# Patient Record
Sex: Male | Born: 1955 | Race: White | Hispanic: No | Smoking: Former smoker
Health system: Southern US, Community
[De-identification: ages and names within clinical notes are randomized; demographics above are authoritative.]

## PROBLEM LIST (undated history)

## (undated) ENCOUNTER — Inpatient Hospital Stay (HOSPITAL_BASED_OUTPATIENT_CLINIC_OR_DEPARTMENT_OTHER): Payer: Medicare Other | Admitting: Podiatry

## (undated) DIAGNOSIS — G8929 Other chronic pain: Secondary | ICD-10-CM

## (undated) DIAGNOSIS — Z87442 Personal history of urinary calculi: Secondary | ICD-10-CM

## (undated) DIAGNOSIS — Z227 Latent tuberculosis: Secondary | ICD-10-CM

## (undated) DIAGNOSIS — F32A Depression, unspecified: Secondary | ICD-10-CM

## (undated) DIAGNOSIS — F419 Anxiety disorder, unspecified: Secondary | ICD-10-CM

## (undated) DIAGNOSIS — Z9889 Other specified postprocedural states: Secondary | ICD-10-CM

## (undated) DIAGNOSIS — R4182 Altered mental status, unspecified: Secondary | ICD-10-CM

## (undated) DIAGNOSIS — T8859XA Other complications of anesthesia, initial encounter: Secondary | ICD-10-CM

## (undated) DIAGNOSIS — B1920 Unspecified viral hepatitis C without hepatic coma: Secondary | ICD-10-CM

## (undated) DIAGNOSIS — I10 Essential (primary) hypertension: Secondary | ICD-10-CM

## (undated) DIAGNOSIS — N200 Calculus of kidney: Secondary | ICD-10-CM

## (undated) DIAGNOSIS — E119 Type 2 diabetes mellitus without complications: Secondary | ICD-10-CM

## (undated) DIAGNOSIS — R112 Nausea with vomiting, unspecified: Secondary | ICD-10-CM

## (undated) DIAGNOSIS — K219 Gastro-esophageal reflux disease without esophagitis: Secondary | ICD-10-CM

## (undated) DIAGNOSIS — F1010 Alcohol abuse, uncomplicated: Secondary | ICD-10-CM

## (undated) DIAGNOSIS — M1990 Unspecified osteoarthritis, unspecified site: Secondary | ICD-10-CM

## (undated) DIAGNOSIS — D849 Immunodeficiency, unspecified: Secondary | ICD-10-CM

## (undated) HISTORY — PX: WRIST SURGERY: SHX841

## (undated) HISTORY — PX: EYE SURGERY: SHX253

## (undated) HISTORY — DX: Altered mental status, unspecified: R41.82

## (undated) HISTORY — PX: ABDOMINAL SURGERY: SHX537

## (undated) HISTORY — PX: NECK SURGERY: SHX720

## (undated) HISTORY — PX: CHOLECYSTECTOMY: SHX55

## (undated) HISTORY — PX: ELBOW SURGERY: SHX618

## (undated) HISTORY — PX: ANKLE SURGERY: SHX546

## (undated) HISTORY — PX: HERNIA REPAIR: SHX51

## (undated) NOTE — *Deleted (*Deleted)
NEUROLOGY CONSULTATION NOTE  Raymond Melton MRN: [id number] DOB: 10-17-1956  Referring provider: Angelina Pih, MD Primary care provider: Angelina Pih, MD  Reason for consult:  TIA, memory loss   Subjective:  Raymond Melton is a 19 year old right-handed male with hepatitis C, substance abuse, HTN and diabetes who presents for memory deficits and TIA.  History supplemented by prior neurology notes.  Longstanding history of alcohol and opiate abuse for which he has been through rehab.  He had C5-C7 cervical spine fusion in 2018.  He reports cognitive impairment following the surgery.  He has ***. He also reported intermittent episodes of confusion with associated slurred speech and gait instability, lasting anywhere from 2 hours to 2 days.  He is a diabetic and blood sugars reportedly normal during these events.  He has undergone several brain MRIs which have showed mild chronic small vessel ischemic changes but no acute intracranial abnormalities.  MRA of head and neck from 12/25/2017 personally reviewed was negative.  EEG from 01/06/2018 was normal.  Even though he had reported no benzodiazepine use, he apparently had continued to take Xanax.  His last neurologist determined that his episodic confusion was likely related to benzodiazepine use.       PAST MEDICAL HISTORY: Past Medical History:  Diagnosis Date  . Alcohol abuse   . Altered mental status 12/17/2017  . Chronic pain   . Diabetes mellitus without complication (HCC)   . Hepatitis C   . Hypertension   . Immune deficiency disorder (HCC)   . Kidney stones   . TB lung, latent     PAST SURGICAL HISTORY: Past Surgical History:  Procedure Laterality Date  . ABDOMINAL SURGERY    . ANKLE SURGERY    . CHOLECYSTECTOMY    . CORONARY STENT INTERVENTION N/A 02/05/2018   Procedure: CORONARY STENT INTERVENTION;  Surgeon: Lyn Records, MD;  Location: [facility] INVASIVE CV LAB;  Service: Cardiovascular;  Laterality: N/A;  . ELBOW SURGERY     . EYE SURGERY    . HERNIA REPAIR    . INTRAVASCULAR PRESSURE WIRE/FFR STUDY N/A 02/05/2018   Procedure: INTRAVASCULAR PRESSURE WIRE/FFR STUDY;  Surgeon: Lyn Records, MD;  Location: [facility] INVASIVE CV LAB;  Service: Cardiovascular;  Laterality: N/A;  . LEFT HEART CATH AND CORONARY ANGIOGRAPHY N/A 02/05/2018   Procedure: LEFT HEART CATH AND CORONARY ANGIOGRAPHY;  Surgeon: Lyn Records, MD;  Location: [facility] INVASIVE CV LAB;  Service: Cardiovascular;  Laterality: N/A;  . WRIST SURGERY      MEDICATIONS: Current Outpatient Medications on File Prior to Visit  Medication Sig Dispense Refill  . amLODipine (NORVASC) 10 MG tablet Take 10 mg by mouth daily.    . clopidogrel (PLAVIX) 75 MG tablet Take 1 tablet (75 mg total) by mouth daily. 90 tablet 3  . isosorbide mononitrate (IMDUR) 30 MG 24 hr tablet Take 1 tablet (30 mg total) by mouth daily. 90 tablet 3  . metoprolol succinate (TOPROL-XL) 100 MG 24 hr tablet Take 1 tablet (100 mg total) by mouth daily. Take with or immediately following a meal: For high blood pressure    . nitroGLYCERIN (NITROSTAT) 0.4 MG SL tablet Place 1 tablet (0.4 mg total) under the tongue every 5 (five) minutes x 3 doses as needed for chest pain. 30 tablet 12  . rosuvastatin (CRESTOR) 20 MG tablet Take 1 tablet (20 mg total) by mouth daily. 90 tablet 3   No current facility-administered medications on file prior to visit.  ALLERGIES: Allergies  Allergen Reactions  . Iodinated Diagnostic Agents Anaphylaxis and Swelling  . Iodine Anaphylaxis  . Ioxaglate Anaphylaxis and Swelling  . Metrizamide Anaphylaxis and Swelling  . Other Anaphylaxis, Shortness Of Breath and Swelling    Iv dye  . Codeine Itching and Other (See Comments)    Other reaction: restless  . Iohexol Other (See Comments)  . Latex Other (See Comments)    Unknown reaction  . Lisinopril Other (See Comments)    Unknown reaction  . Tuberculin Other (See Comments)    Other reaction: Severe skin reaction  .  Tuberculin Tests Other (See Comments)    Severe skin reaction  . Valsartan Other (See Comments)    Unknown reaction  . Zolpidem Other (See Comments)    confusion    FAMILY HISTORY: Family History  Problem Relation Age of Onset  . Depression Mother   . Hypertension Mother   . Leukemia Mother   . Stroke Father   . Hypertension Father   . Heart attack Father   . Cancer Sister   . Hypertension Sister   . Cancer Brother        bladder  . Hypertension Brother   . Heart attack Brother    ***.  SOCIAL HISTORY: Social History   Socioeconomic History  . Marital status: Legally Separated    Spouse name: Not on file  . Number of children: Not on file  . Years of education: Not on file  . Highest education level: Not on file  Occupational History  . Not on file  Tobacco Use  . Smoking status: Current Some Day Smoker    Packs/day: 0.50    Types: Cigarettes  . Smokeless tobacco: Never Used  Substance and Sexual Activity  . Alcohol use: Yes    Comment: daily  . Drug use: No  . Sexual activity: Not Currently  Other Topics Concern  . Not on file  Social History Narrative  . Not on file   Social Determinants of Health   Financial Resource Strain:   . Difficulty of Paying Living Expenses: Not on file  Food Insecurity:   . Worried About Programme researcher, broadcasting/film/video in the Last Year: Not on file  . Ran Out of Food in the Last Year: Not on file  Transportation Needs:   . Lack of Transportation (Medical): Not on file  . Lack of Transportation (Non-Medical): Not on file  Physical Activity:   . Days of Exercise per Week: Not on file  . Minutes of Exercise per Session: Not on file  Stress:   . Feeling of Stress : Not on file  Social Connections:   . Frequency of Communication with Friends and Family: Not on file  . Frequency of Social Gatherings with Friends and Family: Not on file  . Attends Religious Services: Not on file  . Active Member of Clubs or Organizations: Not on file   . Attends Banker Meetings: Not on file  . Marital Status: Not on file  Intimate Partner Violence:   . Fear of Current or Ex-Partner: Not on file  . Emotionally Abused: Not on file  . Physically Abused: Not on file  . Sexually Abused: Not on file    Objective:  *** General: No acute distress.  Patient appears well-groomed.   Head:  Normocephalic/atraumatic Eyes:  fundi examined but not visualized Neck: supple, no paraspinal tenderness, full range of motion Back: No paraspinal tenderness Heart: regular rate and rhythm Lungs: Clear  to auscultation bilaterally. Vascular: No carotid bruits. Neurological Exam: Mental status: alert and oriented to person, place, and time, recent and remote memory intact, fund of knowledge intact, attention and concentration intact, speech fluent and not dysarthric, language intact. Cranial nerves: CN I: not tested CN II: pupils equal, round and reactive to light, visual fields intact CN III, IV, VI:  full range of motion, no nystagmus, no ptosis CN V: facial sensation intact. CN VII: upper and lower face symmetric CN VIII: hearing intact CN IX, X: gag intact, uvula midline CN XI: sternocleidomastoid and trapezius muscles intact CN XII: tongue midline Bulk & Tone: normal, no fasciculations. Motor:  muscle strength 5/5 throughout Sensation:  Pinprick, temperature and vibratory sensation intact. Deep Tendon Reflexes:  2+ throughout,  toes downgoing.   Finger to nose testing:  Without dysmetria.   Heel to shin:  Without dysmetria.   Gait:  Normal station and stride.  Romberg negative.  Assessment/Plan:   ***    Thank you for allowing me to take part in the care of this patient.  Shon Millet, DO  CC: ***

---

## 2000-07-12 ENCOUNTER — Encounter: Payer: Self-pay | Admitting: Emergency Medicine

## 2000-07-12 ENCOUNTER — Emergency Department (HOSPITAL_COMMUNITY): Admission: EM | Admit: 2000-07-12 | Discharge: 2000-07-12 | Payer: Self-pay | Admitting: *Deleted

## 2000-07-30 ENCOUNTER — Ambulatory Visit (HOSPITAL_COMMUNITY): Admission: RE | Admit: 2000-07-30 | Discharge: 2000-07-30 | Payer: Self-pay | Admitting: Orthopedic Surgery

## 2000-07-30 ENCOUNTER — Encounter: Payer: Self-pay | Admitting: Orthopedic Surgery

## 2012-06-05 ENCOUNTER — Other Ambulatory Visit (HOSPITAL_COMMUNITY): Payer: Self-pay | Admitting: Sports Medicine

## 2012-06-05 DIAGNOSIS — M25571 Pain in right ankle and joints of right foot: Secondary | ICD-10-CM

## 2012-12-01 DIAGNOSIS — A199 Miliary tuberculosis, unspecified: Secondary | ICD-10-CM | POA: Insufficient documentation

## 2013-01-13 ENCOUNTER — Encounter (HOSPITAL_COMMUNITY): Payer: Self-pay | Admitting: Emergency Medicine

## 2013-01-13 ENCOUNTER — Emergency Department (HOSPITAL_COMMUNITY)
Admission: EM | Admit: 2013-01-13 | Discharge: 2013-01-13 | Disposition: A | Discharge status: Home / Self Care | Payer: BC Managed Care – PPO | Attending: Emergency Medicine | Admitting: Emergency Medicine

## 2013-01-13 DIAGNOSIS — Z87442 Personal history of urinary calculi: Secondary | ICD-10-CM | POA: Insufficient documentation

## 2013-01-13 DIAGNOSIS — Z8639 Personal history of other endocrine, nutritional and metabolic disease: Secondary | ICD-10-CM | POA: Insufficient documentation

## 2013-01-13 DIAGNOSIS — Z79899 Other long term (current) drug therapy: Secondary | ICD-10-CM | POA: Insufficient documentation

## 2013-01-13 DIAGNOSIS — F1990 Other psychoactive substance use, unspecified, uncomplicated: Secondary | ICD-10-CM

## 2013-01-13 DIAGNOSIS — I10 Essential (primary) hypertension: Secondary | ICD-10-CM | POA: Insufficient documentation

## 2013-01-13 DIAGNOSIS — F1910 Other psychoactive substance abuse, uncomplicated: Secondary | ICD-10-CM | POA: Insufficient documentation

## 2013-01-13 DIAGNOSIS — Z8619 Personal history of other infectious and parasitic diseases: Secondary | ICD-10-CM | POA: Insufficient documentation

## 2013-01-13 DIAGNOSIS — E119 Type 2 diabetes mellitus without complications: Secondary | ICD-10-CM | POA: Insufficient documentation

## 2013-01-13 DIAGNOSIS — Z87891 Personal history of nicotine dependence: Secondary | ICD-10-CM | POA: Insufficient documentation

## 2013-01-13 DIAGNOSIS — Z862 Personal history of diseases of the blood and blood-forming organs and certain disorders involving the immune mechanism: Secondary | ICD-10-CM | POA: Insufficient documentation

## 2013-01-13 DIAGNOSIS — Z8611 Personal history of tuberculosis: Secondary | ICD-10-CM | POA: Insufficient documentation

## 2013-01-13 DIAGNOSIS — F1010 Alcohol abuse, uncomplicated: Secondary | ICD-10-CM | POA: Insufficient documentation

## 2013-01-13 DIAGNOSIS — F329 Major depressive disorder, single episode, unspecified: Secondary | ICD-10-CM

## 2013-01-13 HISTORY — DX: Type 2 diabetes mellitus without complications: E11.9

## 2013-01-13 HISTORY — DX: Latent tuberculosis: Z22.7

## 2013-01-13 HISTORY — DX: Unspecified viral hepatitis C without hepatic coma: B19.20

## 2013-01-13 HISTORY — DX: Calculus of kidney: N20.0

## 2013-01-13 HISTORY — DX: Immunodeficiency, unspecified: D84.9

## 2013-01-13 HISTORY — DX: Essential (primary) hypertension: I10

## 2013-01-13 LAB — COMPREHENSIVE METABOLIC PANEL
ALT: 94 U/L — ABNORMAL HIGH (ref 0–53)
AST: 52 U/L — ABNORMAL HIGH (ref 0–37)
Albumin: 3.6 g/dL (ref 3.5–5.2)
Alkaline Phosphatase: 202 U/L — ABNORMAL HIGH (ref 39–117)
BUN: 20 mg/dL (ref 6–23)
CO2: 27 mEq/L (ref 19–32)
Calcium: 9.3 mg/dL (ref 8.4–10.5)
Chloride: 94 mEq/L — ABNORMAL LOW (ref 96–112)
Creatinine, Ser: 0.69 mg/dL (ref 0.50–1.35)
GFR calc Af Amer: >90 mL/min (ref >90)
GFR calc non Af Amer: >90 mL/min (ref >90)
Glucose, Bld: 260 mg/dL — ABNORMAL HIGH (ref 70–99)
Potassium: 4 mEq/L (ref 3.5–5.1)
Sodium: 133 mEq/L — ABNORMAL LOW (ref 135–145)
Total Bilirubin: 0.7 mg/dL (ref 0.3–1.2)
Total Protein: 7.2 g/dL (ref 6.0–8.3)

## 2013-01-13 LAB — CBC
HCT: 44.7 % (ref 39.0–52.0)
Hemoglobin: 15.8 g/dL (ref 13.0–17.0)
MCH: 33.3 pg (ref 26.0–34.0)
MCHC: 35.3 g/dL (ref 30.0–36.0)
MCV: 94.3 fL (ref 78.0–100.0)
Platelets: 244 10*3/uL (ref 150–400)
RBC: 4.74 MIL/uL (ref 4.22–5.81)
RDW: 12.4 % (ref 11.5–15.5)
WBC: 8.7 10*3/uL (ref 4.0–10.5)

## 2013-01-13 LAB — RAPID URINE DRUG SCREEN, HOSP PERFORMED
Amphetamines: NOT DETECTED
Barbiturates: NOT DETECTED
Benzodiazepines: NOT DETECTED
Cocaine: NOT DETECTED
Opiates: NOT DETECTED
Tetrahydrocannabinol: NOT DETECTED

## 2013-01-13 LAB — ETHANOL: Alcohol, Ethyl (B): <11 mg/dL (ref 0–11)

## 2013-01-13 LAB — SALICYLATE LEVEL: Salicylate Lvl: <2 mg/dL — ABNORMAL LOW (ref 2.8–20.0)

## 2013-01-13 LAB — ACETAMINOPHEN LEVEL: Acetaminophen (Tylenol), Serum: <15 ug/mL (ref 10–30)

## 2013-01-13 MED ORDER — IBUPROFEN 600 MG PO TABS: 600.0000 mg | ORAL_TABLET | Freq: Three times a day (TID) | ORAL | Status: DC | PRN

## 2013-01-13 MED ORDER — VITAMIN B-1 100 MG PO TABS
100.0000 mg | ORAL_TABLET | Freq: Every day | ORAL | Status: DC
  Administered 2013-01-13: 100 mg via ORAL
  Filled 2013-01-13: qty 1

## 2013-01-13 MED ORDER — ALUM & MAG HYDROXIDE-SIMETH 200-200-20 MG/5ML PO SUSP: 30.0000 mL | ORAL | Status: DC | PRN

## 2013-01-13 MED ORDER — FOLIC ACID 1 MG PO TABS
1.0000 mg | ORAL_TABLET | Freq: Every day | ORAL | Status: DC
  Administered 2013-01-13: 1 mg via ORAL
  Filled 2013-01-13: qty 1

## 2013-01-13 MED ORDER — ADULT MULTIVITAMIN W/MINERALS CH
1.0000 | ORAL_TABLET | Freq: Every day | ORAL | Status: DC
  Administered 2013-01-13: 1 via ORAL
  Filled 2013-01-13: qty 1

## 2013-01-13 MED ORDER — LORAZEPAM 1 MG PO TABS: 1.0000 mg | ORAL_TABLET | Freq: Four times a day (QID) | ORAL | Status: DC | PRN

## 2013-01-13 MED ORDER — ZOLPIDEM TARTRATE 5 MG PO TABS: 5.0000 mg | ORAL_TABLET | Freq: Every evening | ORAL | Status: DC | PRN

## 2013-01-13 MED ORDER — LORAZEPAM 2 MG/ML IJ SOLN: 1.0000 mg | Freq: Four times a day (QID) | INTRAMUSCULAR | Status: DC | PRN

## 2013-01-13 MED ORDER — ONDANSETRON HCL 4 MG PO TABS: 4.0000 mg | ORAL_TABLET | Freq: Three times a day (TID) | ORAL | Status: DC | PRN

## 2013-01-13 MED ORDER — THIAMINE HCL 100 MG/ML IJ SOLN: 100.0000 mg | Freq: Every day | INTRAMUSCULAR | Status: DC

## 2013-01-13 NOTE — [facility] Suicide Risk Assessment (Signed)
Suicide Risk Assessment  Discharge Assessment     Demographic Factors:  Male, Adolescent or young adult and Caucasian  Mental Status Per Nursing Assessment::   On Admission:     Current Mental Status by Physician: NA  Loss Factors: Financial problems/change in socioeconomic status  Historical Factors: Family history of mental illness or substance abuse and Impulsivity  Risk Reduction Factors:   Sense of responsibility to family, Religious beliefs about death, Employed, Living with another person, especially a relative, Positive social support and Positive therapeutic relationship  Continued Clinical Symptoms:  Alcohol/Substance Abuse/Dependencies  Cognitive Features That Contribute To Risk:  Polarized thinking    Suicide Risk:  Minimal: No identifiable suicidal ideation.  Patients presenting with no risk factors but with morbid ruminations; may be classified as minimal risk based on the severity of the depressive symptoms  Discharge Diagnoses:   AXIS I:  Alcohol Abuse and Depressive Disorder NOS AXIS II:  Deferred AXIS III:   Past Medical History  Diagnosis Date  . Hepatitis C   . Diabetes mellitus without complication   . Hypertension   . TB lung, latent   . Kidney stones   . Immune deficiency disorder    AXIS IV:  other psychosocial or environmental problems and problems related to social environment AXIS V:  41-50 serious symptoms  Plan Of Care/Follow-up recommendations:  Activity:  As  tolerated Diet:  Regular  Is patient on multiple antipsychotic therapies at discharge:  No   Has Patient had three or more failed trials of antipsychotic monotherapy by history:  No  Recommended Plan for Multiple Antipsychotic Therapies: Not applicable  Raymond Melton,Raymond R. 01/13/2013, 4:52 PM
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
## 2013-01-13 NOTE — ED Notes (Signed)
States that he needs detox from alcohol and prescription medication. Wife states that he is taking whatever he can and it is mostly Ativan. Wife states that he drinks from the time he gets home from work until whenever he stops. States that he drinks beer and vodka. Pt states that he takes the Ativan to keep from going through the withdraw states that he has taken more then 2 Ativan 2mg . States that he also abuses OTC medications as well. States that he takes anything with "PM" in it.

## 2013-02-15 ENCOUNTER — Emergency Department (HOSPITAL_COMMUNITY)
Admission: EM | Admit: 2013-02-15 | Discharge: 2013-02-15 | Disposition: A | Discharge status: Home / Self Care | Payer: BC Managed Care – PPO | Attending: Emergency Medicine | Admitting: Emergency Medicine

## 2013-02-15 ENCOUNTER — Emergency Department (HOSPITAL_COMMUNITY): Payer: BC Managed Care – PPO

## 2013-02-15 ENCOUNTER — Encounter (HOSPITAL_COMMUNITY): Payer: Self-pay | Admitting: *Deleted

## 2013-02-15 DIAGNOSIS — E875 Hyperkalemia: Secondary | ICD-10-CM | POA: Insufficient documentation

## 2013-02-15 DIAGNOSIS — Z8639 Personal history of other endocrine, nutritional and metabolic disease: Secondary | ICD-10-CM | POA: Insufficient documentation

## 2013-02-15 DIAGNOSIS — Z8619 Personal history of other infectious and parasitic diseases: Secondary | ICD-10-CM | POA: Insufficient documentation

## 2013-02-15 DIAGNOSIS — R1011 Right upper quadrant pain: Secondary | ICD-10-CM | POA: Insufficient documentation

## 2013-02-15 DIAGNOSIS — I10 Essential (primary) hypertension: Secondary | ICD-10-CM | POA: Insufficient documentation

## 2013-02-15 DIAGNOSIS — Z862 Personal history of diseases of the blood and blood-forming organs and certain disorders involving the immune mechanism: Secondary | ICD-10-CM | POA: Insufficient documentation

## 2013-02-15 DIAGNOSIS — Z87442 Personal history of urinary calculi: Secondary | ICD-10-CM | POA: Insufficient documentation

## 2013-02-15 DIAGNOSIS — E119 Type 2 diabetes mellitus without complications: Secondary | ICD-10-CM | POA: Insufficient documentation

## 2013-02-15 DIAGNOSIS — Z9089 Acquired absence of other organs: Secondary | ICD-10-CM | POA: Insufficient documentation

## 2013-02-15 DIAGNOSIS — Z794 Long term (current) use of insulin: Secondary | ICD-10-CM | POA: Insufficient documentation

## 2013-02-15 DIAGNOSIS — Z79899 Other long term (current) drug therapy: Secondary | ICD-10-CM | POA: Insufficient documentation

## 2013-02-15 DIAGNOSIS — Z87891 Personal history of nicotine dependence: Secondary | ICD-10-CM | POA: Insufficient documentation

## 2013-02-15 HISTORY — DX: Alcohol abuse, uncomplicated: F10.10

## 2013-02-15 LAB — POCT I-STAT, CHEM 8
Calcium, Ion: 1.2 mmol/L (ref 1.12–1.23)
Glucose, Bld: 219 mg/dL — ABNORMAL HIGH (ref 70–99)
HCT: 45 % (ref 39.0–52.0)
Hemoglobin: 15.3 g/dL (ref 13.0–17.0)
Potassium: 5.9 mEq/L — ABNORMAL HIGH (ref 3.5–5.1)
TCO2: 31 mmol/L (ref 0–100)

## 2013-02-15 LAB — URINALYSIS, ROUTINE W REFLEX MICROSCOPIC
Leukocytes, UA: NEGATIVE
Protein, ur: NEGATIVE mg/dL
Urobilinogen, UA: 0.2 mg/dL (ref 0.0–1.0)

## 2013-02-15 LAB — COMPREHENSIVE METABOLIC PANEL
ALT: 60 U/L — ABNORMAL HIGH (ref 0–53)
AST: 73 U/L — ABNORMAL HIGH (ref 0–37)
CO2: 27 mEq/L (ref 19–32)
Calcium: 9.7 mg/dL (ref 8.4–10.5)
Chloride: 98 mEq/L (ref 96–112)
GFR calc non Af Amer: >90 mL/min (ref >90)
Sodium: 134 mEq/L — ABNORMAL LOW (ref 135–145)

## 2013-02-15 LAB — CBC WITH DIFFERENTIAL/PLATELET
Basophils Absolute: 0 10*3/uL (ref 0.0–0.1)
Eosinophils Relative: 2 % (ref 0–5)
Lymphocytes Relative: 29 % (ref 12–46)
Neutro Abs: 2.8 10*3/uL (ref 1.7–7.7)
Neutrophils Relative %: 60 % (ref 43–77)
Platelets: 177 10*3/uL (ref 150–400)
RDW: 12.5 % (ref 11.5–15.5)
WBC: 4.7 10*3/uL (ref 4.0–10.5)

## 2013-02-15 LAB — URINE MICROSCOPIC-ADD ON

## 2013-02-15 LAB — GLUCOSE, CAPILLARY
Glucose-Capillary: 254 mg/dL — ABNORMAL HIGH (ref 70–99)
Glucose-Capillary: 181 mg/dL — ABNORMAL HIGH (ref 70–99)

## 2013-02-15 MED ORDER — KETAMINE HCL 10 MG/ML IJ SOLN
9.0000 mg | Freq: Once | INTRAMUSCULAR | Status: AC
  Administered 2013-02-15: 9 mg via INTRAVENOUS

## 2013-02-15 MED ORDER — TRAMADOL HCL 50 MG PO TABS: 50.0000 mg | ORAL_TABLET | Freq: Three times a day (TID) | ORAL | Status: DC | PRN

## 2013-02-15 MED ORDER — GI COCKTAIL ~~LOC~~
30.0000 mL | Freq: Once | ORAL | Status: AC
  Administered 2013-02-15: 30 mL via ORAL
  Filled 2013-02-15: qty 30

## 2013-02-15 MED ORDER — ONDANSETRON HCL 4 MG/2ML IJ SOLN
4.0000 mg | Freq: Once | INTRAMUSCULAR | Status: AC
  Administered 2013-02-15: 4 mg via INTRAVENOUS
  Filled 2013-02-15: qty 2

## 2013-02-15 MED ORDER — KETAMINE HCL 10 MG/ML IJ SOLN
13.0000 mg | Freq: Once | INTRAMUSCULAR | Status: AC
  Administered 2013-02-15: 13 mg via INTRAVENOUS

## 2013-02-15 MED ORDER — HYDROMORPHONE HCL PF 1 MG/ML IJ SOLN: 1.0000 mg | INTRAMUSCULAR | Status: DC

## 2013-02-15 MED ORDER — INSULIN ASPART 100 UNIT/ML ~~LOC~~ SOLN
5.0000 [IU] | Freq: Once | SUBCUTANEOUS | Status: AC
  Administered 2013-02-15: 5 [IU] via INTRAVENOUS
  Filled 2013-02-15: qty 1

## 2013-02-15 MED ORDER — TRAMADOL HCL 50 MG PO TABS
100.0000 mg | ORAL_TABLET | Freq: Once | ORAL | Status: AC
  Administered 2013-02-15: 100 mg via ORAL
  Filled 2013-02-15: qty 2

## 2013-02-15 MED ORDER — KETAMINE HCL 10 MG/ML IJ SOLN
9.0000 mg | Freq: Once | INTRAMUSCULAR | Status: AC
  Administered 2013-02-15: 9 mg via INTRAVENOUS
  Filled 2013-02-15: qty 0.9

## 2013-02-15 MED ORDER — LACTATED RINGERS IV BOLUS (SEPSIS)
1000.0000 mL | Freq: Once | INTRAVENOUS | Status: AC
  Administered 2013-02-15: 1000 mL via INTRAVENOUS

## 2013-02-15 NOTE — ED Notes (Signed)
EKG shown to Dr. Denton Lank

## 2013-02-15 NOTE — ED Provider Notes (Signed)
History     CSN: [id number]  Arrival date & time 02/15/13  0032   First MD Initiated Contact with Patient 02/15/13 [phone]      Chief Complaint  Patient presents with  . Abdominal Pain    (Consider location/radiation/quality/duration/timing/severity/associated sxs/prior treatment) HPI Raymond Melton is a 57 y.o. male who was recently seen at [facility] long for benzodiazepine abuse and alcohol abuse, admitted for detox. Patient is currently living at [facility] group home to help him manage his addiction, and new onset diabetes.  Presents with couple days history of worsening right upper quadrant pain, he is status post cholecystectomy, is severe, stabbing, fairly constant but does occasionally get worse, no association with eating, no nausea vomiting or diarrhea, no fevers or chills, no melena or hematochezia, no changes in bowel habits. Patient denies drinking alcohol or using illicit drugs at this time  Past Medical History  Diagnosis Date  . Hepatitis C   . Diabetes mellitus without complication   . Hypertension   . TB lung, latent   . Kidney stones   . Immune deficiency disorder   . Alcohol abuse     Past Surgical History  Procedure Laterality Date  . Ankle surgery    . Hernia repair    . Cholecystectomy    . Eye surgery    . Abdominal surgery    . Wrist surgery    . Elbow surgery      History reviewed. No pertinent family history.  History  Substance Use Topics  . Smoking status: Former Smoker    Types: Cigarettes  . Smokeless tobacco: Not on file  . Alcohol Use: Yes     Comment: daily      Review of Systems At least 10pt or greater review of systems completed and are negative except where specified in the HPI.  Allergies  Contrast media; Codeine; and Tuberculin tests  Home Medications   Current Outpatient Rx  Name  Route  Sig  Dispense  Refill  . acetaminophen (TYLENOL) 500 MG tablet   Oral   Take 500 mg by mouth every 6 (six) hours as needed for  pain or fever.         [name] alum & mag hydroxide-simeth (MAALOX/MYLANTA) 200-200-20 MG/5ML suspension   Oral   Take 10-20 mLs by mouth every 6 (six) hours as needed for indigestion.         [name] anti-nausea (EMETROL) solution   Oral   Take 15-30 mLs by mouth every 15 (fifteen) minutes as needed for nausea.         . Benzocaine 10 MG LOZG   Mouth/Throat   Use as directed 1 lozenge in the mouth or throat every 4 (four) hours as needed (sore throat).         . benzonatate (TESSALON) 100 MG capsule   Oral   Take 200 mg by mouth 3 (three) times daily as needed for cough.         . cetirizine (ZYRTEC) 10 MG tablet   Oral   Take 10 mg by mouth daily as needed for allergies.         . diazepam (VALIUM) 5 MG/ML solution   Intramuscular   Inject 10 mg into the muscle once as needed (STAT for seizures).         [name] guaiFENesin (MUCINEX) 600 MG 12 hr tablet   Oral   Take 600 mg by mouth 2 (two) times daily.         [name]  ibuprofen (ADVIL,MOTRIN) 600 MG tablet   Oral   Take 600 mg by mouth every 6 (six) hours as needed for pain.         [name] insulin aspart (NOVOLOG) 100 UNIT/ML injection   Subcutaneous   Inject 2-10 Units into the skin 3 (three) times daily with meals as needed for high blood sugar (based on sliding scale: 200 or below 0 units, 201-250 2 units, 251-300 4 units, 301-350 6 units, 351-400 8 units, above 400 10 units and call MD).         [name] insulin glargine (LANTUS) 100 UNIT/ML injection   Subcutaneous   Inject 15 Units into the skin at bedtime.         [name] lisinopril (PRINIVIL,ZESTRIL) 10 MG tablet   Oral   Take 10 mg by mouth daily.         . Loperamide HCl (IMODIUM A-D PO)   Oral   Take 1-2 tablets by mouth See admin instructions. 2 tablets at onset of diarrhea, then 1 tablet following each diarrhea stool. Max 8 tablets per day.         . metoprolol succinate (TOPROL-XL) 100 MG 24 hr tablet   Oral   Take 100 mg by mouth daily. Take with or immediately  following a meal.         . Multiple Vitamin (MULTIVITAMIN WITH MINERALS) TABS   Oral   Take 1 tablet by mouth daily.         . ondansetron (ZOFRAN) 8 MG tablet   Oral   Take 8 mg by mouth 2 (two) times daily as needed for nausea.         [name] OVER THE COUNTER MEDICATION   Oral   Take 1 tablet by mouth See admin instructions. No frequency-Alert MD after 4 consecutive stools         . polyethylene glycol (MIRALAX / GLYCOLAX) packet   Oral   Take 17 g by mouth daily.         [name] thiamine (VITAMIN B-1) 100 MG tablet   Oral   Take 100 mg by mouth daily.           BP 132/88  Pulse 71  Temp(Src) 97.4 F (36.3 C) (Oral)  Resp 16  SpO2 96%  Physical Exam  Nursing notes reviewed.  Electronic medical record reviewed. VITAL SIGNS:   Filed Vitals:   02/15/13 0443 02/15/13 0700 02/15/13 0748 02/15/13 0758  BP: 130/96 134/74 127/78 127/78  Pulse: 70 68 67   Temp:      TempSrc:      Resp:   16 18  SpO2: 95% 97% 96% 98%   CONSTITUTIONAL: Awake, oriented, appears non-toxic HENT: Atraumatic, normocephalic, oral mucosa pink and moist, airway patent. Nares patent without drainage. External ears normal. 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 Symmetrical breath sounds. Non-tender. ABDOMINAL: Non-distended, soft, tenderness to palpation in the right upper quadrant without no rebound or guarding.  BS normal. NEUROLOGIC: Non-focal, moving all four extremities, no gross sensory or motor deficits. EXTREMITIES: No clubbing, cyanosis, or edema SKIN: Warm, Dry, No erythema, No rash  ED Course  Procedures (including critical care time)  Labs Reviewed  URINALYSIS, ROUTINE W REFLEX MICROSCOPIC - Abnormal; Notable for the  following:  APPearance CLOUDY (*)    Glucose, UA >1000 (*)    All other components within normal limits  CBC WITH DIFFERENTIAL - Abnormal; Notable for the following:    MCHC 36.4 (*)    All other components within normal limits  COMPREHENSIVE METABOLIC PANEL - Abnormal; Notable for the following:    Sodium 134 (*)    Potassium 5.5 (*)    Glucose, Bld 273 (*)    AST 73 (*)    ALT 60 (*)    Alkaline Phosphatase 187 (*)    All other components within normal limits  LIPASE, BLOOD - Abnormal; Notable for the following:    Lipase 76 (*)    All other components within normal limits  GLUCOSE, CAPILLARY - Abnormal; Notable for the following:    Glucose-Capillary 254 (*)    All other components within normal limits  URINE MICROSCOPIC-ADD ON  ETHANOL   Ct Abdomen Pelvis Wo Contrast  02/15/2013  *RADIOLOGY REPORT*  Clinical Data: Right upper quadrant pain with nausea.  CT ABDOMEN AND PELVIS WITHOUT CONTRAST  Technique:  Multidetector CT imaging of the abdomen and pelvis was performed following the standard protocol without intravenous contrast.  Comparison: 12/18/2008  Findings: Compressive atelectasis is seen in the dependent lung bases bilaterally.  No focal abnormalities seen in the liver or spleen on this study performed without intravenous contrast material.  The stomach is unremarkable.  Calcified lymph nodes in the gastrohepatic ligament are unchanged, consistent with benign etiology.  The duodenum, pancreas, and adrenal glands are unremarkable.  The gallbladder is surgically absent.  13 mm low density lesion in the interpolar right kidney is unchanged, likely a cyst.  2 mm nonobstructing stone is identified in the interpolar left kidney.  No abdominal aortic aneurysm.  There is no free fluid or lymphadenopathy in the abdomen.  The abdominal bowel loops are normal in appearance.  Imaging through the pelvis shows no free intraperitoneal fluid.  No pelvic sidewall lymphadenopathy.  Bladder is  unremarkable.  Small bilateral inguinal hernias, right greater than left, contain only fat.  A few scattered diverticuli are seen in the left colon without diverticulitis. The terminal ileum and the appendix are normal.  Bone windows reveal no worrisome lytic or sclerotic osseous lesions.  IMPRESSION: No CT findings to explain the patient's history of right upper quadrant pain and nausea.  Interval cholecystectomy.  2 mm nonobstructing left renal stone.   Original Report Authenticated By: Kennith Center, M.D.    Dg Chest Port 1 View  02/15/2013  *RADIOLOGY REPORT*  Clinical Data: Moderate pain.  Cholecystectomy.  PORTABLE CHEST - 1 VIEW  Comparison: 01/26/2013.  Findings: Reduced lung volumes compared to prior.  No airspace disease.  Mild basilar atelectasis. Monitoring leads are projected over the chest.  Cardiopericardial silhouette appears within normal limits.  IMPRESSION:   No acute cardiopulmonary disease.  Basilar atelectasis.   Original Report Authenticated By: Andreas Newport, M.D.      1. Right upper quadrant pain       MDM  Patient history of alcohol abuse Ativan abuse presents with right upper quadrant pain. Patient does have a history of diabetes - initial comprehensive metabolic panel shows some mild hypokalemia - potassium of 5.5, this is hemolyzed sample. In addition patient has glucose of 273. Patient is not acidotic does not have any gap.  Do not think the abdominal pain is caused by DKA, symptoms not consistent with HHNC.  Lipase elevated, - not greatly, but w/ h/o of EtOH,  would check CT looking for pancreatitis vs. Pseudocyst, .Ductal dilation or biliary disease. Also doubt retained stone causing gallstone pancreatitis. Patient has requested we do not give narcotics secondary to his [facility] residency. His pain is well-controlled with  Low-dose ketamine.  Followup i-STAT also shows some mild hyperkalemia with potassium 5.9, question this result is patient's renal function is  normal, he has a history of chronic hyperkalemia, no peaked T waves, his QRS is not prolonged and his EKGs unremarkable. We'll treat the patient's mild hyperglycemia as he is concerned about it. CT of the abdomen and pelvis is unremarkable only showing a 2 cm nonobstructing stone in the left kidney. No pancreatitis seen, no acute abdominal process at this time. Patient's pain is well managed, it is minimal at this time - discharged home stable and good condition. Will followup with [facility] in [place].  With normal renal function, I think treating hyperkalemia is simply treating a number, pt may have slightly elevated K at baseline. I do not think kayexalate treatment is indicated for his home use as it it minimally effective and potentially dangerous.    I explained the diagnosis and have given explicit precautions to return to the ER including any other new or worsening symptoms. The patient understands and accepts the medical plan as it's been dictated and I have answered their questions. Discharge instructions concerning home care and prescriptions have been given.  The patient is STABLE and is discharged to home in good condition.          Jones Skene, MD 02/15/13 2130

## 2013-02-15 NOTE — ED Notes (Signed)
Pt state that he is having right upper abdominal pain. Pt from [facility]. Pt states that he has not had a drink since 3 weeks when he went to [facility]. Pt states that his pain increases with movement. Pt denies problems with bowels or urine.

## 2013-02-15 NOTE — ED Notes (Signed)
Raymond Melton is his ride. Call him at [facility] [phone]

## 2013-02-15 NOTE — ED Notes (Signed)
CT notified pt was finished with contrast; can not scan until [phone]

## 2013-02-15 NOTE — ED Notes (Signed)
CBG checked 254

## 2013-02-15 NOTE — ED Notes (Addendum)
DR. Rulon Abide ADMINISTERED ADDITIONAL 13MG  OF KETAMINE. DR. Rulon Abide TO PASS OFF BOTTLE OF KETAMINE TO DR. Arizona Constable

## 2013-02-15 NOTE — ED Notes (Signed)
Patient said he started having pain three weeks ago.  The patient said he has been to his regular doctor and they send him home.  He has been told maybe it is muscle pain, or withdrawals from Benzodiazepines or a cracked ribs.  The patient says his pain is so severe he decided to come to the ED.  The patient is Hep C positive and a diabetic and feels it is his liver or his pancreas causing this pain.

## 2013-02-15 NOTE — ED Notes (Signed)
Dr. Rulon Abide back at the bedside with the patient.

## 2013-02-15 NOTE — ED Notes (Signed)
[facility] phone number for when pt needs to be picked up [phone]

## 2013-02-15 NOTE — ED Notes (Signed)
CBG of 181. 

## 2013-06-26 DIAGNOSIS — R1011 Right upper quadrant pain: Secondary | ICD-10-CM | POA: Insufficient documentation

## 2014-04-24 IMAGING — CR DG CHEST 1V PORT
1 series · 1 of 1 positions shown · non-contrast
Comparison: 01/26/2013.

CLINICAL DATA: Moderate pain.  Cholecystectomy.

PORTABLE CHEST - 1 VIEW

[AP]
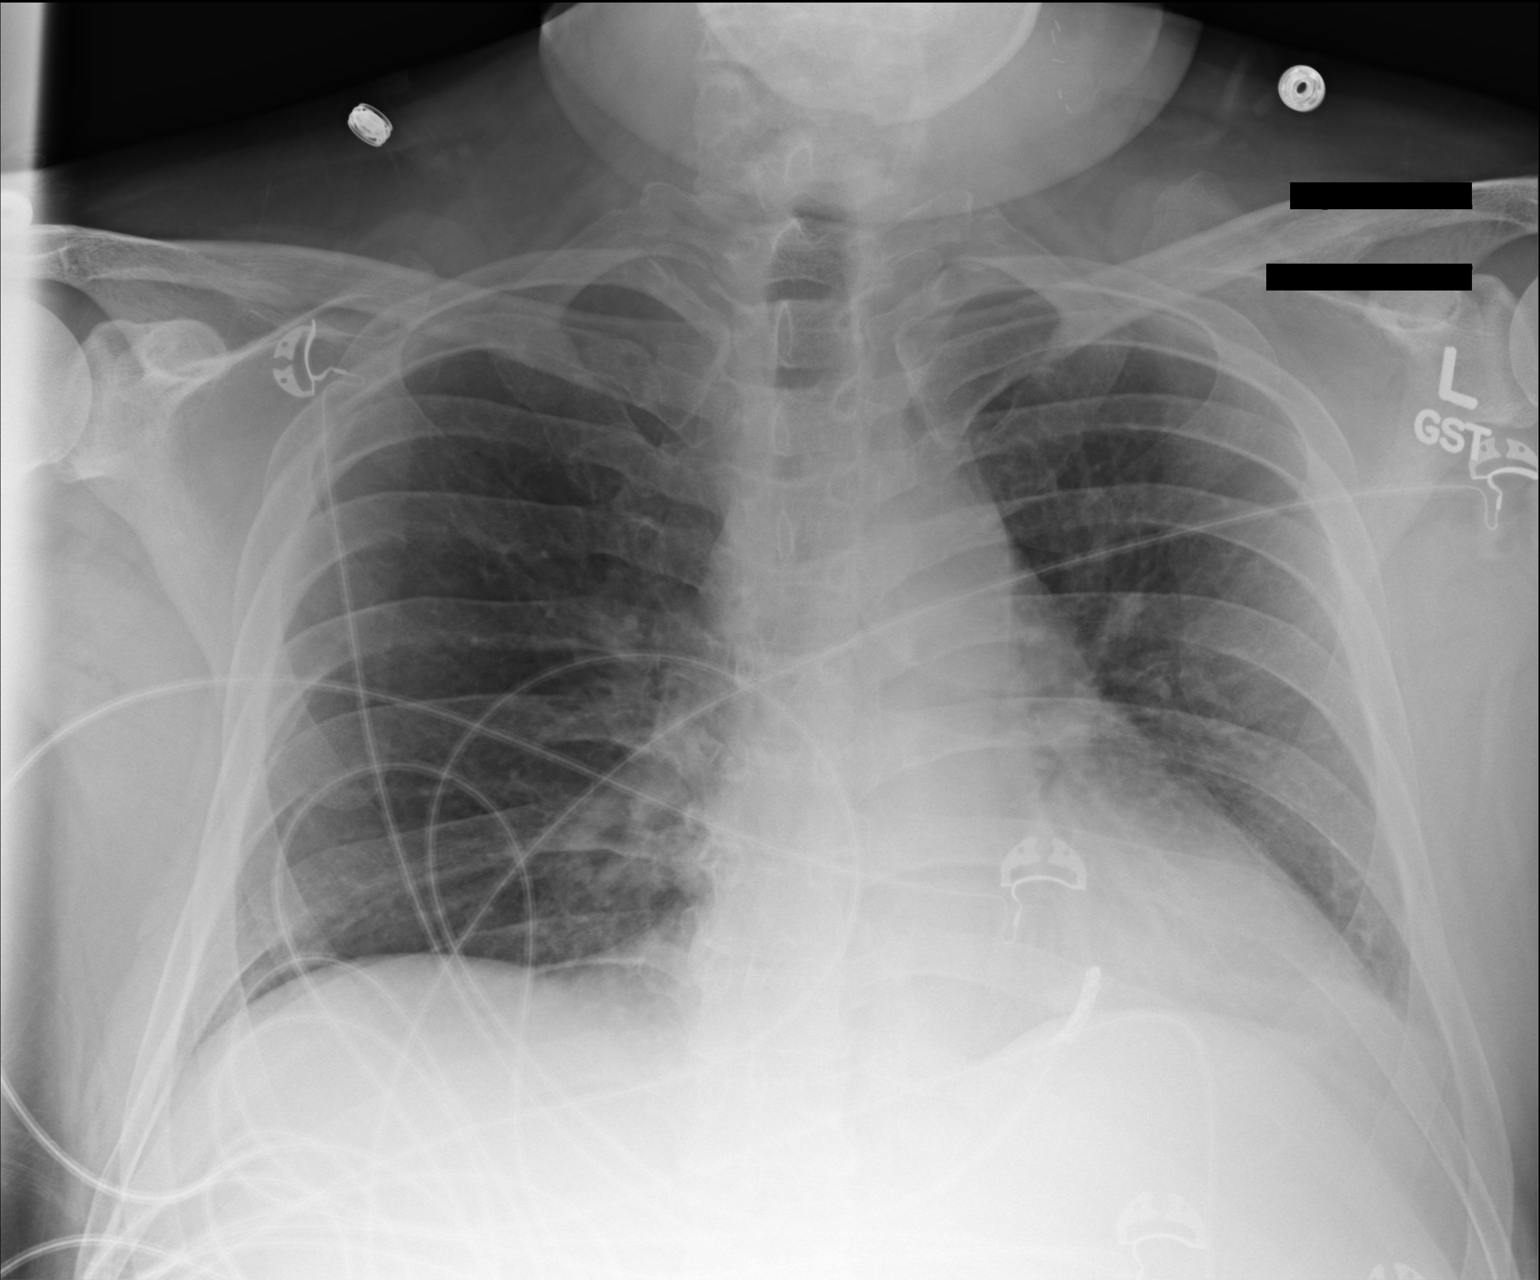

[1 of 1 positions shown; findings below may reference images not displayed]

FINDINGS: Reduced lung volumes compared to prior.  No airspace
disease.  Mild basilar atelectasis. Monitoring leads are projected
over the chest.  Cardiopericardial silhouette appears within normal
limits.
IMPRESSION: No acute cardiopulmonary disease.  Basilar atelectasis.

## 2014-04-24 IMAGING — CT CT ABD-PELV W/O CM
2 of 4 series · 14 of 32 positions shown, 19 images · IV contrast (READICAT)
Comparison: 12/18/2008

CLINICAL DATA: Right upper quadrant pain with nausea.

CT ABDOMEN AND PELVIS WITHOUT CONTRAST
TECHNIQUE: Multidetector CT imaging of the abdomen and pelvis was
performed following the standard protocol without intravenous
contrast.

[Series 2: routine abdomen · axial · 0.88mm/px · z∈[-524,-178]mm · 6 of 97 slices shown, 11 images]
[im 14/97  soft-tissue]
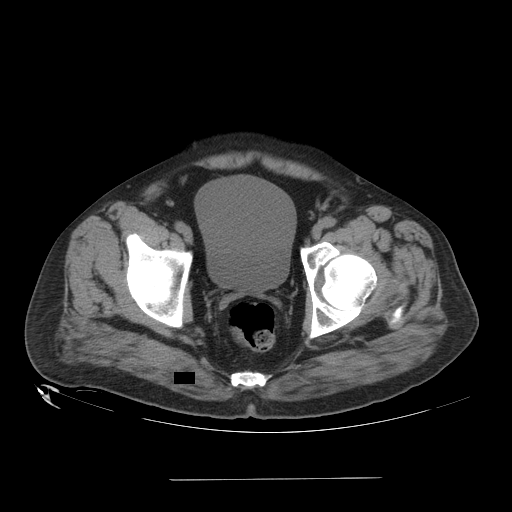
[im 14/97  bone]
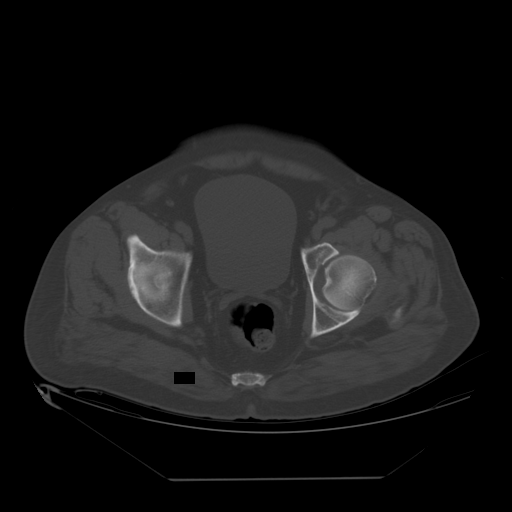
[im 28/97  soft-tissue]
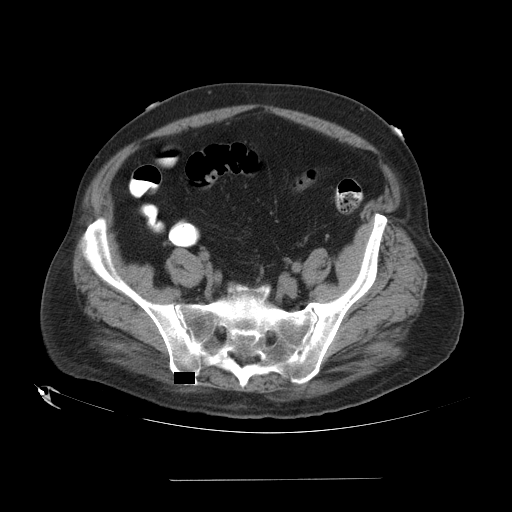
[im 42/97  soft-tissue]
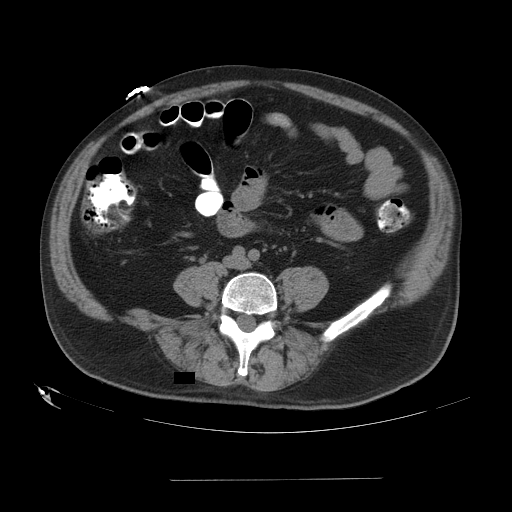
[im 42/97  lung]
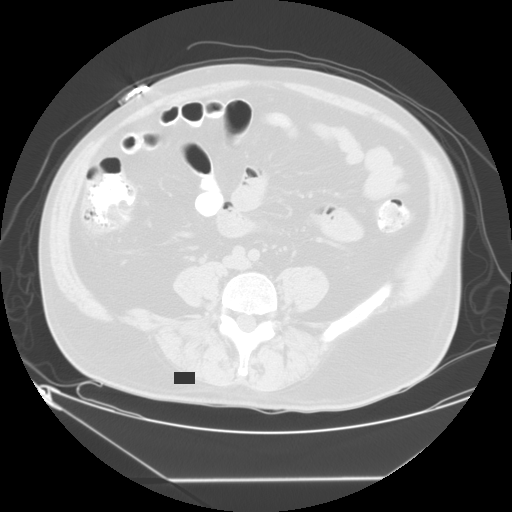
[im 55/97  soft-tissue]
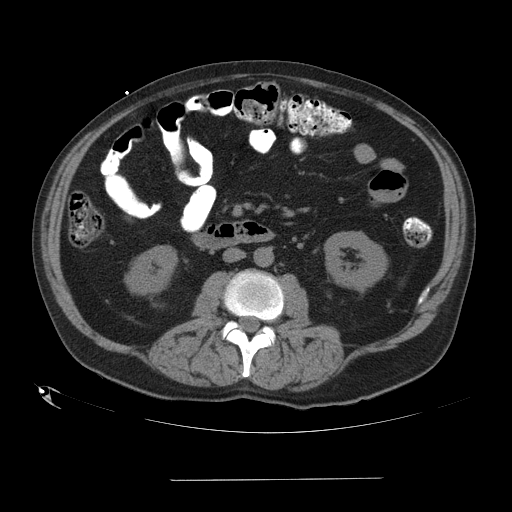
[im 55/97  lung]
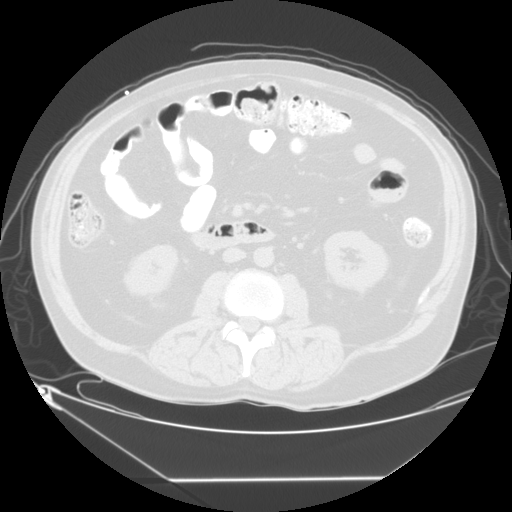
[im 69/97  soft-tissue]
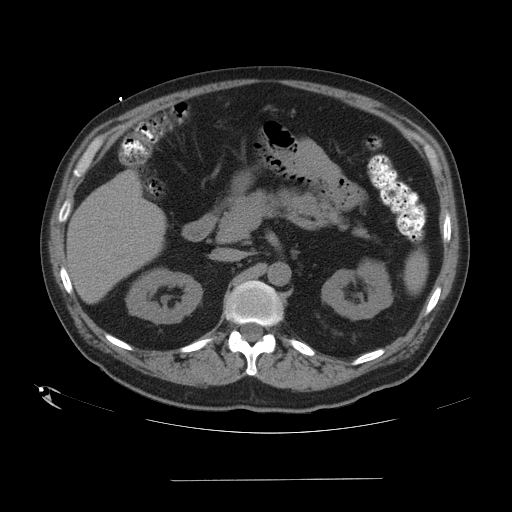
[im 69/97  lung]
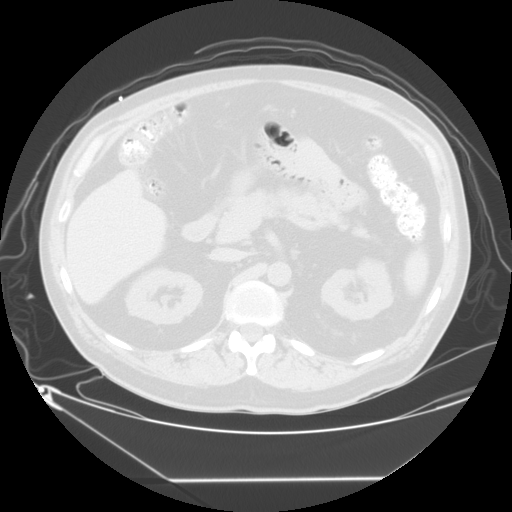
[im 83/97  soft-tissue]
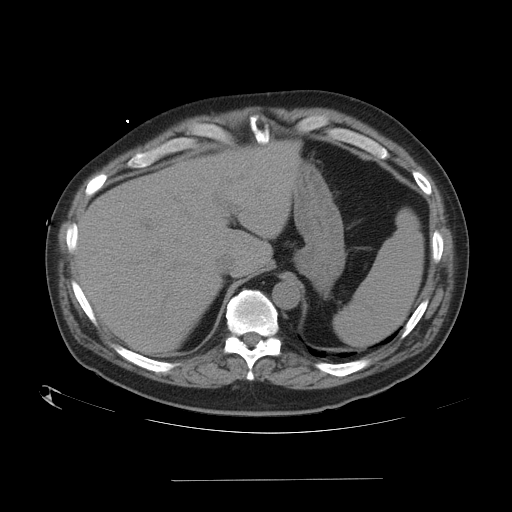
[im 83/97  lung]
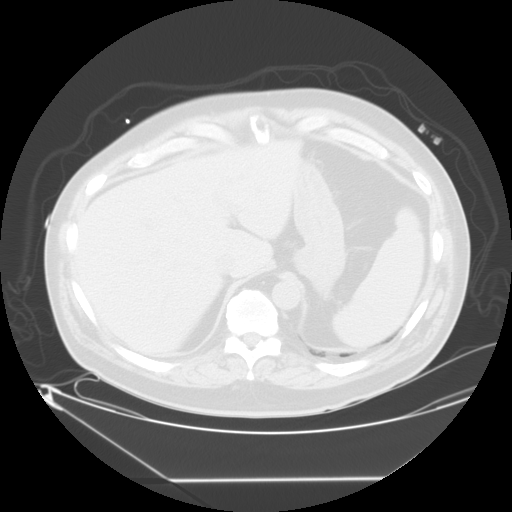

[Series 400: sagittal · sagittal · 0.96mm/px · 8 of 127 slices shown]
[im 12/127  soft-tissue]
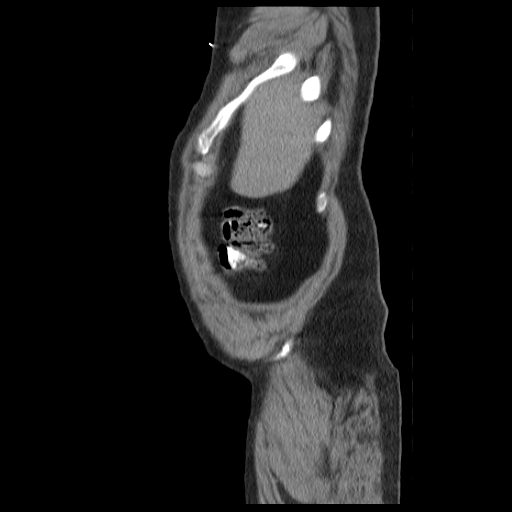
[im 23/127  soft-tissue]
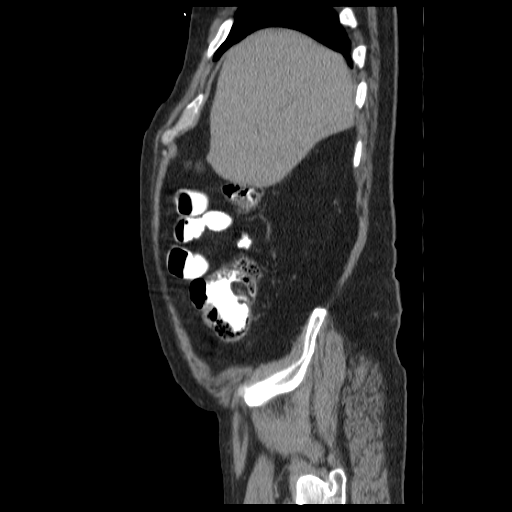
[im 46/127  soft-tissue]
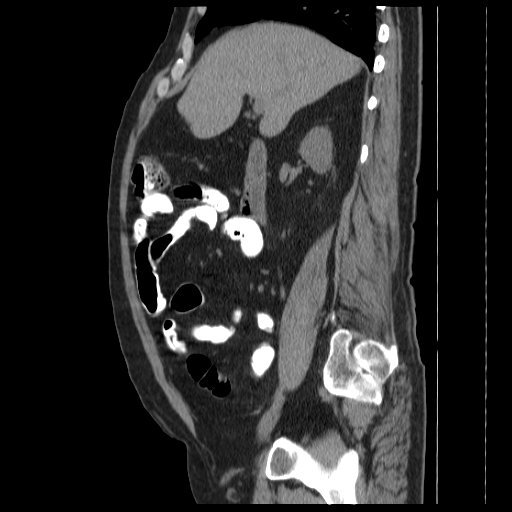
[im 58/127  soft-tissue]
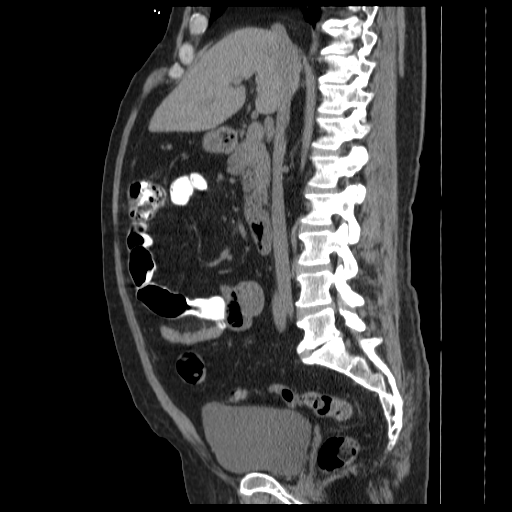
[im 69/127  soft-tissue]
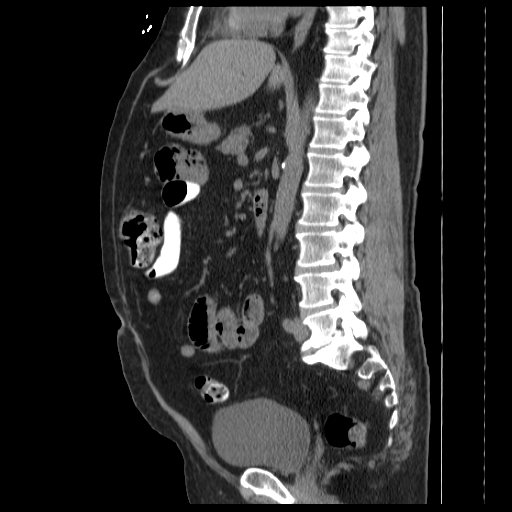
[im 81/127  soft-tissue]
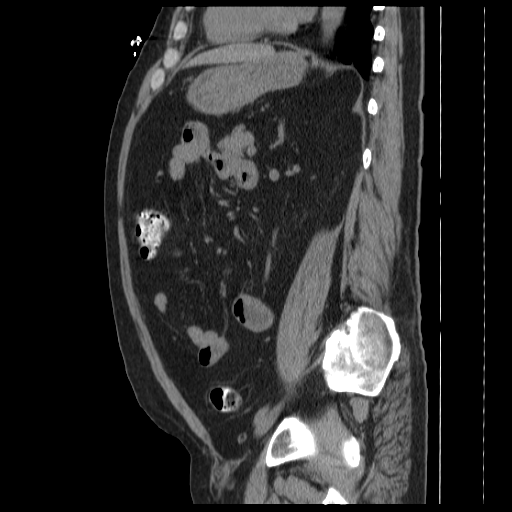
[im 104/127  soft-tissue]
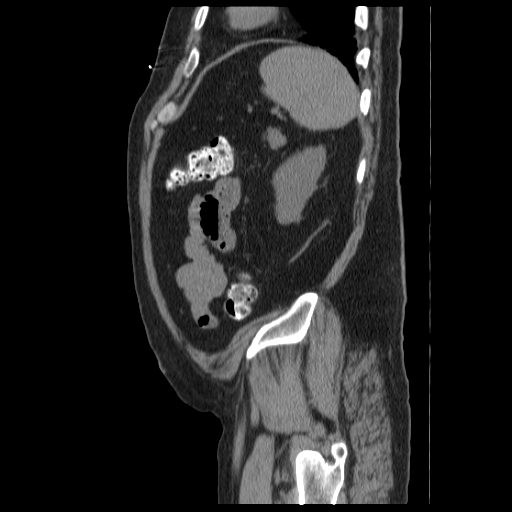
[im 115/127  soft-tissue]
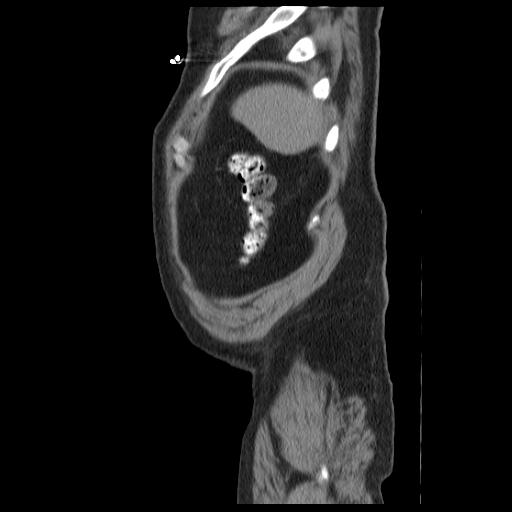

[14 of 32 positions shown; findings below may reference images not displayed]

FINDINGS: Compressive atelectasis is seen in the dependent lung
bases bilaterally.  No focal abnormalities seen in the liver or
spleen on this study performed without intravenous contrast
material.  The stomach is unremarkable.  Calcified lymph nodes in
the gastrohepatic ligament are unchanged, consistent with benign
etiology.  The duodenum, pancreas, and adrenal glands are
unremarkable.  The gallbladder is surgically absent.  13 mm low
density lesion in the interpolar right kidney is unchanged, likely
a cyst.  2 mm nonobstructing stone is identified in the interpolar
left kidney.

No abdominal aortic aneurysm.  There is no free fluid or
lymphadenopathy in the abdomen.  The abdominal bowel loops are
normal in appearance.

Imaging through the pelvis shows no free intraperitoneal fluid.  No
pelvic sidewall lymphadenopathy.  Bladder is unremarkable.  Small
bilateral inguinal hernias, right greater than left, contain only
fat.  A few scattered diverticuli are seen in the left colon
without diverticulitis. The terminal ileum and the appendix are
normal.

Bone windows reveal no worrisome lytic or sclerotic osseous
lesions.
IMPRESSION: No CT findings to explain the patient's history of right upper
quadrant pain and nausea.

Interval cholecystectomy.

2 mm nonobstructing left renal stone.

## 2014-10-31 DIAGNOSIS — R410 Disorientation, unspecified: Secondary | ICD-10-CM | POA: Insufficient documentation

## 2014-10-31 DIAGNOSIS — R110 Nausea: Secondary | ICD-10-CM | POA: Insufficient documentation

## 2016-09-20 DIAGNOSIS — M79609 Pain in unspecified limb: Secondary | ICD-10-CM | POA: Insufficient documentation

## 2016-11-25 ENCOUNTER — Encounter (HOSPITAL_COMMUNITY): Payer: Self-pay | Admitting: Emergency Medicine

## 2016-11-25 ENCOUNTER — Inpatient Hospital Stay (HOSPITAL_COMMUNITY)
Admission: AD | Admit: 2016-11-25 | Discharge: 2016-12-02 | DRG: 885 | Disposition: A | Discharge status: Home / Self Care | Payer: Medicare Other | Attending: Psychiatry | Admitting: Psychiatry

## 2016-11-25 DIAGNOSIS — G8929 Other chronic pain: Secondary | ICD-10-CM | POA: Diagnosis present

## 2016-11-25 DIAGNOSIS — G4700 Insomnia, unspecified: Secondary | ICD-10-CM | POA: Diagnosis present

## 2016-11-25 DIAGNOSIS — Z9889 Other specified postprocedural states: Secondary | ICD-10-CM | POA: Diagnosis not present

## 2016-11-25 DIAGNOSIS — Z8619 Personal history of other infectious and parasitic diseases: Secondary | ICD-10-CM | POA: Diagnosis present

## 2016-11-25 DIAGNOSIS — R1310 Dysphagia, unspecified: Secondary | ICD-10-CM

## 2016-11-25 DIAGNOSIS — Z888 Allergy status to other drugs, medicaments and biological substances status: Secondary | ICD-10-CM | POA: Diagnosis not present

## 2016-11-25 DIAGNOSIS — Z91041 Radiographic dye allergy status: Secondary | ICD-10-CM | POA: Diagnosis not present

## 2016-11-25 DIAGNOSIS — E119 Type 2 diabetes mellitus without complications: Secondary | ICD-10-CM

## 2016-11-25 DIAGNOSIS — Z818 Family history of other mental and behavioral disorders: Secondary | ICD-10-CM

## 2016-11-25 DIAGNOSIS — F1120 Opioid dependence, uncomplicated: Secondary | ICD-10-CM | POA: Diagnosis present

## 2016-11-25 DIAGNOSIS — F332 Major depressive disorder, recurrent severe without psychotic features: Principal | ICD-10-CM | POA: Diagnosis present

## 2016-11-25 DIAGNOSIS — F1129 Opioid dependence with unspecified opioid-induced disorder: Secondary | ICD-10-CM

## 2016-11-25 DIAGNOSIS — Z9049 Acquired absence of other specified parts of digestive tract: Secondary | ICD-10-CM

## 2016-11-25 DIAGNOSIS — Z79899 Other long term (current) drug therapy: Secondary | ICD-10-CM

## 2016-11-25 DIAGNOSIS — F333 Major depressive disorder, recurrent, severe with psychotic symptoms: Secondary | ICD-10-CM | POA: Diagnosis present

## 2016-11-25 DIAGNOSIS — F17210 Nicotine dependence, cigarettes, uncomplicated: Secondary | ICD-10-CM | POA: Diagnosis present

## 2016-11-25 DIAGNOSIS — Z87442 Personal history of urinary calculi: Secondary | ICD-10-CM

## 2016-11-25 DIAGNOSIS — I10 Essential (primary) hypertension: Secondary | ICD-10-CM | POA: Diagnosis present

## 2016-11-25 DIAGNOSIS — Z91040 Latex allergy status: Secondary | ICD-10-CM | POA: Diagnosis not present

## 2016-11-25 DIAGNOSIS — Z8249 Family history of ischemic heart disease and other diseases of the circulatory system: Secondary | ICD-10-CM

## 2016-11-25 HISTORY — DX: Other chronic pain: G89.29

## 2016-11-25 LAB — GLUCOSE, CAPILLARY: Glucose-Capillary: 163 mg/dL — ABNORMAL HIGH (ref 65–99)

## 2016-11-25 MED ORDER — MAGNESIUM HYDROXIDE 400 MG/5ML PO SUSP: 30.0000 mL | Freq: Every day | ORAL | Status: DC | PRN

## 2016-11-25 MED ORDER — DICYCLOMINE HCL 20 MG PO TABS: 20.0000 mg | ORAL_TABLET | Freq: Four times a day (QID) | ORAL | Status: DC | PRN

## 2016-11-25 MED ORDER — ALUM & MAG HYDROXIDE-SIMETH 200-200-20 MG/5ML PO SUSP: 30.0000 mL | ORAL | Status: DC | PRN

## 2016-11-25 MED ORDER — ACETAMINOPHEN 325 MG PO TABS
650.0000 mg | ORAL_TABLET | Freq: Four times a day (QID) | ORAL | Status: DC | PRN
  Administered 2016-11-26: 650 mg via ORAL
  Filled 2016-11-25: qty 2

## 2016-11-25 MED ORDER — METHOCARBAMOL 500 MG PO TABS
500.0000 mg | ORAL_TABLET | Freq: Three times a day (TID) | ORAL | Status: DC | PRN
  Administered 2016-11-26: 500 mg via ORAL
  Filled 2016-11-25: qty 1

## 2016-11-25 MED ORDER — TRAZODONE HCL 50 MG PO TABS: 50.0000 mg | ORAL_TABLET | Freq: Every evening | ORAL | Status: DC | PRN

## 2016-11-25 MED ORDER — CLONIDINE HCL 0.1 MG PO TABS: 0.1000 mg | ORAL_TABLET | ORAL | Status: DC

## 2016-11-25 MED ORDER — HYDROXYZINE HCL 25 MG PO TABS: 25.0000 mg | ORAL_TABLET | Freq: Four times a day (QID) | ORAL | Status: DC | PRN

## 2016-11-25 MED ORDER — ONDANSETRON 4 MG PO TBDP: 4.0000 mg | ORAL_TABLET | Freq: Four times a day (QID) | ORAL | Status: DC | PRN

## 2016-11-25 MED ORDER — NICOTINE POLACRILEX 2 MG MT GUM: 2.0000 mg | CHEWING_GUM | OROMUCOSAL | Status: DC | PRN

## 2016-11-25 MED ORDER — CLONIDINE HCL 0.1 MG PO TABS: 0.1000 mg | ORAL_TABLET | Freq: Every day | ORAL | Status: DC

## 2016-11-25 MED ORDER — NAPROXEN 500 MG PO TABS: 500.0000 mg | ORAL_TABLET | Freq: Two times a day (BID) | ORAL | Status: DC | PRN

## 2016-11-25 MED ORDER — CLONIDINE HCL 0.1 MG PO TABS
0.1000 mg | ORAL_TABLET | Freq: Four times a day (QID) | ORAL | Status: DC
  Administered 2016-11-25: 0.1 mg via ORAL
  Filled 2016-11-25 (×8): qty 1

## 2016-11-25 MED ORDER — LOPERAMIDE HCL 2 MG PO CAPS: 2.0000 mg | ORAL_CAPSULE | ORAL | Status: DC | PRN

## 2016-11-25 NOTE — BH Assessment (Addendum)
Assessment Note  Raymond Melton is a 61 [facility]. Below is the assessment completed on today @ 0124 by Venda RodesFord Warrick, [facility]:  Pt was given medication in the ED and is drowsy. He gave single word responses to most questions. Pt reports that his wife left him today because of his drug problem. Pt reports he has been abusing prescribed OxyContin, ingesting 5-6 tabs daily for "a while." He says he was prescribed OxyContin for chronic neck pain and recently had neck surgery. He denies alcohol or other substance use. He reports feeling very depressed with symptoms including crying spells, social withdrawal, loss of interest in usual pleasures, decreased motivation, decreased concentration, irritability and feelings of hopelessness. He reports poor sleep and poor appetite. He reports current suicidal ideation and told EDP he had hoarded OxyContin with plan to overdose. He reports having thoughts of harming his wife and does have a plan to harm her but would not explain further. He reports he does have access to a gun. He reports having visual hallucinations, which he describes as "shadows", and says he has experienced seeing shadows today. Pt says he is unemployed. He denies legal problems.      Diagnosis: MDD, Recurrent, severe, w/ psychotic features; Opioid use disorder, severe  Past Medical History:  Past Medical History:  Diagnosis Date  . Alcohol abuse   . Chronic pain   . Diabetes mellitus without complication (HCC)   . Hepatitis C   . Hypertension   . Immune deficiency disorder (HCC)   . Kidney stones   . TB lung, latent     Past Surgical History:  Procedure Laterality Date  . ABDOMINAL SURGERY    . ANKLE SURGERY    . CHOLECYSTECTOMY    . ELBOW SURGERY    . EYE SURGERY    . HERNIA REPAIR    . WRIST SURGERY      Family History: No family history on file.  Social History:  reports that he has quit smoking. His smoking use included Cigarettes. He does not  have any smokeless tobacco history on file. He reports that he drinks alcohol. He reports that he does not use drugs.  Additional Social History:  Alcohol / Drug Use Pain Medications: see PTA meds Prescriptions: see PTA Meds Over the Counter: see PTA meds History of alcohol / drug use?: Yes  CIWA:   COWS:    Allergies:  Allergies  Allergen Reactions  . Contrast Media [Iodinated Diagnostic Agents] Anaphylaxis and Swelling  . Codeine Itching  . Latex   . Lisinopril   . Tuberculin Tests Other (See Comments)    Severe skin reaction  . Valsartan     Home Medications:  (Not in a hospital admission)  OB/GYN Status:  No LMP for male patient.  General Assessment Data Location of Assessment: [facility] TTS Assessment: Out of system Is this a Tele or Face-to-Face Assessment?: Tele Assessment Is this an Initial Assessment or a Re-assessment for this encounter?: Initial Assessment Marital status: Married Living Arrangements: Alone Can pt return to current living arrangement?: Yes Admission Status: Involuntary Is patient capable of signing voluntary admission?: Yes Referral Source: Self/Family/Friend     Crisis Care Plan Living Arrangements: Alone  Education Status Is patient currently in school?: No  Risk to self with the past 6 months Suicidal Ideation: Yes-Currently Present Has patient been a risk to self within the past 6 months prior to admission? : No Suicidal Intent: Yes-Currently Present Has patient  had any suicidal intent within the past 6 months prior to admission? : No Is patient at risk for suicide?: Yes Suicidal Plan?: Yes-Currently Present Has patient had any suicidal plan within the past 6 months prior to admission? : Yes Specify Current Suicidal Plan: overdose on oxycontin Access to Means: Yes What has been your use of drugs/alcohol within the last 12 months?: see narrative Recent stressful life event(s): Other (Comment) (wife left  him) Persecutory voices/beliefs?: No Depression: Yes Depression Symptoms: Tearfulness, Isolating, Loss of interest in usual pleasures, Feeling worthless/self pity, Feeling angry/irritable Suicide prevention information given to non-admitted patients: Not applicable  Risk to Others within the past 6 months Homicidal Ideation: No Does patient have any lifetime risk of violence toward others beyond the six months prior to admission? : Unknown Thoughts of Harm to Others: Yes-Currently Present Comment - Thoughts of Harm to Others: see narrative Current Homicidal Intent: Yes-Currently Present Current Homicidal Plan: No Access to Homicidal Means: Yes Describe Access to Homicidal Means: pt has access to a gun Identified Victim: pt's wife Assessment of Violence: None Noted Does patient have access to weapons?: Yes (Comment) Criminal Charges Pending?: No Does patient have a court date: No Is patient on probation?: No  Psychosis Hallucinations: Visual Delusions: None noted  Mental Status Report Appearance/Hygiene: Unremarkable Mood: Sad Affect: Depressed Judgement: Impaired Obsessive Compulsive Thoughts/Behaviors: Unable to Assess  Cognitive Functioning Concentration: Unable to Assess Memory: Recent Intact, Remote Intact IQ: Average Insight: Good Appetite: Poor Sleep: Decreased Vegetative Symptoms: Unable to Assess  ADLScreening [facility]) Patient's cognitive ability adequate to safely complete daily activities?: Yes Patient able to express need for assistance with ADLs?: Yes Independently performs ADLs?: Yes (appropriate for developmental age)        ADL Screening (condition at time of admission) Patient's cognitive ability adequate to safely complete daily activities?: Yes Is the patient deaf or have difficulty hearing?: No Does the patient have difficulty seeing, even when wearing glasses/contacts?: No Does the patient have difficulty concentrating,  remembering, or making decisions?: No Patient able to express need for assistance with ADLs?: Yes Does the patient have difficulty dressing or bathing?: No Independently performs ADLs?: Yes (appropriate for developmental age) Does the patient have difficulty walking or climbing stairs?: No Weakness of Legs: None Weakness of Arms/Hands: None  Home Assistive Devices/Equipment Home Assistive Devices/Equipment: None    Abuse/Neglect Assessment (Assessment to be complete while patient is alone) Physical Abuse: Denies Verbal Abuse: Denies Sexual Abuse: Denies Exploitation of patient/patient's resources: Denies Self-Neglect: Denies     Merchant [employer] officer (For Healthcare) Does Patient Have a Medical Advance Directive?: No Would patient like information on creating a medical advance directive?: No - Patient declined          Disposition:  Disposition Initial Assessment Completed for this Encounter: Yes (consulted with Nira Conn, NP) Disposition of Patient: Inpatient treatment program Type of inpatient treatment program: Adult  On Site Evaluation by:   Reviewed with Physician:    Laddie Aquas 11/25/2016 3:26 PM

## 2016-11-25 NOTE — Progress Notes (Signed)
Raymond Melton is a 61 year old male being admitted involuntarily to [facility] from [facility].  He was brought into the ED by his wife for acute suicidal ideation, mania, loud rapid speech.  While in the ED, he had to have Geodon and Ativan IM to help him calm down.  He was positive for benzo's and oxycodone.  His BAL was 0.01.  He has history of HTN and recent neck surgery.  He is diagnosed with Major Depressive Disorder.   [facility], he denied SI/HI or A/V hallucinations.  He reported that he has been seeing shadows out of the corner of his eyes and this last happened last week.   He does voice that he is feeling depressed, anxious, restless, and having difficulty sleeping.  He denies abusing alcohol.  He does admit that he has been taking more of his oxycodone that he is prescribed by his doctor.  "I really need to stop using them and get back on track."  He reports that he has hypertension, diabetes and stomach problems.  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  He reported that he continues to have occasional pain issues with his recent neck surgery but is doing ok at the moment. He appears to be in no physical distress.  He was pleasant and cooperative.  He denies SI/HI or A/V hallucinations at this time.  Oriented him to the unit Admission paperwork completed and signed.  Belongings searched and secured in locker # 19.  Skin assessment completed and noted healing scar on his right lower neck from recent surgery.  Q 15 minute checks initiated for safety.  We will monitor the progress towards his goals.

## 2016-11-25 NOTE — Tx Team (Signed)
Initial Treatment Plan 11/25/2016 11:12 PM Raymond Melton [id number]    PATIENT STRESSORS: Health problems Marital or family conflict Substance abuse   PATIENT STRENGTHS: Licensed conveyancerCommunication skills Financial means Motivation for treatment/growth   PATIENT IDENTIFIED PROBLEMS: Depression  Substance abuse  Suicidal ideation  Anxiety  Psychosis  "Stay off the drugs"  "Work on a better relationship with my wife"         DISCHARGE CRITERIA:  Improved stabilization in mood, thinking, and/or behavior Verbal commitment to aftercare and medication compliance Withdrawal symptoms are absent or subacute and managed without 24-hour nursing intervention  PRELIMINARY DISCHARGE PLAN: Outpatient therapy Medication managemet  PATIENT/FAMILY INVOLVEMENT: This treatment plan has been presented to and reviewed with the patient, Raymond Melton.  The patient and family have been given the opportunity to ask questions and make suggestions.  Levin BaconHeather V [employer], RN 11/25/2016, 11:12 PM

## 2016-11-26 ENCOUNTER — Encounter (HOSPITAL_COMMUNITY): Payer: Self-pay | Admitting: Psychiatry

## 2016-11-26 DIAGNOSIS — I10 Essential (primary) hypertension: Secondary | ICD-10-CM | POA: Diagnosis present

## 2016-11-26 DIAGNOSIS — Z818 Family history of other mental and behavioral disorders: Secondary | ICD-10-CM

## 2016-11-26 DIAGNOSIS — Z91040 Latex allergy status: Secondary | ICD-10-CM

## 2016-11-26 DIAGNOSIS — Z888 Allergy status to other drugs, medicaments and biological substances status: Secondary | ICD-10-CM

## 2016-11-26 DIAGNOSIS — E119 Type 2 diabetes mellitus without complications: Secondary | ICD-10-CM

## 2016-11-26 DIAGNOSIS — Z9889 Other specified postprocedural states: Secondary | ICD-10-CM

## 2016-11-26 DIAGNOSIS — Z8249 Family history of ischemic heart disease and other diseases of the circulatory system: Secondary | ICD-10-CM

## 2016-11-26 DIAGNOSIS — Z79899 Other long term (current) drug therapy: Secondary | ICD-10-CM

## 2016-11-26 DIAGNOSIS — F332 Major depressive disorder, recurrent severe without psychotic features: Principal | ICD-10-CM

## 2016-11-26 DIAGNOSIS — F1120 Opioid dependence, uncomplicated: Secondary | ICD-10-CM | POA: Clinically undetermined

## 2016-11-26 DIAGNOSIS — Z8619 Personal history of other infectious and parasitic diseases: Secondary | ICD-10-CM | POA: Diagnosis present

## 2016-11-26 LAB — GLUCOSE, CAPILLARY
GLUCOSE-CAPILLARY: 140 mg/dL — AB (ref 65–99)
GLUCOSE-CAPILLARY: 118 mg/dL — AB (ref 65–99)
Glucose-Capillary: 123 mg/dL — ABNORMAL HIGH (ref 65–99)
Glucose-Capillary: 110 mg/dL — ABNORMAL HIGH (ref 65–99)

## 2016-11-26 MED ORDER — CLONIDINE HCL 0.1 MG PO TABS
0.1000 mg | ORAL_TABLET | Freq: Every day | ORAL | Status: AC
  Administered 2016-11-30 – 2016-12-01 (×2): 0.1 mg via ORAL
  Filled 2016-11-26 (×2): qty 1

## 2016-11-26 MED ORDER — CHLORDIAZEPOXIDE HCL 25 MG PO CAPS
25.0000 mg | ORAL_CAPSULE | Freq: Four times a day (QID) | ORAL | Status: DC | PRN
  Administered 2016-11-26 – 2016-12-01 (×5): 25 mg via ORAL
  Filled 2016-11-26 (×5): qty 1

## 2016-11-26 MED ORDER — METHOCARBAMOL 500 MG PO TABS
500.0000 mg | ORAL_TABLET | Freq: Three times a day (TID) | ORAL | Status: AC | PRN
  Administered 2016-11-28 – 2016-11-30 (×6): 500 mg via ORAL
  Filled 2016-11-26 (×7): qty 1

## 2016-11-26 MED ORDER — TRAZODONE HCL 50 MG PO TABS
50.0000 mg | ORAL_TABLET | Freq: Every evening | ORAL | Status: DC | PRN
  Administered 2016-11-26 – 2016-11-27 (×2): 50 mg via ORAL
  Filled 2016-11-26 (×2): qty 1

## 2016-11-26 MED ORDER — INSULIN ASPART 100 UNIT/ML ~~LOC~~ SOLN
0.0000 [IU] | Freq: Three times a day (TID) | SUBCUTANEOUS | Status: DC
  Administered 2016-11-26: 2 [IU] via SUBCUTANEOUS
  Administered 2016-11-27: 3 [IU] via SUBCUTANEOUS
  Administered 2016-11-27 – 2016-11-28 (×3): 2 [IU] via SUBCUTANEOUS
  Administered 2016-11-28: 3 [IU] via SUBCUTANEOUS
  Administered 2016-11-29 – 2016-12-01 (×5): 2 [IU] via SUBCUTANEOUS

## 2016-11-26 MED ORDER — NICOTINE POLACRILEX 2 MG MT GUM: 2.0000 mg | CHEWING_GUM | OROMUCOSAL | Status: DC | PRN

## 2016-11-26 MED ORDER — ONDANSETRON 4 MG PO TBDP
4.0000 mg | ORAL_TABLET | Freq: Four times a day (QID) | ORAL | Status: DC | PRN
  Administered 2016-11-26 – 2016-11-27 (×2): 4 mg via ORAL
  Filled 2016-11-26 (×2): qty 1

## 2016-11-26 MED ORDER — ACETAMINOPHEN 325 MG PO TABS
650.0000 mg | ORAL_TABLET | Freq: Four times a day (QID) | ORAL | Status: DC | PRN
  Administered 2016-11-28: 650 mg via ORAL
  Filled 2016-11-26: qty 2

## 2016-11-26 MED ORDER — CHLORDIAZEPOXIDE HCL 25 MG PO CAPS: 25.0000 mg | ORAL_CAPSULE | Freq: Four times a day (QID) | ORAL | Status: DC

## 2016-11-26 MED ORDER — DICYCLOMINE HCL 20 MG PO TABS
20.0000 mg | ORAL_TABLET | Freq: Four times a day (QID) | ORAL | Status: DC | PRN
  Administered 2016-11-26: 20 mg via ORAL
  Filled 2016-11-26: qty 1

## 2016-11-26 MED ORDER — HYDROXYZINE HCL 25 MG PO TABS
25.0000 mg | ORAL_TABLET | Freq: Four times a day (QID) | ORAL | Status: DC | PRN
  Administered 2016-11-26 – 2016-11-30 (×6): 25 mg via ORAL
  Filled 2016-11-26 (×7): qty 1

## 2016-11-26 MED ORDER — LOPERAMIDE HCL 2 MG PO CAPS: 2.0000 mg | ORAL_CAPSULE | ORAL | Status: DC | PRN

## 2016-11-26 MED ORDER — INSULIN ASPART 100 UNIT/ML ~~LOC~~ SOLN: 0.0000 [IU] | Freq: Every day | SUBCUTANEOUS | Status: DC

## 2016-11-26 MED ORDER — DULOXETINE HCL 30 MG PO CPEP
30.0000 mg | ORAL_CAPSULE | Freq: Every day | ORAL | Status: DC
  Administered 2016-11-26 – 2016-11-27 (×2): 30 mg via ORAL
  Filled 2016-11-26 (×5): qty 1

## 2016-11-26 MED ORDER — CLONIDINE HCL 0.1 MG PO TABS
0.1000 mg | ORAL_TABLET | Freq: Four times a day (QID) | ORAL | Status: AC
  Administered 2016-11-26 – 2016-11-27 (×8): 0.1 mg via ORAL
  Filled 2016-11-26 (×8): qty 1

## 2016-11-26 MED ORDER — CLONIDINE HCL 0.1 MG PO TABS
0.1000 mg | ORAL_TABLET | ORAL | Status: AC
  Administered 2016-11-28 – 2016-11-29 (×4): 0.1 mg via ORAL
  Filled 2016-11-26 (×4): qty 1

## 2016-11-26 MED ORDER — NAPROXEN 500 MG PO TABS
500.0000 mg | ORAL_TABLET | Freq: Two times a day (BID) | ORAL | Status: AC | PRN
  Administered 2016-11-26 – 2016-11-30 (×6): 500 mg via ORAL
  Filled 2016-11-26 (×7): qty 1

## 2016-11-26 NOTE — H&P (Signed)
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 Opioid use disorder, moderate, dependence (HCC) [F11.20] 11/26/2016  . History of neck surgery [Z98.890] 11/26/2016  . Hx of hepatitis C [Z86.19] 11/26/2016  . Hypertension [I10] 11/26/2016  . Diabetes (HCC) [E11.9] 11/26/2016  . MDD (major depressive disorder), recurrent severe, without psychosis (HCC) [F33.2] 11/25/2016   History of Present Illness: Raymond Melton is a 61 y.o. male [facility].  [facility] after his wife told him that she would leave if he did not get help.  He admits to a drug problem that spans over 40 years.  However, after neck surgery, his dependence on Oxycodone worsened.    He denies current alcohol or other substance use. He reports feeling very depressed.  He states that his wife found him unreliable and that he was not functioning properly.  He started to neglect himself.  He denies any current SI HI and AVH.  Associated Signs/Symptoms: Depression Symptoms:  depressed mood, anxiety, (Hypo) Manic Symptoms:  Irritable Mood, Labiality of Mood, Anxiety Symptoms:  Excessive Worry, Social Anxiety, Psychotic Symptoms:  NA PTSD Symptoms: NA Total Time spent with patient: 30 minutes  Past Psychiatric History: see HPI  Is the patient at risk to self? Yes.    Has the patient been a risk to self in the past 6 months? Yes.    Has the patient been a risk to self within the distant past? Yes.    Is the patient a risk to others? No.  Has the patient been a risk to others in the past 6 months? No.  Has the patient been a risk to others within the distant  past? No.   Prior Inpatient Therapy:   Prior Outpatient Therapy:    Alcohol Screening: 1. How often do you have a drink containing alcohol?: 2 to 4 times a month 2. How many drinks containing alcohol do you have on a typical day when you are drinking?: 1 or 2 3. How often do you have six or more drinks on one occasion?: Never Preliminary Score: 0 9. Have you or someone else been injured as a result of your drinking?: No 10. Has a relative or friend or a doctor or another health worker been concerned about your drinking or suggested you cut down?: No Alcohol Use Disorder Identification Test Final Score (AUDIT): 2 Brief Intervention: AUDIT score less than 7 or less-screening does not suggest unhealthy drinking-brief intervention not indicated Substance Abuse History in the last 12 months:  Yes.   Consequences of Substance Abuse: inpatient admission for treatment Previous Psychotropic Medications: Yes  Psychological Evaluations: Yes  Past Medical History:  Past Medical History:  Diagnosis Date  . Alcohol abuse   . Chronic pain   . Diabetes mellitus without complication (HCC)   . Hepatitis C   . Hypertension   . Immune deficiency disorder (HCC)   . Kidney stones   . TB lung, latent     Past Surgical History:  Procedure Laterality Date  . ABDOMINAL SURGERY    . ANKLE SURGERY    . CHOLECYSTECTOMY    . ELBOW SURGERY    . EYE SURGERY    . HERNIA REPAIR    .  WRIST SURGERY     Family History:  Family History  Problem Relation Age of Onset  . Depression Mother   . Hypertension Mother    Family Psychiatric  History: see HPI Tobacco Screening: Have you used any form of tobacco in the last 30 days? (Cigarettes, Smokeless Tobacco, Cigars, and/or Pipes): Yes Tobacco use, Select all that apply: 5 or more cigarettes per day Are you interested in Tobacco Cessation Medications?: Yes, will notify MD for an order Counseled patient on smoking cessation including recognizing danger  situations, developing coping skills and basic information about quitting provided: Refused/Declined practical counseling Social History:  History  Alcohol Use  . Yes    Comment: daily     History  Drug Use No    Additional Social History: Marital status: Married    Pain Medications: Oxycodone  Prescriptions: see PTA Meds Over the Counter: see PTA meds History of alcohol / drug use?: Yes Name of Substance 1: Oxycodone 1 - Age of First Use: years 1 - Amount (size/oz): 10mg  1 - Frequency: 6-8 pills per day 1 - Duration: years 1 - Last Use / Amount: yesterday                  Allergies:   Allergies  Allergen Reactions  . Contrast Media [Iodinated Diagnostic Agents] Anaphylaxis and Swelling  . Ioxaglate Anaphylaxis and Swelling  . Metrizamide Anaphylaxis and Swelling  . Other Anaphylaxis, Shortness Of Breath and Swelling    Iv dye  . Codeine Itching    Other reaction(s): Other (See Comments) restless  . Iohexol Other (See Comments)  . Latex   . Lisinopril   . Tuberculin     Other reaction(s): Other (See Comments) Severe skin reaction  . Tuberculin Tests Other (See Comments)    Severe skin reaction  . Valsartan   . Zolpidem Other (See Comments)    confusion   Lab Results:  Results for orders placed or performed during the hospital encounter of 11/25/16 (from the past 48 hour(s))  Glucose, capillary     Status: Abnormal   Collection Time: 11/25/16  9:25 PM  Result Value Ref Range   Glucose-Capillary 163 (H) 65 - 99 mg/dL   Comment 1 Notify RN   Glucose, capillary     Status: Abnormal   Collection Time: 11/26/16  6:23 AM  Result Value Ref Range   Glucose-Capillary 140 (H) 65 - 99 mg/dL  Glucose, capillary     Status: Abnormal   Collection Time: 11/26/16 11:52 AM  Result Value Ref Range   Glucose-Capillary 118 (H) 65 - 99 mg/dL    Blood Alcohol level:  Lab Results  Component Value Date   ETH <11 02/15/2013   ETH <11 01/13/2013    Metabolic  Disorder Labs:  No results found for: HGBA1C, MPG No results found for: PROLACTIN No results found for: CHOL, TRIG, HDL, CHOLHDL, VLDL, LDLCALC  Current Medications: Current Facility-Administered Medications  Medication Dose Route Frequency Provider Last Rate Last Dose  . acetaminophen (TYLENOL) tablet 650 mg  650 mg Oral Q6H PRN Craige Cotta, MD      . alum & mag hydroxide-simeth (MAALOX/MYLANTA) 200-200-20 MG/5ML suspension 30 mL  30 mL Oral Q4H PRN Laveda Abbe, NP      . chlordiazePOXIDE (LIBRIUM) capsule 25 mg  25 mg Oral Q6H PRN Jomarie Longs, MD      . cloNIDine (CATAPRES) tablet 0.1 mg  0.1 mg Oral QID Craige Cotta, MD   0.1  mg at 11/26/16 1318   Followed by  . [START ON 11/28/2016] cloNIDine (CATAPRES) tablet 0.1 mg  0.1 mg Oral BH-qamhs Craige Cotta, MD       Followed by  . [START ON 11/30/2016] cloNIDine (CATAPRES) tablet 0.1 mg  0.1 mg Oral QAC breakfast Rockey Situ Cobos, MD      . dicyclomine (BENTYL) tablet 20 mg  20 mg Oral Q6H PRN Craige Cotta, MD   20 mg at 11/26/16 0811  . DULoxetine (CYMBALTA) DR capsule 30 mg  30 mg Oral Daily Saramma Eappen, MD   30 mg at 11/26/16 1507  . hydrOXYzine (ATARAX/VISTARIL) tablet 25 mg  25 mg Oral Q6H PRN Craige Cotta, MD   25 mg at 11/26/16 0811  . insulin aspart (novoLOG) injection 0-15 Units  0-15 Units Subcutaneous TID WC Saramma Eappen, MD      . insulin aspart (novoLOG) injection 0-5 Units  0-5 Units Subcutaneous QHS Saramma Eappen, MD      . loperamide (IMODIUM) capsule 2-4 mg  2-4 mg Oral PRN Rockey Situ Cobos, MD      . magnesium hydroxide (MILK OF MAGNESIA) suspension 30 mL  30 mL Oral Daily PRN Laveda Abbe, NP      . methocarbamol (ROBAXIN) tablet 500 mg  500 mg Oral Q8H PRN Rockey Situ Cobos, MD      . naproxen (NAPROSYN) tablet 500 mg  500 mg Oral BID PRN Craige Cotta, MD   500 mg at 11/26/16 1610  . nicotine polacrilex (NICORETTE) gum 2 mg  2 mg Oral PRN Craige Cotta, MD      .  ondansetron (ZOFRAN-ODT) disintegrating tablet 4 mg  4 mg Oral Q6H PRN Craige Cotta, MD   4 mg at 11/26/16 0811  . traZODone (DESYREL) tablet 50 mg  50 mg Oral QHS PRN Craige Cotta, MD       PTA Medications: Prescriptions Prior to Admission  Medication Sig Dispense Refill Last Dose  . albuterol (PROAIR HFA) 108 (90 Base) MCG/ACT inhaler INHALE 1-2 PUFFS EVERY 6 HOURS AS NEEDED     . ALPRAZolam (XANAX) 1 MG tablet Take 1 mg by mouth at bedtime.   Past Week at Unknown time  . amLODipine (NORVASC) 5 MG tablet Take 5 mg by mouth daily.   Past Month at Unknown time  . cetirizine (ZYRTEC) 10 MG tablet Take 10 mg by mouth.     . cholestyramine light (PREVALITE) 4 g packet Take by mouth.     . diphenhydrAMINE (BENADRYL) 25 mg capsule Take 100 mg by mouth at bedtime as needed.   Past Month at Unknown time  . dronabinol (MARINOL) 10 MG capsule Take 10 mg by mouth.     . fluorometholone (FML) 0.1 % ophthalmic suspension One drop right eye twice daily for one week, then once daily     . ibuprofen (ADVIL,MOTRIN) 800 MG tablet Take 800 mg by mouth every 8 (eight) hours as needed.   Past Week at Unknown time  . metoprolol succinate (TOPROL-XL) 100 MG 24 hr tablet Take 100 mg by mouth daily. Take with or immediately following a meal.   Past Month at Unknown time  . mirtazapine (REMERON) 15 MG tablet TAKE ONE TABLET BY MOUTH NIGHTLY     . oxyCODONE (OXY IR/ROXICODONE) 5 MG immediate release tablet Take 10-20 mg by mouth every 4 (four) hours as needed for severe pain.   Past Week at Unknown time  . oxyCODONE (  OXYCONTIN) 20 mg 12 hr tablet TAKE ONE TABLET BY MOUTH EVERY TWELVE HOURS FOR 30 DAYS     . Oxycodone HCl 10 MG TABS Take 10 mg by mouth.     . promethazine (PHENERGAN) 25 MG tablet TAKE 1 TABLET EVERY 6 HOURS AS NEEDED FOR NAUSEA.     [name] sitaGLIPtin-metformin (JANUMET) 50-1000 MG tablet Take 1 tablet by mouth 2 (two) times daily with a meal.   Past Month at Unknown time  . traZODone (DESYREL) 150 MG  tablet Take 150 mg by mouth.     [name] amLODipine (NORVASC) 5 MG tablet Take by mouth.     [name] amoxicillin-clavulanate (AUGMENTIN) 875-125 MG tablet TAKE ONE TABLET BY MOUTH EVERY TWELVE HOURS FOR 10 DAYS  0   . azithromycin (ZITHROMAX) 250 MG tablet TAKE TWO TABLETS BY MOUTH ON DAY 1 THEN TAKE ONE TABLET DAILY ON DAYS 2 - 5  0   . BELSOMRA 20 MG TABS TK 1 T PO QHS  0   . buprenorphine (BUTRANS - DOSED MCG/HR) 20 MCG/HR PTWK patch Place onto the skin.     . cyclobenzaprine (FLEXERIL) 10 MG tablet TK 1 T PO Q 6 TO 8 H PRF SPASM  0   . cyclobenzaprine (FLEXERIL) 5 MG tablet TAKE 1 TO 2 TABLETS BY MOUTH THREE TIMES DAILY  0   . dicyclomine (BENTYL) 20 MG tablet Take 20 mg by mouth every 6 (six) hours.  0   . folic acid (FOLVITE) 1 MG tablet Take by mouth.     . hydrOXYzine (VISTARIL) 25 MG capsule TK 1 C PO Q 8 H PRF ITCHING  0   . ibuprofen (ADVIL,MOTRIN) 600 MG tablet Take 600 mg by mouth.     [name] lisinopril-hydrochlorothiazide (PRINZIDE,ZESTORETIC) 20-12.5 MG tablet TK 1 T PO QD  0   . LYRICA 100 MG capsule Take 100 mg by mouth 2 (two) times daily.  1   . metoprolol succinate (TOPROL-XL) 100 MG 24 hr tablet Take by mouth.     . ondansetron (ZOFRAN-ODT) 4 MG disintegrating tablet Take 4 mg by mouth every 6 (six) hours as needed.  0   . oxyCODONE (OXYCONTIN) 10 mg 12 hr tablet Take by mouth.     . oxyCODONE-acetaminophen (PERCOCET/ROXICET) 5-325 MG tablet TAKE ONE TABLET BY MOUTH EVERY EIGHT TO TWELVE HOURS AS NEEDED  0   . PANCRELIPASE, LIP-PROT-AMYL, PO Take by mouth.     [name] PREVNAR 13 SUSP injection ADM 0.5ML IM UTD  0   . promethazine (PHENERGAN) 25 MG tablet Take 25 mg by mouth every 12 (twelve) hours.  0   . QUEtiapine Fumarate (SEROQUEL XR) 150 MG 24 hr tablet Take by mouth.     . temazepam (RESTORIL) 15 MG capsule TK 1 C PO QD HS PRN  0   . thiamine 100 MG tablet Take by mouth.     [name] tiZANidine (ZANAFLEX) 4 MG tablet Take 4 mg by mouth 3 (three) times daily as needed.  0   . triamcinolone cream  (KENALOG) 0.5 % APP EXT AA BID  0   . Vitamins/Minerals TABS Take by mouth.     . ziprasidone (GEODON) 20 MG capsule Take by mouth.       Musculoskeletal: Strength & Muscle Tone: within normal limits Gait & Station: normal Patient leans: N/A  Psychiatric Specialty Exam: Physical Exam  ROS  Blood pressure 133/66, pulse 60, temperature 97.7 F (36.5 C), temperature source Oral, resp. rate 20, height 6'  1" (1.854 m), weight 81.6 kg (180 lb), SpO2 100 %.Body mass index is 23.75 kg/m.  General Appearance: Neat  Eye Contact:  Good  Speech:  Clear and Coherent  Volume:  Normal  Mood:  Anxious  Affect:  Appropriate  Thought Process:  Coherent  Orientation:  Full (Time, Place, and Person)  Thought Content:  Rumination  Suicidal Thoughts:  No  Homicidal Thoughts:  No  Memory:  Immediate;   Fair Recent;   Fair Remote;   Fair  Judgement:  Fair  Insight:  Fair  Psychomotor Activity:  Normal  Concentration:  Concentration: Fair and Attention Span: Fair  Recall:  [employer] of Knowledge:  Fair  Language:  Fair  Akathisia:  No  Handed:  Right  AIMS (if indicated):     Assets:  Desire for Improvement Resilience Social Support  ADL's:  Intact  Cognition:  WNL  Sleep:  poor   Treatment Plan Summary: Admit for crisis management and mood stabilization. Medication management to re-stabilize current mood symptoms Group counseling sessions for coping skills Medical consults as needed Review and reinstate any pertinent home medications for other health problems   Observation Level/Precautions:  15 minute checks  Laboratory:  per ED  Psychotherapy:  group  Medications:  As per medlist  Consultations:  As needed  Discharge Concerns:  safety    Estimated LOS:  2-7 days  Other:     Physician Treatment Plan for Primary Diagnosis: MDD (major depressive disorder), recurrent severe, without psychosis (HCC) Long Term Goal(s): Improvement in symptoms so as ready for discharge  Short  Term Goals: Ability to identify changes in lifestyle to reduce recurrence of condition will improve, Ability to verbalize feelings will improve, Ability to disclose and discuss suicidal ideas, Ability to demonstrate self-control will improve, Ability to identify and develop effective coping behaviors will improve, Ability to maintain clinical measurements within normal limits will improve, Compliance with prescribed medications will improve and Ability to identify triggers associated with substance abuse/mental health issues will improve  Physician Treatment Plan for Secondary Diagnosis: Principal Problem:   MDD (major depressive disorder), recurrent severe, without psychosis (HCC) Active Problems:   Opioid use disorder, moderate, dependence (HCC)   History of neck surgery   Hx of hepatitis C   Hypertension   Diabetes (HCC)  Long Term Goal(s): Improvement in symptoms so as ready for discharge  Short Term Goals: Ability to identify changes in lifestyle to reduce recurrence of condition will improve, Ability to verbalize feelings will improve, Ability to disclose and discuss suicidal ideas, Ability to demonstrate self-control will improve, Ability to identify and develop effective coping behaviors will improve, Ability to maintain clinical measurements within normal limits will improve, Compliance with prescribed medications will improve and Ability to identify triggers associated with substance abuse/mental health issues will improve  I certify that inpatient services furnished can reasonably be expected to improve the patient's condition.    Lindwood Qua, NP [facility] 1/2/20183:25 PM

## 2016-11-26 NOTE — Progress Notes (Signed)
Recreation Therapy Notes  Animal-Assisted Activity (AAA) Program Checklist/Progress Notes Patient Eligibility Criteria Checklist & Daily Group note for Rec TxIntervention  Date: 01.02.2018 Time: 2:45pm [address]    AAA/T Program Assumption of Risk Form signed by Patient/ or Parent Legal Guardian Yes  Patient is free of allergies or sever asthma Yes  Patient reports no fear of animals Yes  Patient reports no history of cruelty to animals Yes  Patient understands his/her participation is voluntary Yes  Patient washes hands before animal contact Yes  Patient washes hands after animal contact Yes  Behavioral Response: Appropriate   Education:Hand Washing, Appropriate Animal Interaction   Education Outcome: Acknowledges education.   Clinical Observations/Feedback: Patient attended session and interacted appropriately with therapy dog and peers.    Marykay Lexenise L Andrej Spagnoli, LRT/CTRS        Raymond Melton L 11/26/2016 3:07 PM

## 2016-11-26 NOTE — Progress Notes (Signed)
Referral faxed to [facility] per patient request.  Trula SladeHeather Smart, MSW, LCSW Clinical Social Worker 11/26/2016 3:58 PM

## 2016-11-26 NOTE — Progress Notes (Signed)
Nursing Progress Note: 7p-7a D: Pt currently presents with a anxious/flat/pleasant affect and behavior. Pt states "I don't really know much about why I am here. I take something for my diabetes once a month but I don't know what it's called. I got surgery in my neck a couple weeks ago because my neck wouldn't close, but now it gets really stiff." Interacting appropriately with milieu. Pt reports ok sleep at home.   A: Pt provided with medications per providers orders. Pt's labs and vitals were monitored throughout the night. Pt supported emotionally and encouraged to express concerns and questions. Pt educated on medications.  R: Pt's safety ensured with 15 minute and environmental checks. Pt currently denies SI/HI/Self Harm and A/V hallucinations. Pt verbally contracts to seek staff if SI/HI or A/VH occurs and to consult with staff before acting on any harmful thoughts. Will continue to monitor.

## 2016-11-26 NOTE — Progress Notes (Signed)
D: Patient is new admission to unit.  He complains of some withdrawal symptoms.  Patient was given prn medication for nausea, muscle aches, pain and stomach cramping.  Patient recently had neck surgery and states he has chronic pain in his right arm.  Patient rates his depression and anxiety as an 8; his hopelessness as a 7.  Patient is pleasant with flat, blunted affect.  His mood is depressed.  Patient has a glass left eye.  He appears vested in treatment and has been attending groups.  Advised NP that he needs to be put back on his BP medication. A: Continue to monitor medication management and MD orders.  Safety checks completed every 15 minutes per protocol.  Offer support and encouragement as needed. R: Patient is receptive to staff; his behavior is appropriate.

## 2016-11-26 NOTE — [facility] Group Notes (Signed)

## 2016-11-26 NOTE — [facility] Group Notes (Signed)
[facility] LCSW Group Therapy  11/26/2016 4:12 PM  Type of Therapy:  Group Therapy  Participation Level:  Did Not Attend-pt chose to rest in bed. Invited.   Participation Quality: Summary of Progress/Problems: Today's Topic: Overcoming Obstacles. Patients identified one short term goal and potential obstacles in reaching this goal. Patients processed barriers involved in overcoming these obstacles. Patients identified steps necessary for overcoming these obstacles and explored motivation (internal and external) for facing these difficulties head on.   Aroura Vasudevan N Smart LCSW 11/26/2016, 4:12 PM

## 2016-11-26 NOTE — Tx Team (Signed)
Interdisciplinary Treatment and Diagnostic Plan Update  11/26/2016 Time of Session: 9:30AM Ziv Welchel MRN: [id number]  Principal Diagnosis: MDD (major depressive disorder), recurrent severe, without psychosis (HCC)  Secondary Diagnoses: Principal Problem:   MDD (major depressive disorder), recurrent severe, without psychosis (HCC) Active Problems:   Opiate addiction (HCC)   Current Medications:  Current Facility-Administered Medications  Medication Dose Route Frequency Provider Last Rate Last Dose  . acetaminophen (TYLENOL) tablet 650 mg  650 mg Oral Q6H PRN Craige Cotta, MD      . alum & mag hydroxide-simeth (MAALOX/MYLANTA) 200-200-20 MG/5ML suspension 30 mL  30 mL Oral Q4H PRN Laveda Abbe, NP      . cloNIDine (CATAPRES) tablet 0.1 mg  0.1 mg Oral QID Craige Cotta, MD   0.1 mg at 11/26/16 4098   Followed by  . [START ON 11/28/2016] cloNIDine (CATAPRES) tablet 0.1 mg  0.1 mg Oral BH-qamhs Craige Cotta, MD       Followed by  . [START ON 11/30/2016] cloNIDine (CATAPRES) tablet 0.1 mg  0.1 mg Oral QAC breakfast Rockey Situ Cobos, MD      . dicyclomine (BENTYL) tablet 20 mg  20 mg Oral Q6H PRN Craige Cotta, MD   20 mg at 11/26/16 0811  . hydrOXYzine (ATARAX/VISTARIL) tablet 25 mg  25 mg Oral Q6H PRN Craige Cotta, MD   25 mg at 11/26/16 0811  . loperamide (IMODIUM) capsule 2-4 mg  2-4 mg Oral PRN Craige Cotta, MD      . magnesium hydroxide (MILK OF MAGNESIA) suspension 30 mL  30 mL Oral Daily PRN Laveda Abbe, NP      . methocarbamol (ROBAXIN) tablet 500 mg  500 mg Oral Q8H PRN Craige Cotta, MD      . naproxen (NAPROSYN) tablet 500 mg  500 mg Oral BID PRN Craige Cotta, MD   500 mg at 11/26/16 1191  . nicotine polacrilex (NICORETTE) gum 2 mg  2 mg Oral PRN Craige Cotta, MD      . ondansetron (ZOFRAN-ODT) disintegrating tablet 4 mg  4 mg Oral Q6H PRN Craige Cotta, MD   4 mg at 11/26/16 0811  . traZODone (DESYREL) tablet 50 mg  50 mg  Oral QHS PRN Craige Cotta, MD       PTA Medications: Prescriptions Prior to Admission  Medication Sig Dispense Refill Last Dose  . ALPRAZolam (XANAX) 1 MG tablet Take 1 mg by mouth at bedtime as needed for anxiety.     [name] amLODipine (NORVASC) 5 MG tablet Take 5 mg by mouth daily.     . cyclobenzaprine (FLEXERIL) 10 MG tablet Take 10 mg by mouth every 6 (six) hours as needed for muscle spasms.     . ondansetron (ZOFRAN-ODT) 4 MG disintegrating tablet Take 4 mg by mouth every 6 (six) hours as needed for nausea or vomiting.     [name] oxyCODONE (ROXICODONE) 15 MG immediate release tablet Take 10 mg by mouth every 4 (four) hours as needed for pain.     [name] acetaminophen (TYLENOL) 500 MG tablet Take 500 mg by mouth every 6 (six) hours as needed for pain or fever.   unk  . alum & mag hydroxide-simeth (MAALOX/MYLANTA) 200-200-20 MG/5ML suspension Take 10-20 mLs by mouth every 6 (six) hours as needed for indigestion.   unk  . anti-nausea (EMETROL) solution Take 15-30 mLs by mouth every 15 (fifteen) minutes as needed for nausea.   unk  .  Benzocaine 10 MG LOZG Use as directed 1 lozenge in the mouth or throat every 4 (four) hours as needed (sore throat).   unk  . benzonatate (TESSALON) 100 MG capsule Take 200 mg by mouth 3 (three) times daily as needed for cough.   unk  . cetirizine (ZYRTEC) 10 MG tablet Take 10 mg by mouth daily as needed for allergies.   unk  . diazepam (VALIUM) 5 MG/ML solution Inject 10 mg into the muscle once as needed (STAT for seizures).   unk  . guaiFENesin (MUCINEX) 600 MG 12 hr tablet Take 600 mg by mouth 2 (two) times daily.   unk  . ibuprofen (ADVIL,MOTRIN) 600 MG tablet Take 600 mg by mouth every 6 (six) hours as needed for pain.   unk  . insulin aspart (NOVOLOG) 100 UNIT/ML injection Inject 2-10 Units into the skin 3 (three) times daily with meals as needed for high blood sugar (based on sliding scale: 200 or below 0 units, 201-250 2 units, 251-300 4 units, 301-350 6 units, 351-400  8 units, above 400 10 units and call MD).   02/13/13  . insulin glargine (LANTUS) 100 UNIT/ML injection Inject 15 Units into the skin at bedtime.   01/25/13  . lisinopril (PRINIVIL,ZESTRIL) 10 MG tablet Take 10 mg by mouth daily.   01/25/13  . Loperamide HCl (IMODIUM A-D PO) Take 1-2 tablets by mouth See admin instructions. 2 tablets at onset of diarrhea, then 1 tablet following each diarrhea stool. Max 8 tablets per day.   unk  . metoprolol succinate (TOPROL-XL) 100 MG 24 hr tablet Take 100 mg by mouth daily. Take with or immediately following a meal.   01/25/13 at unk  . Multiple Vitamin (MULTIVITAMIN WITH MINERALS) TABS Take 1 tablet by mouth daily.   02/14/2013 at Unknown  . ondansetron (ZOFRAN) 8 MG tablet Take 8 mg by mouth 2 (two) times daily as needed for nausea.   unk  . OVER THE COUNTER MEDICATION Take 1 tablet by mouth See admin instructions. No frequency-Alert MD after 4 consecutive stools   unk  . polyethylene glycol (MIRALAX / GLYCOLAX) packet Take 17 g by mouth daily.   01/25/13  . thiamine (VITAMIN B-1) 100 MG tablet Take 100 mg by mouth daily.   01/25/13 at Unknown  . traMADol (ULTRAM) 50 MG tablet Take 1-2 tablets (50-100 mg total) by mouth every 8 (eight) hours as needed for pain. 17 tablet 0     Patient Stressors: Health problems Marital or family conflict Substance abuse  Patient Strengths: Advertising account executive for treatment/growth  Treatment Modalities: Medication Management, Group therapy, Case management,  1 to 1 session with clinician, Psychoeducation, Recreational therapy.   Physician Treatment Plan for Primary Diagnosis: MDD (major depressive disorder), recurrent severe, without psychosis (HCC) Long Term Goal(s):     Short Term Goals:    Medication Management: Evaluate patient's response, side effects, and tolerance of medication regimen.  Therapeutic Interventions: 1 to 1 sessions, Unit Group sessions and Medication  administration.  Evaluation of Outcomes: Progressing  Physician Treatment Plan for Secondary Diagnosis: Principal Problem:   MDD (major depressive disorder), recurrent severe, without psychosis (HCC) Active Problems:   Opiate addiction (HCC)  Long Term Goal(s):     Short Term Goals:       Medication Management: Evaluate patient's response, side effects, and tolerance of medication regimen.  Therapeutic Interventions: 1 to 1 sessions, Unit Group sessions and Medication administration.  Evaluation of Outcomes: Progressing   RN  Treatment Plan for Primary Diagnosis: MDD (major depressive disorder), recurrent severe, without psychosis (HCC) Long Term Goal(s): Knowledge of disease and therapeutic regimen to maintain health will improve  Short Term Goals: Ability to remain free from injury will improve, Ability to disclose and discuss suicidal ideas and Ability to identify and develop effective coping behaviors will improve  Medication Management: RN will administer medications as ordered by provider, will assess and evaluate patient's response and provide education to patient for prescribed medication. RN will report any adverse and/or side effects to prescribing provider.  Therapeutic Interventions: 1 on 1 counseling sessions, Psychoeducation, Medication administration, Evaluate responses to treatment, Monitor vital signs and CBGs as ordered, Perform/monitor CIWA, COWS, AIMS and Fall Risk screenings as ordered, Perform wound care treatments as ordered.  Evaluation of Outcomes: Progressing   LCSW Treatment Plan for Primary Diagnosis: MDD (major depressive disorder), recurrent severe, without psychosis (HCC) Long Term Goal(s): Safe transition to appropriate next level of care at discharge, Engage patient in therapeutic group addressing interpersonal concerns.  Short Term Goals: Engage patient in aftercare planning with referrals and resources, Facilitate patient progression through stages  of change regarding substance use diagnoses and concerns and Identify triggers associated with mental health/substance abuse issues  Therapeutic Interventions: Assess for all discharge needs, 1 to 1 time with Social worker, Explore available resources and support systems, Assess for adequacy in community support network, Educate family and significant other(s) on suicide prevention, Complete Psychosocial Assessment, Interpersonal group therapy.  Evaluation of Outcomes: Progressing   Progress in Treatment: Attending groups: No. Participating in groups: No. New to unit. Continuing to assess.  Taking medication as prescribed: Yes. Toleration medication: Yes. Family/Significant other contact made: No, will contact:  family member if patient consents.--pt wants to call his wife together this afternoon to discuss treatment and aftercare plan.  Patient understands diagnosis: Yes. Discussing patient identified problems/goals with staff: Yes. Medical problems stabilized or resolved: Yes. Denies suicidal/homicidal ideation: Yes. Issues/concerns per patient self-inventory: No. Other: n/a  New problem(s) identified: No, Describe:  n/a  New Short Term/Long Term Goal(s):  Discharge Plan or Barriers: [facility]. No current providers.   Reason for Continuation of Hospitalization: Depression Medication stabilization Withdrawal symptoms  Estimated Length of Stay: 1-3 days   Attendees: Patient: 11/26/2016 9:01 AM  Physician: Dr. Elna BreslowEappen MD 11/26/2016 9:01 AM  Nursing: Gavin Poundoni, Caroline RN 11/26/2016 9:01 AM  RN Care Manager: Juliann ParesX 11/26/2016 9:01 AM  Social Worker: Herbert SetaHeather Smart, LCSW; Chad CordialLauren Carter LCSW 11/26/2016 9:01 AM  Recreational Therapist:  11/26/2016 9:01 AM  Other: Claudette Headonrad Withrow NP 11/26/2016 9:01 AM  Other:  11/26/2016 9:01 AM  Other: 11/26/2016 9:01 AM    Scribe for Treatment Team: Ledell PeoplesHeather N Smart,  LCSW 11/26/2016 9:01 AM

## 2016-11-26 NOTE — [facility] Suicide Risk Assessment (Signed)
[facility]   Nursing information obtained from:  Patient Demographic factors:  Male, Caucasian Current Mental Status:  NA Loss Factors:  Decrease in vocational status, Decline in physical health, Financial problems / change in socioeconomic status Historical Factors:  NA Risk Reduction Factors:  Living with another person, especially a relative  Total Time spent with patient: 30 minutes Principal Problem: MDD (major depressive disorder), recurrent severe, without psychosis (HCC) Diagnosis:   Patient Active Problem List   Diagnosis Date Noted  . Opioid use disorder, moderate, dependence (HCC) [F11.20] 11/26/2016  . History of neck surgery [Z98.890] 11/26/2016  . MDD (major depressive disorder), recurrent severe, without psychosis (HCC) [F33.2] 11/25/2016   Subjective Data: Pt seen as depressed , had SI with plan to OD - has several stressors .   Continued Clinical Symptoms:  Alcohol Use Disorder Identification Test Final Score (AUDIT): 2 The "Alcohol Use Disorders Identification Test", Guidelines for Use in Primary Care, Second Edition.  World Science writerHealth Organization [facility](WHO). Score between 0-7:  no or low risk or alcohol related problems. Score between 8-15:  moderate risk of alcohol related problems. Score between 16-19:  high risk of alcohol related problems. Score 20 or above:  warrants further diagnostic evaluation for alcohol dependence and treatment.   CLINICAL FACTORS:   Depression:   Comorbid alcohol abuse/dependence Impulsivity Alcohol/Substance Abuse/Dependencies Chronic Pain Previous Psychiatric Diagnoses and Treatments   Musculoskeletal: Strength & Muscle Tone: within normal limits Gait & Station: normal Patient leans: N/A  Psychiatric Specialty Exam: Physical Exam  Review of Systems  Musculoskeletal: Positive for back pain and neck pain.  Psychiatric/Behavioral: Positive for depression, substance abuse and suicidal ideas. The patient is  nervous/anxious and has insomnia.   All other systems reviewed and are negative.   Blood pressure 133/81, pulse 84, temperature 97.7 F (36.5 C), temperature source Oral, resp. rate 20, height 6\' 1"  (1.854 m), weight 81.6 kg (180 lb), SpO2 100 %.Body mass index is 23.75 kg/m.  General Appearance: Casual  Eye Contact:  Fair  Speech:  Clear and Coherent  Volume:  Normal  Mood:  Anxious, Depressed and Dysphoric  Affect:  Appropriate  Thought Process:  Goal Directed and Descriptions of Associations: Intact  Orientation:  Full (Time, Place, and Person)  Thought Content:  Rumination  Suicidal Thoughts:  Yes.  without intent/plan  Homicidal Thoughts:  No  Memory:  Immediate;   Fair Recent;   Fair Remote;   Fair  Judgement:  Impaired  Insight:  Shallow  Psychomotor Activity:  Normal  Concentration:  Concentration: Fair and Attention Span: Fair  Recall:  FiservFair  Fund of Knowledge:  Fair  Language:  Fair  Akathisia:  No  Handed:  Right  AIMS (if indicated):     Assets:  Communication Skills Desire for Improvement  ADL's:  Intact  Cognition:  WNL  Sleep:         COGNITIVE FEATURES THAT CONTRIBUTE TO RISK:  Closed-mindedness, Polarized thinking and Thought constriction (tunnel vision)    SUICIDE RISK:   Moderate:  Frequent suicidal ideation with limited intensity, and duration, some specificity in terms of plans, no associated intent, good self-control, limited dysphoria/symptomatology, some risk factors present, and identifiable protective factors, including available and accessible social support.   PLAN OF CARE: Case discussed with NP. Pt is on clonidine protocol - will make use of alternative pain management since he was hoarding opioids and also abusing them. Pt also be started on Librium/ciwa prn for xanax ? Misuse .  Pt was prescribed both opioids and xanax ( as per [place] controlled substance database) by his out patient provider - s/p surgery - but he was misusing them. Will  consider starting an antidepressant .  I certify that inpatient services furnished can reasonably be expected to improve the patient's condition.  Mc Bloodworth, MD 11/26/2016, 11:41 AM
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
## 2016-11-26 NOTE — [facility] Suicide Risk Assessment (Addendum)
[facility] INPATIENT:  Family/Significant Other Suicide Prevention Education  Suicide Prevention Education:  Education Completed; Raymond GarfinkelKaren Kravitz (pt's wife) [phone] has been identified by the patient as the family member/significant other with whom the patient will be residing, and identified as the person(s) who will aid the patient in the event of a mental health crisis (suicidal ideations/suicide attempt).  With written consent from the patient, the family member/significant other has been provided the following suicide prevention education, prior to the and/or following the discharge of the patient.  The suicide prevention education provided includes the following:  Suicide risk factors  Suicide prevention and interventions  National Suicide Hotline telephone number  [facility] assessment telephone number  [facility] Emergency Assistance 911  [facility] and/or Residential Mobile Crisis Unit telephone number  Request made of family/significant other to:  Remove weapons (e.g., guns, rifles, knives), all items previously/currently identified as safety concern.    Remove drugs/medications (over-the-counter, prescriptions, illicit drugs), all items previously/currently identified as a safety concern.  The family member/significant other verbalizes understanding of the suicide prevention education information provided.  The family member/significant other agrees to remove the items of safety concern listed above.  Pt's wife states that guns are not in home and she is supportive of his plan to [facility]. She is receptive to AlAnon/NarcAnon/NAMI information as well.   Latiya Navia N Smart LCSW 11/26/2016, 2:11 PM

## 2016-11-27 DIAGNOSIS — Z9049 Acquired absence of other specified parts of digestive tract: Secondary | ICD-10-CM

## 2016-11-27 DIAGNOSIS — Z794 Long term (current) use of insulin: Secondary | ICD-10-CM

## 2016-11-27 DIAGNOSIS — F17210 Nicotine dependence, cigarettes, uncomplicated: Secondary | ICD-10-CM

## 2016-11-27 LAB — GLUCOSE, CAPILLARY
GLUCOSE-CAPILLARY: 138 mg/dL — AB (ref 65–99)
GLUCOSE-CAPILLARY: 118 mg/dL — AB (ref 65–99)
GLUCOSE-CAPILLARY: 111 mg/dL — AB (ref 65–99)
Glucose-Capillary: 152 mg/dL — ABNORMAL HIGH (ref 65–99)

## 2016-11-27 MED ORDER — DULOXETINE HCL 20 MG PO CPEP
40.0000 mg | ORAL_CAPSULE | Freq: Every day | ORAL | Status: DC
  Administered 2016-11-28 – 2016-12-02 (×5): 40 mg via ORAL
  Filled 2016-11-27 (×6): qty 2

## 2016-11-27 NOTE — Progress Notes (Signed)
Pt has phone screening with [facility] at 11:00AM for possible admission on Friday, 11/29/16.  Trula SladeHeather Smart, MSW, LCSW Clinical Social Worker 11/27/2016 11:05 AM

## 2016-11-27 NOTE — [facility] Group Notes (Signed)
[facility] LCSW Group Therapy  11/27/2016 3:48 PM  Type of Therapy:  Group Therapy  Participation Level:  Active  Participation Quality:  Attentive  Affect:  Appropriate  Cognitive:  Appropriate  Insight:  Improving  Engagement in Therapy:  Engaged  Modes of Intervention:  Discussion, Education, Exploration, Problem-solving, Rapport Building, Socialization and Support  Summary of Progress/Problems: Emotion Regulation: This group focused on both positive and negative emotion identification and allowed group members to process ways to identify feelings, regulate negative emotions, and find healthy ways to manage internal/external emotions. Group members were asked to reflect on a time when their reaction to an emotion led to a negative outcome and explored how alternative responses using emotion regulation would have benefited them. Group members were also asked to discuss a time when emotion regulation was utilized when a negative emotion was experienced.   Flavia Bruss N Smart LCSW 11/27/2016, 3:48 PM

## 2016-11-27 NOTE — Progress Notes (Signed)
Patient ID: Raymond MelterMichael James Melton, male   DOB: [birth year], 61 y.o.   MRN: [id number]  DAR: Pt. Denies SI/HI and A/V Hallucinations. He reports that he is not having a good day but does not elaborate to this Clinical research associatewriter. Patient does not report any pain or discomfort at this time. Support and encouragement provided to the patient to come to staff with questions or concerns. Scheduled medications administered to patient per physician's orders. Patient is minimal but cooperative. He has flat affect and depressed mood. He is seen in the milieu usually walking up and down the hall in a non-aggressive manner. Q15 minute checks are maintained for safety.
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
## 2016-11-27 NOTE — Progress Notes (Signed)
Recreation Therapy Notes  Date: 11/27/16 Time: 0930 Location: 300 Hall Dayroom  Group Topic: Stress Management  Goal Area(s) Addresses:  Patient will verbalize importance of using healthy stress management.  Patient will identify positive emotions associated with healthy stress management.   Intervention: Stress Management  Activity :  Letting Go Meditation.  LRT introduced to stress management technique of meditation.  LRT played a meditation focused on letting go of the past and focusing on the now from the Calm app.  Patients were to follow along as the meditation played to fully engage in the technique.  Education:  Stress Management, Discharge Planning.   Education Outcome: Acknowledges edcuation/In group clarification offered/Needs additional education  Clinical Observations/Feedback: Pt did not attend group.    Caroll RancherMarjette Yerik Zeringue, LRT/CTRS         Caroll RancherLindsay, Marina Desire A 11/27/2016 11:31 AM

## 2016-11-27 NOTE — Progress Notes (Signed)
[facility] MD Progress Note  11/27/2016 2:27 PM Raymond Melton  MRN:  [id number] Subjective:  Patient states that he will be going to [place].  He states that he has any withdrawal symptoms. Objective:  Patient is admitted to [facility] for substance abuse.  He reported upon admission that his wife left him and that she will only reconcile with him after he gets help.  Patient is pleasant.  He does c/o of dysphagia after neck surgery on 11/06/16.  Otherwise, he feels stable on his protocol.    Principal Problem: MDD (major depressive disorder), recurrent severe, without psychosis (HCC) Diagnosis:   Patient Active Problem List   Diagnosis Date Noted  . Opioid use disorder, moderate, dependence (HCC) [F11.20] 11/26/2016  . History of neck surgery [Z98.890] 11/26/2016  . Hx of hepatitis C [Z86.19] 11/26/2016  . Hypertension [I10] 11/26/2016  . Diabetes (HCC) [E11.9] 11/26/2016  . MDD (major depressive disorder), recurrent severe, without psychosis (HCC) [F33.2] 11/25/2016   Total Time spent with patient: 30 minutes  Past Psychiatric History: see HPI  Past Medical History:  Past Medical History:  Diagnosis Date  . Alcohol abuse   . Chronic pain   . Diabetes mellitus without complication (HCC)   . Hepatitis C   . Hypertension   . Immune deficiency disorder (HCC)   . Kidney stones   . TB lung, latent     Past Surgical History:  Procedure Laterality Date  . ABDOMINAL SURGERY    . ANKLE SURGERY    . CHOLECYSTECTOMY    . ELBOW SURGERY    . EYE SURGERY    . HERNIA REPAIR    . WRIST SURGERY     Family History:  Family History  Problem Relation Age of Onset  . Depression Mother   . Hypertension Mother    Family Psychiatric  History:  See HPI Social History:  History  Alcohol Use  . Yes    Comment: daily     History  Drug Use No    Social History   Social History  . Marital status: Married    Spouse name: N/A  . Number of children: N/A  . Years of education: N/A   Social  History Main Topics  . Smoking status: Current Some Day Smoker    Packs/day: 0.50    Types: Cigarettes  . Smokeless tobacco: Never Used  . Alcohol use Yes     Comment: daily  . Drug use: No  . Sexual activity: Not Currently   Other Topics Concern  . None   Social History Narrative  . None   Additional Social History:    Pain Medications: Oxycodone  Prescriptions: see PTA Meds Over the Counter: see PTA meds History of alcohol / drug use?: Yes Name of Substance 1: Oxycodone 1 - Age of First Use: years 1 - Amount (size/oz): 10mg  1 - Frequency: 6-8 pills per day 1 - Duration: years 1 - Last Use / Amount: yesterday                  Sleep: Fair  Appetite:  Fair  Current Medications: Current Facility-Administered Medications  Medication Dose Route Frequency Provider Last Rate Last Dose  . acetaminophen (TYLENOL) tablet 650 mg  650 mg Oral Q6H PRN Craige Cotta, MD      . alum & mag hydroxide-simeth (MAALOX/MYLANTA) 200-200-20 MG/5ML suspension 30 mL  30 mL Oral Q4H PRN Laveda Abbe, NP      . chlordiazePOXIDE (LIBRIUM) capsule  25 mg  25 mg Oral Q6H PRN Jomarie LongsSaramma Eappen, MD   25 mg at 11/27/16 0157  . cloNIDine (CATAPRES) tablet 0.1 mg  0.1 mg Oral QID Craige CottaFernando A Cobos, MD   0.1 mg at 11/27/16 1208   Followed by  . [START ON 11/28/2016] cloNIDine (CATAPRES) tablet 0.1 mg  0.1 mg Oral BH-qamhs Craige CottaFernando A Cobos, MD       Followed by  . [START ON 11/30/2016] cloNIDine (CATAPRES) tablet 0.1 mg  0.1 mg Oral QAC breakfast Rockey SituFernando A Cobos, MD      . dicyclomine (BENTYL) tablet 20 mg  20 mg Oral Q6H PRN Craige CottaFernando A Cobos, MD   20 mg at 11/26/16 0811  . DULoxetine (CYMBALTA) DR capsule 30 mg  30 mg Oral Daily Jomarie LongsSaramma Eappen, MD   30 mg at 11/27/16 0851  . hydrOXYzine (ATARAX/VISTARIL) tablet 25 mg  25 mg Oral Q6H PRN Craige CottaFernando A Cobos, MD   25 mg at 11/26/16 1954  . insulin aspart (novoLOG) injection 0-15 Units  0-15 Units Subcutaneous TID WC Jomarie LongsSaramma Eappen, MD   3 Units at  11/27/16 1208  . insulin aspart (novoLOG) injection 0-5 Units  0-5 Units Subcutaneous QHS Saramma Eappen, MD      . loperamide (IMODIUM) capsule 2-4 mg  2-4 mg Oral PRN Rockey SituFernando A Cobos, MD      . magnesium hydroxide (MILK OF MAGNESIA) suspension 30 mL  30 mL Oral Daily PRN Laveda AbbeLaurie Britton Parks, NP      . methocarbamol (ROBAXIN) tablet 500 mg  500 mg Oral Q8H PRN Craige CottaFernando A Cobos, MD      . naproxen (NAPROSYN) tablet 500 mg  500 mg Oral BID PRN Craige CottaFernando A Cobos, MD   500 mg at 11/26/16 2120  . nicotine polacrilex (NICORETTE) gum 2 mg  2 mg Oral PRN Craige CottaFernando A Cobos, MD      . ondansetron (ZOFRAN-ODT) disintegrating tablet 4 mg  4 mg Oral Q6H PRN Craige CottaFernando A Cobos, MD   4 mg at 11/27/16 1211  . traZODone (DESYREL) tablet 50 mg  50 mg Oral QHS PRN Craige CottaFernando A Cobos, MD   50 mg at 11/26/16 2120    Lab Results:  Results for orders placed or performed during the hospital encounter of 11/25/16 (from the past 48 hour(s))  Glucose, capillary     Status: Abnormal   Collection Time: 11/25/16  9:25 PM  Result Value Ref Range   Glucose-Capillary 163 (H) 65 - 99 mg/dL   Comment 1 Notify RN   Glucose, capillary     Status: Abnormal   Collection Time: 11/26/16  6:23 AM  Result Value Ref Range   Glucose-Capillary 140 (H) 65 - 99 mg/dL  Glucose, capillary     Status: Abnormal   Collection Time: 11/26/16 11:52 AM  Result Value Ref Range   Glucose-Capillary 118 (H) 65 - 99 mg/dL  Glucose, capillary     Status: Abnormal   Collection Time: 11/26/16  5:21 PM  Result Value Ref Range   Glucose-Capillary 123 (H) 65 - 99 mg/dL  Glucose, capillary     Status: Abnormal   Collection Time: 11/26/16  8:52 PM  Result Value Ref Range   Glucose-Capillary 110 (H) 65 - 99 mg/dL  Glucose, capillary     Status: Abnormal   Collection Time: 11/27/16  5:20 AM  Result Value Ref Range   Glucose-Capillary 138 (H) 65 - 99 mg/dL  Glucose, capillary     Status: Abnormal   Collection Time: 11/27/16  12:04 PM  Result Value Ref  Range   Glucose-Capillary 152 (H) 65 - 99 mg/dL   Comment 1 Document in Chart     Blood Alcohol level:  Lab Results  Component Value Date   [facility] <11 02/15/2013   ETH <11 01/13/2013    Metabolic Disorder Labs: No results found for: HGBA1C, MPG No results found for: PROLACTIN No results found for: CHOL, TRIG, HDL, CHOLHDL, VLDL, LDLCALC  Physical Findings: AIMS: Facial and Oral Movements Muscles of Facial Expression: None, normal Lips and Perioral Area: None, normal Jaw: None, normal Tongue: None, normal,Extremity Movements Upper (arms, wrists, hands, fingers): None, normal Lower (legs, knees, ankles, toes): None, normal, Trunk Movements Neck, shoulders, hips: None, normal, Overall Severity Severity of abnormal movements (highest score from questions above): None, normal Incapacitation due to abnormal movements: None, normal Patient's awareness of abnormal movements (rate only patient's report): No Awareness, Dental Status Current problems with teeth and/or dentures?: No Does patient usually wear dentures?: No  CIWA:  CIWA-Ar Total: 3 COWS:  COWS Total Score: 1  Musculoskeletal: Strength & Muscle Tone: within normal limits Gait & Station: normal Patient leans: N/A  Psychiatric Specialty Exam: Physical Exam  Nursing note and vitals reviewed. Psychiatric: He has a normal mood and affect. His speech is normal and behavior is normal. Judgment and thought content normal. Cognition and memory are normal.    ROS  Blood pressure 116/66, pulse 94, temperature 98.2 F (36.8 C), temperature source Oral, resp. rate 18, height 6\' 1"  (1.854 m), weight 81.6 kg (180 lb), SpO2 100 %.Body mass index is 23.75 kg/m.  General Appearance: Neat  Eye Contact:  Good  Speech:  Clear and Coherent  Volume:  Normal  Mood:  Anxious  Affect:  Appropriate  Thought Process:  Coherent  Orientation:  Full (Time, Place, and Person)  Thought Content:  Rumination  Suicidal Thoughts:  No  Homicidal  Thoughts:  No  Memory:  Immediate;   Fair Recent;   Fair Remote;   Fair  Judgement:  Fair  Insight:  Fair  Psychomotor Activity:  Normal  Concentration:  Concentration: Fair and Attention Span: Fair  Recall:  [employer] of Knowledge:  Fair  Language:  Fair  Akathisia:  No  Handed:  Right  AIMS (if indicated):     Assets:  Desire for Improvement Resilience Social Support  ADL's:  Intact  Cognition:  WNL  Sleep:  Number of Hours: 3   Treatment Plan Summary: Review of chart, vital signs, medications, and notes.  1-Individual and group therapy  2-Medication management for depression and anxiety: Medications reviewed with the patient.  Librium Q6hrs CIWA withdrawal, Clonidine opiate withdrawal.  Duloxetine DR increased to 40 mg MDD 3-Coping skills for depression, anxiety  4-Continue crisis stabilization and management  5-Address health issues--monitoring vital signs, stable  6-Treatment plan in progress to prevent relapse of depression and anxiety  Lindwood Qua, NP  [facility] 11/27/2016, 2:27 PM

## 2016-11-27 NOTE — Plan of Care (Signed)
Problem: Safety: Goal: Periods of time without injury will increase Outcome: Progressing Pt. denies SI/HI/AVH at this time, remains a high fall risk, Q 15 checks in effect.

## 2016-11-28 ENCOUNTER — Inpatient Hospital Stay (HOSPITAL_COMMUNITY): Payer: Medicare Other

## 2016-11-28 LAB — HEMOGLOBIN A1C
HEMOGLOBIN A1C: 6 % — AB (ref 4.8–5.6)
Mean Plasma Glucose: 126 mg/dL

## 2016-11-28 LAB — GLUCOSE, CAPILLARY
GLUCOSE-CAPILLARY: 130 mg/dL — AB (ref 65–99)
Glucose-Capillary: 136 mg/dL — ABNORMAL HIGH (ref 65–99)
Glucose-Capillary: 166 mg/dL — ABNORMAL HIGH (ref 65–99)
Glucose-Capillary: 101 mg/dL — ABNORMAL HIGH (ref 65–99)

## 2016-11-28 MED ORDER — TRAZODONE HCL 100 MG PO TABS
100.0000 mg | ORAL_TABLET | Freq: Every day | ORAL | Status: DC
  Administered 2016-11-28 – 2016-12-01 (×4): 100 mg via ORAL
  Filled 2016-11-28 (×6): qty 1

## 2016-11-28 NOTE — Progress Notes (Addendum)
  DATA ACTION RESPONSE  Objective- Pt. is up and visible in the room, resting in bed. Pt. presents with a depressed/anxious affect and mood. Pt. appears to be withdrawn/isolative from the milieu. Both snack and fluid offered to Pt, snack refused. Subjective- Denies having any SI/HI/AVH/Pain at this time. Pt. states " I am just feeling depressed".              Pt. continues to be cooperative and remain safe & pleasant on the unit.  1:1 interaction in private to establish rapport. Encouragement, education, & support given from staff. Meds. ordered and administered. PRN Vistaril,  Trazodone, & Librium requested and will re-eval accordingly. BS 111 @ bedtime. No coverage needed.   Safety maintained with Q 15 checks. Continues to follow treatment plan and will monitor closely. No additonal questions/concerns noted.

## 2016-11-28 NOTE — [facility] Group Notes (Signed)
The focus of this group is to educate the patient on the purpose and policies of crisis stabilization and provide a format to answer questions about their admission.  The group details unit policies and expectations of patients while admitted.  Patient did not attend 0900 nurse education orientation group this morning.  Patient stayed in bed.   

## 2016-11-28 NOTE — Progress Notes (Signed)
Patient ID: Raymond Melton, male   DOB: March 19, 1956, 61 y.o.   MRN: [id number]  Patient's MRI scheduled for 8 pm this evening. Patient is aware. This was scheduled by Kipp BroodBrent in radiology/MRI dept.

## 2016-11-28 NOTE — Progress Notes (Signed)
Patient ID: Raymond MelterMichael James Melton, male   DOB: 03-Aug-1956, 61 y.o.   MRN: [id number]  DAR: Pt. Denies SI/HI and A/V Hallucinations. He reports sleep is fair, appetite is fair, energy level is low, and concentration is good. He rates depression, hopelessness, and anxiety 5/10. Patient does reports pain intermittently throughout the day and PRN medication administered but patient reports no relief. He states, "I know I am just going to have to get used to the pain." Support and encouragement provided to the patient. Scheduled medications administered to patient per physician's orders. Patient is cooperative and is seen in the milieu intermittently. He appears more tired today, napping throughout the day. Q15 minute checks are maintained for safety.

## 2016-11-28 NOTE — Progress Notes (Signed)
[facility] MD Progress Note  11/28/2016 3:08 PM Raymond Melton  MRN:  [id number]  Subjective: "Raymond Melton reports, "I'm still depressed, but the depression medicine is helping me. I think my mood is improving".  Objective:  Patient is admitted to [facility] for substance abuse.  He reported upon admission that his wife left him and that she will only reconcile with him after he gets help.  Patient is pleasant.    Raymond Melton is seen, chart reviewed. He is resting in his bed. Alert & oriented x 4. He says his mood is improving & his antidepressant medication helping. He currently denies any SIHI, AVH, delusional thoughts or paranoia. He complain of insomnia. Will change Trazodone from PRN to routine. He is encouraged to participate in the group sessions.   Principal Problem: MDD (major depressive disorder), recurrent severe, without psychosis (HCC)  Diagnosis:   Patient Active Problem List   Diagnosis Date Noted  . Opioid use disorder, moderate, dependence (HCC) [F11.20] 11/26/2016  . History of neck surgery [Z98.890] 11/26/2016  . Hx of hepatitis C [Z86.19] 11/26/2016  . Hypertension [I10] 11/26/2016  . Diabetes (HCC) [E11.9] 11/26/2016  . MDD (major depressive disorder), recurrent severe, without psychosis (HCC) [F33.2] 11/25/2016   Total Time spent with patient: 15 minutes  Past Psychiatric History: See HPI  Past Medical History:  Past Medical History:  Diagnosis Date  . Alcohol abuse   . Chronic pain   . Diabetes mellitus without complication (HCC)   . Hepatitis C   . Hypertension   . Immune deficiency disorder (HCC)   . Kidney stones   . TB lung, latent     Past Surgical History:  Procedure Laterality Date  . ABDOMINAL SURGERY    . ANKLE SURGERY    . CHOLECYSTECTOMY    . ELBOW SURGERY    . EYE SURGERY    . HERNIA REPAIR    . WRIST SURGERY     Family History:  Family History  Problem Relation Age of Onset  . Depression Mother   . Hypertension Mother    Family Psychiatric  History:   See HPI  Social History:  History  Alcohol Use  . Yes    Comment: daily     History  Drug Use No    Social History   Social History  . Marital status: Married    Spouse name: N/A  . Number of children: N/A  . Years of education: N/A   Social History Main Topics  . Smoking status: Current Some Day Smoker    Packs/day: 0.50    Types: Cigarettes  . Smokeless tobacco: Never Used  . Alcohol use Yes     Comment: daily  . Drug use: No  . Sexual activity: Not Currently   Other Topics Concern  . None   Social History Narrative  . None   Additional Social History:    Pain Medications: Oxycodone  Prescriptions: see PTA Meds Over the Counter: see PTA meds History of alcohol / drug use?: Yes Name of Substance 1: Oxycodone 1 - Age of First Use: years 1 - Amount (size/oz): 10mg  1 - Frequency: 6-8 pills per day 1 - Duration: years 1 - Last Use / Amount: yesterday  Sleep: Fair  Appetite:  Fair  Current Medications: Current Facility-Administered Medications  Medication Dose Route Frequency Provider Last Rate Last Dose  . acetaminophen (TYLENOL) tablet 650 mg  650 mg Oral Q6H PRN Craige Cotta, MD      . alum &  mag hydroxide-simeth (MAALOX/MYLANTA) 200-200-20 MG/5ML suspension 30 mL  30 mL Oral Q4H PRN Laveda Abbe, NP      . chlordiazePOXIDE (LIBRIUM) capsule 25 mg  25 mg Oral Q6H PRN Jomarie Longs, MD   25 mg at 11/28/16 0224  . cloNIDine (CATAPRES) tablet 0.1 mg  0.1 mg Oral BH-qamhs Rockey Situ Jezlyn Westerfield, MD   0.1 mg at 11/28/16 0826   Followed by  . [START ON 11/30/2016] cloNIDine (CATAPRES) tablet 0.1 mg  0.1 mg Oral QAC breakfast Rockey Situ Kyndahl Jablon, MD      . dicyclomine (BENTYL) tablet 20 mg  20 mg Oral Q6H PRN Craige Cotta, MD   20 mg at 11/26/16 0811  . DULoxetine (CYMBALTA) DR capsule 40 mg  40 mg Oral Daily Adonis Brook, NP   40 mg at 11/28/16 0825  . hydrOXYzine (ATARAX/VISTARIL) tablet 25 mg  25 mg Oral Q6H PRN Craige Cotta, MD   25 mg at  11/27/16 2137  . insulin aspart (novoLOG) injection 0-15 Units  0-15 Units Subcutaneous TID WC Jomarie Longs, MD   2 Units at 11/28/16 1205  . insulin aspart (novoLOG) injection 0-5 Units  0-5 Units Subcutaneous QHS Saramma Eappen, MD      . loperamide (IMODIUM) capsule 2-4 mg  2-4 mg Oral PRN Rockey Situ Glendora Clouatre, MD      . magnesium hydroxide (MILK OF MAGNESIA) suspension 30 mL  30 mL Oral Daily PRN Laveda Abbe, NP      . methocarbamol (ROBAXIN) tablet 500 mg  500 mg Oral Q8H PRN Craige Cotta, MD   500 mg at 11/28/16 1128  . naproxen (NAPROSYN) tablet 500 mg  500 mg Oral BID PRN Craige Cotta, MD   500 mg at 11/28/16 1128  . nicotine polacrilex (NICORETTE) gum 2 mg  2 mg Oral PRN Craige Cotta, MD      . ondansetron (ZOFRAN-ODT) disintegrating tablet 4 mg  4 mg Oral Q6H PRN Craige Cotta, MD   4 mg at 11/27/16 1211  . traZODone (DESYREL) tablet 50 mg  50 mg Oral QHS PRN Craige Cotta, MD   50 mg at 11/27/16 2137   Lab Results:  Results for orders placed or performed during the hospital encounter of 11/25/16 (from the past 48 hour(s))  Glucose, capillary     Status: Abnormal   Collection Time: 11/26/16  5:21 PM  Result Value Ref Range   Glucose-Capillary 123 (H) 65 - 99 mg/dL  Glucose, capillary     Status: Abnormal   Collection Time: 11/26/16  8:52 PM  Result Value Ref Range   Glucose-Capillary 110 (H) 65 - 99 mg/dL  Glucose, capillary     Status: Abnormal   Collection Time: 11/27/16  5:20 AM  Result Value Ref Range   Glucose-Capillary 138 (H) 65 - 99 mg/dL  Hemoglobin V7Q     Status: Abnormal   Collection Time: 11/27/16  6:34 AM  Result Value Ref Range   Hgb A1c MFr Bld 6.0 (H) 4.8 - 5.6 %    Comment: (NOTE)         Pre-diabetes: 5.7 - 6.4         Diabetes: >6.4         Glycemic control for adults with diabetes: <7.0    Mean Plasma Glucose 126 mg/dL    Comment: (NOTE) Performed At: [facility] [address] [place], [place] [id number] Mila Homer MD [id number] Performed at [facility]  Hospital   Glucose, capillary     Status: Abnormal   Collection Time: 11/27/16 12:04 PM  Result Value Ref Range   Glucose-Capillary 152 (H) 65 - 99 mg/dL   Comment 1 Document in Chart   Glucose, capillary     Status: Abnormal   Collection Time: 11/27/16  5:29 PM  Result Value Ref Range   Glucose-Capillary 118 (H) 65 - 99 mg/dL   Comment 1 Notify RN   Glucose, capillary     Status: Abnormal   Collection Time: 11/27/16  9:19 PM  Result Value Ref Range   Glucose-Capillary 111 (H) 65 - 99 mg/dL  Glucose, capillary     Status: Abnormal   Collection Time: 11/28/16  6:02 AM  Result Value Ref Range   Glucose-Capillary 136 (H) 65 - 99 mg/dL  Glucose, capillary     Status: Abnormal   Collection Time: 11/28/16 11:57 AM  Result Value Ref Range   Glucose-Capillary 130 (H) 65 - 99 mg/dL    Blood Alcohol level:  Lab Results  Component Value Date   [facility] <11 02/15/2013   ETH <11 01/13/2013    Metabolic Disorder Labs: Lab Results  Component Value Date   HGBA1C 6.0 (H) 11/27/2016   MPG 126 11/27/2016   No results found for: PROLACTIN No results found for: CHOL, TRIG, HDL, CHOLHDL, VLDL, LDLCALC  Physical Findings: AIMS: Facial and Oral Movements Muscles of Facial Expression: None, normal Lips and Perioral Area: None, normal Jaw: None, normal Tongue: None, normal,Extremity Movements Upper (arms, wrists, hands, fingers): None, normal Lower (legs, knees, ankles, toes): None, normal, Trunk Movements Neck, shoulders, hips: None, normal, Overall Severity Severity of abnormal movements (highest score from questions above): None, normal Incapacitation due to abnormal movements: None, normal Patient's awareness of abnormal movements (rate only patient's report): No Awareness, Dental Status Current problems with teeth and/or dentures?: No Does patient usually wear dentures?: No  CIWA:  CIWA-Ar Total: 1 COWS:  COWS Total Score:  3  Musculoskeletal: Strength & Muscle Tone: within normal limits Gait & Station: normal Patient leans: N/A  Psychiatric Specialty Exam: Physical Exam  Nursing note and vitals reviewed. Psychiatric: He has a normal mood and affect. His speech is normal and behavior is normal. Judgment and thought content normal. Cognition and memory are normal.    Review of Systems  Psychiatric/Behavioral: Positive for depression ("improving") and substance abuse (Hx. polysubstance dependence). Negative for hallucinations, memory loss and suicidal ideas. The patient has insomnia ("Improving"). The patient is not nervous/anxious.     Blood pressure 131/69, pulse (!) 115, temperature 97.6 F (36.4 C), temperature source Oral, resp. rate 18, height 6\' 1"  (1.854 m), weight 81.6 kg (180 lb), SpO2 100 %.Body mass index is 23.75 kg/m.  General Appearance: Neat  Eye Contact:  Good  Speech:  Clear and Coherent  Volume:  Normal  Mood:  Anxious  Affect:  Appropriate  Thought Process:  Coherent  Orientation:  Full (Time, Place, and Person)  Thought Content:  Rumination  Suicidal Thoughts:  No  Homicidal Thoughts:  No  Memory:  Immediate;   Fair Recent;   Fair Remote;   Fair  Judgement:  Fair  Insight:  Fair  Psychomotor Activity:  Normal  Concentration:  Concentration: Fair and Attention Span: Fair  Recall:  [employer] of Knowledge:  Fair  Language:  Fair  Akathisia:  No  Handed:  Right  AIMS (if indicated):     Assets:  Desire for Improvement Resilience Social Support  ADL's:  Intact  Cognition:  WNL  Sleep:  Number of Hours: 6   Treatment Plan Summary: Review of chart, vital signs, medications, and notes.  1-Individual and group therapy  2-Medication management for depression and anxiety: Medications reviewed with the patient.    Will continue Librium Q6hrs for CIWA,  Will continue Clonidine protocols for opiate withdrawal.    Will Will continue Duloxetine DR 40 mg  depression.  3-Coping skills for depression, anxiety   4-Treatment plan in progress to prevent relapse of depression and anxiety.- Continue 15 minutes observation for safety concerns - Encouraged to participate in milieu therapy and group therapy counseling sessions and also work with coping skills -  Develop treatment plan to decrease risk of relapse upon discharge and to reduce the need for readmission. -  Psycho-social education regarding relapse prevention and self care. - Health care follow up as needed for medical problems. - Restart home medications where appropriate.  Sanjuana KavaNwoko, Agnes I, NP  PMHNP-BC 11/28/2016, 3:08 PMPatient ID: Raymond MelterMichael James Lemonds, male   DOB: 27-Oct-1956, 61 y.o.   MRN: [id number] Agree with NP Progress Note

## 2016-11-28 NOTE — Plan of Care (Signed)
Problem: Medication: Goal: Compliance with prescribed medication regimen will improve Outcome: Progressing Pt is taking meds as prescribed.    

## 2016-11-28 NOTE — Progress Notes (Deleted)
[facility] MD Progress Note  11/28/2016 1:34 PM Raymond Melton  MRN:  [id number] Subjective:  Patient states, "I got some good sleep and decided to clean up myself before I see anyone today."   Objective:   Patient feels stable on his protocol.  Will further work up with neuro consult c/o dysphagia and slurred speech.     Principal Problem: MDD (major depressive disorder), recurrent severe, without psychosis (HCC) Diagnosis:   Patient Active Problem List   Diagnosis Date Noted  . Opioid use disorder, moderate, dependence (HCC) [F11.20] 11/26/2016  . History of neck surgery [Z98.890] 11/26/2016  . Hx of hepatitis C [Z86.19] 11/26/2016  . Hypertension [I10] 11/26/2016  . Diabetes (HCC) [E11.9] 11/26/2016  . MDD (major depressive disorder), recurrent severe, without psychosis (HCC) [F33.2] 11/25/2016   Total Time spent with patient: 30 minutes  Past Psychiatric History: see HPI  Past Medical History:  Past Medical History:  Diagnosis Date  . Alcohol abuse   . Chronic pain   . Diabetes mellitus without complication (HCC)   . Hepatitis C   . Hypertension   . Immune deficiency disorder (HCC)   . Kidney stones   . TB lung, latent     Past Surgical History:  Procedure Laterality Date  . ABDOMINAL SURGERY    . ANKLE SURGERY    . CHOLECYSTECTOMY    . ELBOW SURGERY    . EYE SURGERY    . HERNIA REPAIR    . WRIST SURGERY     Family History:  Family History  Problem Relation Age of Onset  . Depression Mother   . Hypertension Mother    Family Psychiatric  History:  See HPI Social History:  History  Alcohol Use  . Yes    Comment: daily     History  Drug Use No    Social History   Social History  . Marital status: Married    Spouse name: N/A  . Number of children: N/A  . Years of education: N/A   Social History Main Topics  . Smoking status: Current Some Day Smoker    Packs/day: 0.50    Types: Cigarettes  . Smokeless tobacco: Never Used  . Alcohol use Yes   Comment: daily  . Drug use: No  . Sexual activity: Not Currently   Other Topics Concern  . None   Social History Narrative  . None   Additional Social History:    Pain Medications: Oxycodone  Prescriptions: see PTA Meds Over the Counter: see PTA meds History of alcohol / drug use?: Yes Name of Substance 1: Oxycodone 1 - Age of First Use: years 1 - Amount (size/oz): 10mg  1 - Frequency: 6-8 pills per day 1 - Duration: years 1 - Last Use / Amount: yesterday                  Sleep: Fair  Appetite:  Fair  Current Medications: Current Facility-Administered Medications  Medication Dose Route Frequency Provider Last Rate Last Dose  . acetaminophen (TYLENOL) tablet 650 mg  650 mg Oral Q6H PRN Craige Cotta, MD      . alum & mag hydroxide-simeth (MAALOX/MYLANTA) 200-200-20 MG/5ML suspension 30 mL  30 mL Oral Q4H PRN Laveda Abbe, NP      . chlordiazePOXIDE (LIBRIUM) capsule 25 mg  25 mg Oral Q6H PRN Jomarie Longs, MD   25 mg at 11/28/16 0224  . cloNIDine (CATAPRES) tablet 0.1 mg  0.1 mg Oral BH-qamhs Fernando A Cobos,  MD   0.1 mg at 11/28/16 0826   Followed by  . [START ON 11/30/2016] cloNIDine (CATAPRES) tablet 0.1 mg  0.1 mg Oral QAC breakfast Rockey Situ Cobos, MD      . dicyclomine (BENTYL) tablet 20 mg  20 mg Oral Q6H PRN Craige Cotta, MD   20 mg at 11/26/16 0811  . DULoxetine (CYMBALTA) DR capsule 40 mg  40 mg Oral Daily Adonis Brook, NP   40 mg at 11/28/16 0825  . hydrOXYzine (ATARAX/VISTARIL) tablet 25 mg  25 mg Oral Q6H PRN Craige Cotta, MD   25 mg at 11/27/16 2137  . insulin aspart (novoLOG) injection 0-15 Units  0-15 Units Subcutaneous TID WC Jomarie Longs, MD   2 Units at 11/28/16 1205  . insulin aspart (novoLOG) injection 0-5 Units  0-5 Units Subcutaneous QHS Saramma Eappen, MD      . loperamide (IMODIUM) capsule 2-4 mg  2-4 mg Oral PRN Rockey Situ Cobos, MD      . magnesium hydroxide (MILK OF MAGNESIA) suspension 30 mL  30 mL Oral Daily PRN  Laveda Abbe, NP      . methocarbamol (ROBAXIN) tablet 500 mg  500 mg Oral Q8H PRN Craige Cotta, MD   500 mg at 11/28/16 1128  . naproxen (NAPROSYN) tablet 500 mg  500 mg Oral BID PRN Craige Cotta, MD   500 mg at 11/28/16 1128  . nicotine polacrilex (NICORETTE) gum 2 mg  2 mg Oral PRN Craige Cotta, MD      . ondansetron (ZOFRAN-ODT) disintegrating tablet 4 mg  4 mg Oral Q6H PRN Craige Cotta, MD   4 mg at 11/27/16 1211  . traZODone (DESYREL) tablet 50 mg  50 mg Oral QHS PRN Craige Cotta, MD   50 mg at 11/27/16 2137    Lab Results:  Results for orders placed or performed during the hospital encounter of 11/25/16 (from the past 48 hour(s))  Glucose, capillary     Status: Abnormal   Collection Time: 11/26/16  5:21 PM  Result Value Ref Range   Glucose-Capillary 123 (H) 65 - 99 mg/dL  Glucose, capillary     Status: Abnormal   Collection Time: 11/26/16  8:52 PM  Result Value Ref Range   Glucose-Capillary 110 (H) 65 - 99 mg/dL  Glucose, capillary     Status: Abnormal   Collection Time: 11/27/16  5:20 AM  Result Value Ref Range   Glucose-Capillary 138 (H) 65 - 99 mg/dL  Hemoglobin W0J     Status: Abnormal   Collection Time: 11/27/16  6:34 AM  Result Value Ref Range   Hgb A1c MFr Bld 6.0 (H) 4.8 - 5.6 %    Comment: (NOTE)         Pre-diabetes: 5.7 - 6.4         Diabetes: >6.4         Glycemic control for adults with diabetes: <7.0    Mean Plasma Glucose 126 mg/dL    Comment: (NOTE) Performed At: [facility] [address] [place], [place] [id number] Mila Homer MD [id number] Performed at [facility]   Glucose, capillary     Status: Abnormal   Collection Time: 11/27/16 12:04 PM  Result Value Ref Range   Glucose-Capillary 152 (H) 65 - 99 mg/dL   Comment 1 Document in Chart   Glucose, capillary     Status: Abnormal   Collection Time: 11/27/16  5:29 PM  Result Value  Ref Range   Glucose-Capillary 118 (H) 65 - 99 mg/dL    Comment 1 Notify RN   Glucose, capillary     Status: Abnormal   Collection Time: 11/27/16  9:19 PM  Result Value Ref Range   Glucose-Capillary 111 (H) 65 - 99 mg/dL  Glucose, capillary     Status: Abnormal   Collection Time: 11/28/16  6:02 AM  Result Value Ref Range   Glucose-Capillary 136 (H) 65 - 99 mg/dL  Glucose, capillary     Status: Abnormal   Collection Time: 11/28/16 11:57 AM  Result Value Ref Range   Glucose-Capillary 130 (H) 65 - 99 mg/dL    Blood Alcohol level:  Lab Results  Component Value Date   [facility]   ETH <11 01/13/2013    Metabolic Disorder Labs: Lab Results  Component Value Date   HGBA1C 6.0 (H) 11/27/2016   MPG 126 11/27/2016   No results found for: PROLACTIN No results found for: CHOL, TRIG, HDL, CHOLHDL, VLDL, LDLCALC  Physical Findings: AIMS: Facial and Oral Movements Muscles of Facial Expression: None, normal Lips and Perioral Area: None, normal Jaw: None, normal Tongue: None, normal,Extremity Movements Upper (arms, wrists, hands, fingers): None, normal Lower (legs, knees, ankles, toes): None, normal, Trunk Movements Neck, shoulders, hips: None, normal, Overall Severity Severity of abnormal movements (highest score from questions above): None, normal Incapacitation due to abnormal movements: None, normal Patient's awareness of abnormal movements (rate only patient's report): No Awareness, Dental Status Current problems with teeth and/or dentures?: No Does patient usually wear dentures?: No  CIWA:  CIWA-Ar Total: 1 COWS:  COWS Total Score: 3  Musculoskeletal: Strength & Muscle Tone: within normal limits Gait & Station: normal Patient leans: N/A  Psychiatric Specialty Exam: Physical Exam  Nursing note and vitals reviewed. Psychiatric: He has a normal mood and affect. His speech is normal and behavior is normal. Judgment and thought content normal. Cognition and memory are normal.    Review of Systems  Neurological: Positive  for sensory change and speech change.  All other systems reviewed and are negative.   Blood pressure 131/69, pulse (!) 115, temperature 97.6 F (36.4 C), temperature source Oral, resp. rate 18, height 6\' 1"  (1.854 m), weight 81.6 kg (180 lb), SpO2 100 %.Body mass index is 23.75 kg/m.  General Appearance: Neat  Eye Contact:  Good  Speech:  Clear and Coherent  Volume:  Normal  Mood:  Anxious  Affect:  Appropriate  Thought Process:  Coherent  Orientation:  Full (Time, Place, and Person)  Thought Content:  Rumination  Suicidal Thoughts:  No  Homicidal Thoughts:  No  Memory:  Immediate;   Fair Recent;   Fair Remote;   Fair  Judgement:  Fair  Insight:  Fair  Psychomotor Activity:  Normal  Concentration:  Concentration: Fair and Attention Span: Fair  Recall:  [employer]  Fund of Knowledge:  Fair  Language:  Fair  Akathisia:  No  Handed:  Right  AIMS (if indicated):     Assets:  Desire for Improvement Resilience Social Support  ADL's:  Intact  Cognition:  WNL  Sleep:  Number of Hours: 6   Treatment Plan Summary: Review of chart, vital signs, medications, and notes.  1-Individual and group therapy  2-Medication management for depression and anxiety: Medications reviewed with the patient.  Librium Q6hrs CIWA withdrawal, Clonidine opiate withdrawal.  Duloxetine DR increased to 40 mg MDD.  Ordered head MRI for neuro c/o as stated above. 3-Coping skills for depression,  anxiety  4-Continue crisis stabilization and management  5-Address health issues--monitoring vital signs, stable  6-Treatment plan in progress to prevent relapse of depression and anxiety  Lindwood Qua, NP  [facility] 11/28/2016, 1:34 PM

## 2016-11-28 NOTE — Progress Notes (Signed)
   DATA ACTION RESPONSE  Objective- Pt. is up and visible in the room, seen awake in bed. Pt. presents with a depressed/anxious affect and mood. Pt. appears to be withdrawn/isolative from the milieu. Pt. appears to be restless. Subjective- Denies having any SI/HI/AVH at this time. Rates pain 6/10, lower back. Pt. states " I feel the same". Pt. continues to be cooperative and remain safe & pleasant on the unit. 1:1 interaction in private to establish rapport. Encouragement, education, & support given from staff. Meds. ordered and administered. PRN Robaxin & Librium requested and will re-eval accordingly. BS 101 @ bedtime. No coverage needed. MRI completed.   Safety maintained with Q 15 checks. Continues to follow treatment plan and will monitor closely. No additonal questions/concerns noted.

## 2016-11-29 LAB — GLUCOSE, CAPILLARY
GLUCOSE-CAPILLARY: 108 mg/dL — AB (ref 65–99)
GLUCOSE-CAPILLARY: 145 mg/dL — AB (ref 65–99)
GLUCOSE-CAPILLARY: 106 mg/dL — AB (ref 65–99)
Glucose-Capillary: 130 mg/dL — ABNORMAL HIGH (ref 65–99)

## 2016-11-29 MED ORDER — GABAPENTIN 100 MG PO CAPS
100.0000 mg | ORAL_CAPSULE | Freq: Three times a day (TID) | ORAL | Status: DC
  Administered 2016-11-29 – 2016-11-30 (×4): 100 mg via ORAL
  Filled 2016-11-29 (×10): qty 1

## 2016-11-29 NOTE — Progress Notes (Signed)
D: Patient denies SI/HI and A/V hallucinations; patient reports that he did not sleep well; Dr. Jama Flavorsobos made aware; patient complains of pain  A: Monitored q 15 minutes; patient encouraged to attend groups; patient educated about medications; patient given medications per physician orders; patient encouraged to express feelings and/or concerns  R: Patient is cooperative and pleasant; patient is appropriate to circumstances; patient is bright and animated;  patient's interaction with staff and peers is appropriate; patient was able to set goal to talk with staff 1:1 when having feelings of SI; patient is taking medications as prescribed and tolerating medications; patient is attending some groups

## 2016-11-29 NOTE — [facility] Group Notes (Signed)
[facility] LCSW Group Therapy  11/29/2016 4:18 PM  Type of Therapy:  Group Therapy  Participation Level:  Active  Participation Quality:  Attentive  Affect:  Appropriate  Cognitive:  Alert and Oriented  Insight:  Engaged  Engagement in Therapy:  Improving  Modes of Intervention:  Discussion, Education, Exploration, Problem-solving, Socialization and Support  Summary of Progress/Problems: Feelings around Relapse. Group members discussed the meaning of relapse and shared personal stories of relapse, how it affected them and others, and how they perceived themselves during this time. Group members were encouraged to identify triggers, warning signs and coping skills used when facing the possibility of relapse. Social supports were discussed and explored in detail. Post Acute Withdrawal Syndrome (handout provided) was introduced and examined. Pt's were encouraged to ask questions, talk about key points associated with PAWS, and process this information in terms of relapse prevention.   Saburo Luger N Smart LCSW 11/29/2016, 4:18 PM

## 2016-11-29 NOTE — Progress Notes (Signed)
[facility] MD Progress Note  11/29/2016 1:08 PM Raymond Melton  MRN:  [id number]  Subjective: Patient reports overall improvement , but continues to express some anxiety, related mostly to disposition planning. Remains interested and motivated in going to an inpatient rehab program but ruminates about money required for program  and about his wife having access to the safe box where he has the money. Patient reports he has been told he is " slurring " his words over recent days to weeks, but he himself is less aware of this. He states it started occurring after his neck surgery in December. Denies weakness or associated neurological symptoms.Does describe some chronic pain affecting shoulders, neck. Denies medication side effects.   Objective:   I have discussed case with treatment team and have met with patient. Presents partially improved- better groomed, improved range of affect. As above, currently ruminative about issues having to do with cost of going to Rehab progam and about how his wife will access and utilize  the money which he states is currently  in a safety box . Responds partially to reassurance and support from Probation officer and [place]- patient requesting that CSW contact wife to help clarify and arrange for above . Patient does present dysarthric, but today improved compared to yesterday and clearly understandable. He reported some dysphagia after surgery,now improved. Has had no difficulties with PO intake of solids or liquids recently. As recommended by Neurology consult , Head MRI done- no acute findings- some age related atrophy. On inspection no tongue atrophy, no fasciculations, swallow /gag reflex appear present , intact. Visible on unit, going to some groups, no disruptive or agitated behaviors. Tends to present with some anxiety, seeking reassurance from staff often. Denies medication side effects.    Principal Problem: MDD (major depressive disorder), recurrent severe, without  psychosis ([place])  Diagnosis:   Patient Active Problem List   Diagnosis Date Noted  . Opioid use disorder, moderate, dependence ([place]) [F11.20] 11/26/2016  . History of neck surgery [Z98.890] 11/26/2016  . Hx of hepatitis C [Z86.19] 11/26/2016  . Hypertension [I10] 11/26/2016  . Diabetes ([place]) [E11.9] 11/26/2016  . MDD (major depressive disorder), recurrent severe, without psychosis ([place]) [F33.2] 11/25/2016   Total Time spent with patient:  25 minutes   Past Psychiatric History: See HPI  Past Medical History:  Past Medical History:  Diagnosis Date  . Alcohol abuse   . Chronic pain   . Diabetes mellitus without complication ([place])   . Hepatitis C   . Hypertension   . Immune deficiency disorder ([place])   . Kidney stones   . TB lung, latent     Past Surgical History:  Procedure Laterality Date  . ABDOMINAL SURGERY    . ANKLE SURGERY    . CHOLECYSTECTOMY    . ELBOW SURGERY    . EYE SURGERY    . HERNIA REPAIR    . WRIST SURGERY     Family History:  Family History  Problem Relation Age of Onset  . Depression Mother   . Hypertension Mother    Family Psychiatric  History:  See HPI  Social History:  History  Alcohol Use  . Yes    Comment: daily     History  Drug Use No    Social History   Social History  . Marital status: Married    Spouse name: N/A  . Number of children: N/A  . Years of education: N/A   Social History Main Topics  . Smoking status:  Current Some Day Smoker    Packs/day: 0.50    Types: Cigarettes  . Smokeless tobacco: Never Used  . Alcohol use Yes     Comment: daily  . Drug use: No  . Sexual activity: Not Currently   Other Topics Concern  . None   Social History Narrative  . None   Additional Social History:    Pain Medications: Oxycodone  Prescriptions: see PTA Meds Over the Counter: see PTA meds History of alcohol / drug use?: Yes Name of Substance 1: Oxycodone 1 - Age of First Use: years 1 - Amount (size/oz): '10mg'$  1 -  Frequency: 6-8 pills per day 1 - Duration: years 1 - Last Use / Amount: yesterday  Sleep: improved   Appetite:  Improved   Current Medications: Current Facility-Administered Medications  Medication Dose Route Frequency Provider Last Rate Last Dose  . acetaminophen (TYLENOL) tablet 650 mg  650 mg Oral Q6H PRN Jenne Campus, MD   650 mg at 11/28/16 1557  . alum & mag hydroxide-simeth (MAALOX/MYLANTA) 200-200-20 MG/5ML suspension 30 mL  30 mL Oral Q4H PRN Ethelene Hal, NP      . chlordiazePOXIDE (LIBRIUM) capsule 25 mg  25 mg Oral Q6H PRN Ursula Alert, MD   25 mg at 11/28/16 1933  . cloNIDine (CATAPRES) tablet 0.1 mg  0.1 mg Oral BH-qamhs Myer Peer Cobos, MD   0.1 mg at 11/29/16 0859   Followed by  . [START ON 11/30/2016] cloNIDine (CATAPRES) tablet 0.1 mg  0.1 mg Oral QAC breakfast Myer Peer Cobos, MD      . dicyclomine (BENTYL) tablet 20 mg  20 mg Oral Q6H PRN Jenne Campus, MD   20 mg at 11/26/16 0811  . DULoxetine (CYMBALTA) DR capsule 40 mg  40 mg Oral Daily Kerrie Buffalo, NP   40 mg at 11/29/16 0859  . hydrOXYzine (ATARAX/VISTARIL) tablet 25 mg  25 mg Oral Q6H PRN Jenne Campus, MD   25 mg at 11/29/16 0143  . insulin aspart (novoLOG) injection 0-15 Units  0-15 Units Subcutaneous TID WC Ursula Alert, MD   2 Units at 11/29/16 1212  . insulin aspart (novoLOG) injection 0-5 Units  0-5 Units Subcutaneous QHS Saramma Eappen, MD      . loperamide (IMODIUM) capsule 2-4 mg  2-4 mg Oral PRN Myer Peer Cobos, MD      . magnesium hydroxide (MILK OF MAGNESIA) suspension 30 mL  30 mL Oral Daily PRN Ethelene Hal, NP      . methocarbamol (ROBAXIN) tablet 500 mg  500 mg Oral Q8H PRN Jenne Campus, MD   500 mg at 11/29/16 0645  . naproxen (NAPROSYN) tablet 500 mg  500 mg Oral BID PRN Jenne Campus, MD   500 mg at 11/29/16 0645  . nicotine polacrilex (NICORETTE) gum 2 mg  2 mg Oral PRN Jenne Campus, MD      . ondansetron (ZOFRAN-ODT) disintegrating tablet 4 mg  4 mg  Oral Q6H PRN Jenne Campus, MD   4 mg at 11/27/16 1211  . traZODone (DESYREL) tablet 100 mg  100 mg Oral QHS Encarnacion Slates, NP   100 mg at 11/28/16 2158   Lab Results:  Results for orders placed or performed during the hospital encounter of 11/25/16 (from the past 48 hour(s))  Glucose, capillary     Status: Abnormal   Collection Time: 11/27/16  5:29 PM  Result Value Ref Range   Glucose-Capillary 118 (H) 65 -  99 mg/dL   Comment 1 Notify RN   Glucose, capillary     Status: Abnormal   Collection Time: 11/27/16  9:19 PM  Result Value Ref Range   Glucose-Capillary 111 (H) 65 - 99 mg/dL  Glucose, capillary     Status: Abnormal   Collection Time: 11/28/16  6:02 AM  Result Value Ref Range   Glucose-Capillary 136 (H) 65 - 99 mg/dL  Glucose, capillary     Status: Abnormal   Collection Time: 11/28/16 11:57 AM  Result Value Ref Range   Glucose-Capillary 130 (H) 65 - 99 mg/dL  Glucose, capillary     Status: Abnormal   Collection Time: 11/28/16  5:25 PM  Result Value Ref Range   Glucose-Capillary 166 (H) 65 - 99 mg/dL  Glucose, capillary     Status: Abnormal   Collection Time: 11/28/16  9:57 PM  Result Value Ref Range   Glucose-Capillary 101 (H) 65 - 99 mg/dL  Glucose, capillary     Status: Abnormal   Collection Time: 11/29/16  6:30 AM  Result Value Ref Range   Glucose-Capillary 108 (H) 65 - 99 mg/dL  Glucose, capillary     Status: Abnormal   Collection Time: 11/29/16 11:47 AM  Result Value Ref Range   Glucose-Capillary 145 (H) 65 - 99 mg/dL    Blood Alcohol level:  Lab Results  Component Value Date   [facility] <11 02/15/2013   ETH <11 11/94/1740    Metabolic Disorder Labs: Lab Results  Component Value Date   HGBA1C 6.0 (H) 11/27/2016   MPG 126 11/27/2016   No results found for: PROLACTIN No results found for: CHOL, TRIG, HDL, CHOLHDL, VLDL, LDLCALC  Physical Findings: AIMS: Facial and Oral Movements Muscles of Facial Expression: None, normal Lips and Perioral Area: None,  normal Jaw: None, normal Tongue: None, normal,Extremity Movements Upper (arms, wrists, hands, fingers): None, normal Lower (legs, knees, ankles, toes): None, normal, Trunk Movements Neck, shoulders, hips: None, normal, Overall Severity Severity of abnormal movements (highest score from questions above): None, normal Incapacitation due to abnormal movements: None, normal Patient's awareness of abnormal movements (rate only patient's report): No Awareness, Dental Status Current problems with teeth and/or dentures?: No Does patient usually wear dentures?: No  CIWA:  CIWA-Ar Total: 0 COWS:  COWS Total Score: 0  Musculoskeletal: Strength & Muscle Tone: within normal limits Gait & Station: normal Patient leans: N/A  Psychiatric Specialty Exam: Physical Exam  Nursing note and vitals reviewed. Psychiatric: He has a normal mood and affect. His speech is normal and behavior is normal. Judgment and thought content normal. Cognition and memory are normal.    Review of Systems  Psychiatric/Behavioral: Positive for depression ("improving") and substance abuse (Hx. polysubstance dependence). Negative for hallucinations, memory loss and suicidal ideas. The patient has insomnia ("Improving"). The patient is not nervous/anxious.     Blood pressure 130/73, pulse 60, temperature 97.7 F (36.5 C), temperature source Oral, resp. rate 16, height '6\' 1"'$  (1.854 m), weight 81.6 kg (180 lb), SpO2 100 %.Body mass index is 23.75 kg/m.  General Appearance: improved grooming   Eye Contact:  Good  Speech:  Mild dysarthria, seems improved today  Volume:  Normal  Mood:  Denies significant depression,  Anxious  Affect:  Remains anxious, reactive affect, smiles at times appropriately   Thought Process:  Linear  Orientation:  Full (Time, Place, and Person)  Thought Content:  Rumination about disposition issues as above, denies hallucinations, no delusions expressed   Suicidal Thoughts:  No denies  suicidal or self  injurious ideations and contracts for safety on unit  Homicidal Thoughts:  No denies any homicidal or violent ideations  Memory:  Recent and remote grossly intact   Judgement:  Improving   Insight: improving   Psychomotor Activity:  Normal  Concentration:  Concentration: Good and Attention Span: Good  Recall:  Good  Fund of Knowledge:  Good  Language:  Good  Akathisia:  No  Handed:  Right  AIMS (if indicated):     Assets:  Desire for Improvement Resilience Social Support  ADL's:  Intact  Cognition:  WNL  Sleep:  Number of Hours: 6    Assessment - patient presents with partial improvement- at this time denies severe depression and affect is reactive. Does present anxious, and tends to ruminate , worry about marital and financial stressors. Patient had recent neck/cervical spine  surgery in December, states that after that he has been told by several people he is slurring his words- initially also had some dysphagia, but this resolved. As reported and per presentation, dysarthria seems to be improving slowly- no associated symptoms reported and Head MRI was negative for acute changes or pathology.  Treatment Plan Summary: Review of chart, vital signs, medications, and notes.  1-Individual and group therapy  2-Medication management for depression and anxiety: Medications reviewed with the patient.    Completing Clonidine  detox protocol  for opiate withdrawal.    Continue  Cymbalta 40 mg  QDAY for depression/anxiety  Start Neurontin 100 mgrs TID for anxiety and pain- side effects discussed   Continue Vistaril 25 mgrs Q 6 hours PRN for anxiety as needed   Disposition planning in progress, patient planning on going to Rehab on discharge [facility].  Have recommended that patient follow up with an outpatient Neurologist after discharge in order to monitor and manage report of dysarthria, as above . Patient expresses understanding.  Neita Garnet, MD  11/29/2016, 1:08 PM    Patient ID: Raymond Melton, male   DOB: 1956/07/09, 61 y.o.   MRN: [id number]

## 2016-11-29 NOTE — Progress Notes (Signed)
Updated progress note and MRI results faxed to [facility] ATTN: Naomi in admissions this morning per her request.  Trula SladeHeather Smart, MSW, LCSW Clinical Social Worker 11/29/2016 8:25 AM

## 2016-11-29 NOTE — Tx Team (Signed)
Interdisciplinary Treatment and Diagnostic Plan Update  11/29/2016 Time of Session: 9:30AM Raymond Melton MRN: [id number]  Principal Diagnosis: MDD (major depressive disorder), recurrent severe, without psychosis ([place])  Secondary Diagnoses: Principal Problem:   MDD (major depressive disorder), recurrent severe, without psychosis ([place]) Active Problems:   Opioid use disorder, moderate, dependence ([place])   History of neck surgery   Hx of hepatitis C   Hypertension   Diabetes ([place])   Current Medications:  Current Facility-Administered Medications  Medication Dose Route Frequency Provider Last Rate Last Dose  . acetaminophen (TYLENOL) tablet 650 mg  650 mg Oral Q6H PRN Jenne Campus, MD   650 mg at 11/28/16 1557  . alum & mag hydroxide-simeth (MAALOX/MYLANTA) 200-200-20 MG/5ML suspension 30 mL  30 mL Oral Q4H PRN Ethelene Hal, NP      . chlordiazePOXIDE (LIBRIUM) capsule 25 mg  25 mg Oral Q6H PRN Ursula Alert, MD   25 mg at 11/28/16 1933  . cloNIDine (CATAPRES) tablet 0.1 mg  0.1 mg Oral BH-qamhs Myer Peer Cobos, MD   0.1 mg at 11/29/16 0859   Followed by  . [START ON 11/30/2016] cloNIDine (CATAPRES) tablet 0.1 mg  0.1 mg Oral QAC breakfast Myer Peer Cobos, MD      . dicyclomine (BENTYL) tablet 20 mg  20 mg Oral Q6H PRN Jenne Campus, MD   20 mg at 11/26/16 0811  . DULoxetine (CYMBALTA) DR capsule 40 mg  40 mg Oral Daily Kerrie Buffalo, NP   40 mg at 11/29/16 0859  . hydrOXYzine (ATARAX/VISTARIL) tablet 25 mg  25 mg Oral Q6H PRN Jenne Campus, MD   25 mg at 11/29/16 0143  . insulin aspart (novoLOG) injection 0-15 Units  0-15 Units Subcutaneous TID WC Ursula Alert, MD   3 Units at 11/28/16 1727  . insulin aspart (novoLOG) injection 0-5 Units  0-5 Units Subcutaneous QHS Saramma Eappen, MD      . loperamide (IMODIUM) capsule 2-4 mg  2-4 mg Oral PRN Myer Peer Cobos, MD      . magnesium hydroxide (MILK OF MAGNESIA) suspension 30 mL  30 mL Oral Daily PRN Ethelene Hal, NP      . methocarbamol (ROBAXIN) tablet 500 mg  500 mg Oral Q8H PRN Jenne Campus, MD   500 mg at 11/29/16 0645  . naproxen (NAPROSYN) tablet 500 mg  500 mg Oral BID PRN Jenne Campus, MD   500 mg at 11/29/16 0645  . nicotine polacrilex (NICORETTE) gum 2 mg  2 mg Oral PRN Jenne Campus, MD      . ondansetron (ZOFRAN-ODT) disintegrating tablet 4 mg  4 mg Oral Q6H PRN Jenne Campus, MD   4 mg at 11/27/16 1211  . traZODone (DESYREL) tablet 100 mg  100 mg Oral QHS Encarnacion Slates, NP   100 mg at 11/28/16 2158   PTA Medications: Prescriptions Prior to Admission  Medication Sig Dispense Refill Last Dose  . albuterol (PROAIR HFA) 108 (90 Base) MCG/ACT inhaler INHALE 1-2 PUFFS EVERY 6 HOURS AS NEEDED     . ALPRAZolam (XANAX) 1 MG tablet Take 1 mg by mouth at bedtime.   Past Week at Unknown time  . amLODipine (NORVASC) 5 MG tablet Take 5 mg by mouth daily.   Past Month at Unknown time  . cetirizine (ZYRTEC) 10 MG tablet Take 10 mg by mouth.     . cholestyramine light (PREVALITE) 4 g packet Take by mouth.     [name]  diphenhydrAMINE (BENADRYL) 25 mg capsule Take 100 mg by mouth at bedtime as needed.   Past Month at Unknown time  . dronabinol (MARINOL) 10 MG capsule Take 10 mg by mouth.     . fluorometholone (FML) 0.1 % ophthalmic suspension One drop right eye twice daily for one week, then once daily     . ibuprofen (ADVIL,MOTRIN) 800 MG tablet Take 800 mg by mouth every 8 (eight) hours as needed.   Past Week at Unknown time  . metoprolol succinate (TOPROL-XL) 100 MG 24 hr tablet Take 100 mg by mouth daily. Take with or immediately following a meal.   Past Month at Unknown time  . mirtazapine (REMERON) 15 MG tablet TAKE ONE TABLET BY MOUTH NIGHTLY     . oxyCODONE (OXY IR/ROXICODONE) 5 MG immediate release tablet Take 10-20 mg by mouth every 4 (four) hours as needed for severe pain.   Past Week at Unknown time  . oxyCODONE (OXYCONTIN) 20 mg 12 hr tablet TAKE ONE TABLET BY MOUTH EVERY TWELVE  HOURS FOR 30 DAYS     . Oxycodone HCl 10 MG TABS Take 10 mg by mouth.     . promethazine (PHENERGAN) 25 MG tablet TAKE 1 TABLET EVERY 6 HOURS AS NEEDED FOR NAUSEA.     [name] sitaGLIPtin-metformin (JANUMET) 50-1000 MG tablet Take 1 tablet by mouth 2 (two) times daily with a meal.   Past Month at Unknown time  . traZODone (DESYREL) 150 MG tablet Take 150 mg by mouth.     [name] amLODipine (NORVASC) 5 MG tablet Take by mouth.     [name] amoxicillin-clavulanate (AUGMENTIN) 875-125 MG tablet TAKE ONE TABLET BY MOUTH EVERY TWELVE HOURS FOR 10 DAYS  0   . azithromycin (ZITHROMAX) 250 MG tablet TAKE TWO TABLETS BY MOUTH ON DAY 1 THEN TAKE ONE TABLET DAILY ON DAYS 2 - 5  0   . BELSOMRA 20 MG TABS TK 1 T PO QHS  0   . buprenorphine (BUTRANS - DOSED MCG/HR) 20 MCG/HR PTWK patch Place onto the skin.     . cyclobenzaprine (FLEXERIL) 10 MG tablet TK 1 T PO Q 6 TO 8 H PRF SPASM  0   . cyclobenzaprine (FLEXERIL) 5 MG tablet TAKE 1 TO 2 TABLETS BY MOUTH THREE TIMES DAILY  0   . dicyclomine (BENTYL) 20 MG tablet Take 20 mg by mouth every 6 (six) hours.  0   . folic acid (FOLVITE) 1 MG tablet Take by mouth.     . hydrOXYzine (VISTARIL) 25 MG capsule TK 1 C PO Q 8 H PRF ITCHING  0   . ibuprofen (ADVIL,MOTRIN) 600 MG tablet Take 600 mg by mouth.     [name] lisinopril-hydrochlorothiazide (PRINZIDE,ZESTORETIC) 20-12.5 MG tablet TK 1 T PO QD  0   . LYRICA 100 MG capsule Take 100 mg by mouth 2 (two) times daily.  1   . metoprolol succinate (TOPROL-XL) 100 MG 24 hr tablet Take by mouth.     . ondansetron (ZOFRAN-ODT) 4 MG disintegrating tablet Take 4 mg by mouth every 6 (six) hours as needed.  0   . oxyCODONE (OXYCONTIN) 10 mg 12 hr tablet Take by mouth.     . oxyCODONE-acetaminophen (PERCOCET/ROXICET) 5-325 MG tablet TAKE ONE TABLET BY MOUTH EVERY EIGHT TO TWELVE HOURS AS NEEDED  0   . PANCRELIPASE, LIP-PROT-AMYL, PO Take by mouth.     [name] PREVNAR 13 SUSP injection ADM 0.5ML IM UTD  0   . promethazine (PHENERGAN) 25  MG tablet Take 25 mg  by mouth every 12 (twelve) hours.  0   . QUEtiapine Fumarate (SEROQUEL XR) 150 MG 24 hr tablet Take by mouth.     . temazepam (RESTORIL) 15 MG capsule TK 1 C PO QD HS PRN  0   . thiamine 100 MG tablet Take by mouth.     [name] tiZANidine (ZANAFLEX) 4 MG tablet Take 4 mg by mouth 3 (three) times daily as needed.  0   . triamcinolone cream (KENALOG) 0.5 % APP EXT AA BID  0   . Vitamins/Minerals TABS Take by mouth.     . ziprasidone (GEODON) 20 MG capsule Take by mouth.       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   Physician Treatment Plan for Primary Diagnosis: MDD (major depressive disorder), recurrent severe, without psychosis ([facility]) Long Term Goal(s): Improvement in symptoms so as ready for discharge Improvement in symptoms so as ready for discharge   Short Term Goals: Ability to identify changes in lifestyle to reduce recurrence of condition will improve Ability to verbalize feelings will improve Ability to disclose and discuss suicidal ideas Ability to demonstrate self-control will improve Ability to identify and develop effective coping behaviors will improve Ability to maintain clinical measurements within normal limits will improve Compliance with prescribed medications will improve Ability to identify triggers associated with substance abuse/mental health issues will improve Ability to identify changes in lifestyle to reduce recurrence of condition will improve Ability to verbalize feelings will improve Ability to disclose and discuss suicidal ideas Ability to demonstrate self-control will improve Ability to identify and develop effective coping behaviors will improve Ability to maintain clinical  measurements within normal limits will improve Compliance with prescribed medications will improve Ability to identify triggers associated with substance abuse/mental health issues will improve  Medication Management: Evaluate patient's response, side effects, and tolerance of medication regimen.  Therapeutic Interventions: 1 to 1 sessions, Unit Group sessions and Medication administration.  Evaluation of Outcomes: Met  Physician Treatment Plan for Secondary Diagnosis: Principal Problem:   MDD (major depressive disorder), recurrent severe, without psychosis ([facility]) Active Problems:   Opioid use disorder, moderate, dependence ([facility])   History of neck surgery   Hx of hepatitis C   Hypertension   Diabetes ([facility])  Long Term Goal(s): Improvement in symptoms so as ready for discharge Improvement in symptoms so as ready for discharge   Short Term Goals: Ability to identify changes in lifestyle to reduce recurrence of condition will improve Ability to verbalize feelings will improve Ability to disclose and discuss suicidal ideas Ability to demonstrate self-control will improve Ability to identify and develop effective coping behaviors will improve Ability to maintain clinical measurements within normal limits will improve Compliance with prescribed medications will improve Ability to identify triggers associated with substance abuse/mental health issues will improve Ability to identify changes in lifestyle to reduce recurrence of condition will improve Ability to verbalize feelings will improve Ability to disclose and discuss suicidal ideas Ability to demonstrate self-control will improve Ability to identify and develop effective coping behaviors will improve Ability to maintain clinical measurements within normal limits will improve Compliance with prescribed medications will improve Ability to identify triggers associated with substance abuse/mental health issues will improve      Medication Management: Evaluate patient's response, side effects,  and tolerance of medication regimen.  Therapeutic Interventions: 1 to 1 sessions, Unit Group sessions and Medication administration.  Evaluation of Outcomes: Met   RN Treatment Plan for Primary Diagnosis: MDD (major depressive disorder), recurrent severe, without psychosis ([place]) Long Term Goal(s): Knowledge of disease and therapeutic regimen to maintain health will improve  Short Term Goals: Ability to remain free from injury will improve, Ability to disclose and discuss suicidal ideas and Ability to identify and develop effective coping behaviors will improve  Medication Management: RN will administer medications as ordered by provider, will assess and evaluate patient's response and provide education to patient for prescribed medication. RN will report any adverse and/or side effects to prescribing provider.  Therapeutic Interventions: 1 on 1 counseling sessions, Psychoeducation, Medication administration, Evaluate responses to treatment, Monitor vital signs and CBGs as ordered, Perform/monitor CIWA, COWS, AIMS and Fall Risk screenings as ordered, Perform wound care treatments as ordered.  Evaluation of Outcomes: Met   LCSW Treatment Plan for Primary Diagnosis: MDD (major depressive disorder), recurrent severe, without psychosis ([place]) Long Term Goal(s): Safe transition to appropriate next level of care at discharge, Engage patient in therapeutic group addressing interpersonal concerns.  Short Term Goals: Engage patient in aftercare planning with referrals and resources, Facilitate patient progression through stages of change regarding substance use diagnoses and concerns and Identify triggers associated with mental health/substance abuse issues  Therapeutic Interventions: Assess for all discharge needs, 1 to 1 time with Social worker, Explore available resources and support systems, Assess for adequacy in community  support network, Educate family and significant other(s) on suicide prevention, Complete Psychosocial Assessment, Interpersonal group therapy.  Evaluation of Outcomes: Met  Progress in Treatment: Attending groups: Yes Participating in groups: Yes Taking medication as prescribed: Yes. Toleration medication: Yes. Family/Significant other contact made: SPE completed with pt's wife. Collateral information also provided by wife.  Patient understands diagnosis: Yes. Discussing patient identified problems/goals with staff: Yes. Medical problems stabilized or resolved: Yes. Denies suicidal/homicidal ideation: Yes. Issues/concerns per patient self-inventory: No. Other: n/a  New problem(s) identified: No, Describe:  n/a  New Short Term/Long Term Goal(s):  Discharge Plan or Barriers: Pt referred to [facility] and has been accepted for Monday. Transportation will pick him up at 1:00PM but pt will be discharged at [place] to wife who will take him to get money for treatment. He is to return to hospital lobby to await pickup. Pt, wife, and Delcie Roch in admission are aware of plan.   Reason for Continuation of Hospitalization:  Medication stabilization  Estimated Length of Stay: 3 days (pt discharging Monday to go to treatment at [facility]).   Attendees: Patient: 11/29/2016 10:13 AM  Physician: Dr. Shea Evans MD; Dr. Parke Poisson MD 11/29/2016 10:13 AM  Nursing: Elsie Saas RN 11/29/2016 10:13 AM  RN Care Manager: Lars Pinks CM 11/29/2016 10:13 AM  Social Worker: Maxie Better, LCSW; Peri Maris LCSW 11/29/2016 10:13 AM  Recreational Therapist:  11/29/2016 10:13 AM  Other: Lindell Spar NP 11/29/2016 10:13 AM  Other:  11/29/2016 10:13 AM  Other: 11/29/2016 10:13 AM    Scribe for Treatment Team: Kimber Relic Smart, LCSW 11/29/2016 10:13 AM

## 2016-11-29 NOTE — Progress Notes (Signed)
  [facility]  Will you be returning to the same living situation after discharge:  No. [facility] for Monday.  At discharge, do you have transportation home?: Yes,  pt is scheduled for 9:00AM discharge on Monday and will return to lobby for pickup from [facility] driver at promptly 1:61WR1:00PM. Do you have the ability to pay for your medications: Yes,  [employer] insurance  Release of information consent forms completed and submitted to medical records by CSW.  Patient to Follow up at: Follow-up Information    [facility] Follow up on 12/02/2016.   Why:  You have been accepted for admission on Monday 12/02/16. Please be waiting in lobby at 1:00PM for transport to facility. Thank you.  Contact information: [address] [place], [place] [id number] Phone: [phone] Fax: [phone]          Next level of care provider [facility] Link:no  Safety Planning and Suicide Prevention discussed: Yes,  SPE completed with pt's wife and with pt.   Have you used any form of tobacco in the last 30 days? (Cigarettes, Smokeless Tobacco, Cigars, and/or Pipes): Yes  Has patient been referred to the Quitline?: Patient refused referral  Patient has been referred for addiction treatment: Yes  Raymond Melton N Smart LCSW 11/29/2016, 4:14 PM

## 2016-11-29 NOTE — Progress Notes (Signed)
Recreation Therapy Notes  Date: 11/29/16 Time: 0930 Location: [address] Dayroom  Group Topic: Stress Management  Goal Area(s) Addresses:  Patient will verbalize importance of using healthy stress management.  Patient will identify positive emotions associated with healthy stress management.   Intervention:  Stress Management  Activity :  [facility].  LRT introduced the stress management technique of guided imagery to patients.  LRT read a script to guide patients through the technique so they could engage in the process.  Patients were to follow along as LRT read script.  Education:  Stress Management, Discharge Planning.   Education Outcome: Acknowledges edcuation/In group clarification offered/Needs additional education  Clinical Observations/Feedback:  Pt did not attend group.     Caroll RancherMarjette Lasheka Kempner, LRT/CTRS         Caroll RancherLindsay, Racheal Mathurin A 11/29/2016 11:32 AM

## 2016-11-30 LAB — GLUCOSE, CAPILLARY
GLUCOSE-CAPILLARY: 98 mg/dL (ref 65–99)
Glucose-Capillary: 129 mg/dL — ABNORMAL HIGH (ref 65–99)
Glucose-Capillary: 139 mg/dL — ABNORMAL HIGH (ref 65–99)
Glucose-Capillary: 106 mg/dL — ABNORMAL HIGH (ref 65–99)

## 2016-11-30 MED ORDER — GABAPENTIN 100 MG PO CAPS
200.0000 mg | ORAL_CAPSULE | Freq: Three times a day (TID) | ORAL | Status: DC
  Administered 2016-11-30 – 2016-12-02 (×7): 200 mg via ORAL
  Filled 2016-11-30 (×12): qty 2

## 2016-11-30 NOTE — [facility] Group Notes (Signed)
Nurse PsychoEducational Group and Goals review: Patient attended and actively participated, stating that heis working on progressing out of addiction.

## 2016-11-30 NOTE — Progress Notes (Signed)
[facility] MD Progress Note  11/30/2016 4:09 PM Coleby Yett  MRN:  [id number]  Subjective: Patient reports overall improvement.  However, continue to report some apprehension at bedtime. He continues to experience some body aches & abdominal cramps, otherwise, his mood is improving well".  Objective:   I have discussed case with treatment team and have met with patient. Presents partially improved- better groomed, improved range of affect. As above, currently ruminative about issues having to do with cost of going to Rehab progam and about how his wife will access and utilize  the money which he states is currently  in a safety box . Responds partially to reassurance and support from Probation officer and [place]- patient requesting that CSW contact wife to help clarify and arrange for above . Patient does present dysarthric, but today improved compared to yesterday and clearly understandable. He reported some dysphagia after surgery,now improved. Has had no difficulties with PO intake of solids or liquids recently. As recommended by Neurology consult , Head MRI done- no acute findings- some age related atrophy. On inspection no tongue atrophy, no fasciculations, swallow /gag reflex appear present , intact. Visible on unit, going to some groups, no disruptive or agitated behaviors. Tends to present with some anxiety, seeking reassurance from staff often. Denies medication side effects.   Principal Problem: MDD (major depressive disorder), recurrent severe, without psychosis ([place])  Diagnosis:   Patient Active Problem List   Diagnosis Date Noted  . Opioid use disorder, moderate, dependence ([place]) [F11.20] 11/26/2016  . History of neck surgery [Z98.890] 11/26/2016  . Hx of hepatitis C [Z86.19] 11/26/2016  . Hypertension [I10] 11/26/2016  . Diabetes ([place]) [E11.9] 11/26/2016  . MDD (major depressive disorder), recurrent severe, without psychosis ([place]) [F33.2] 11/25/2016   Total Time spent with patient:  25  minutes   Past Psychiatric History: See HPI  Past Medical History:  Past Medical History:  Diagnosis Date  . Alcohol abuse   . Chronic pain   . Diabetes mellitus without complication ([place])   . Hepatitis C   . Hypertension   . Immune deficiency disorder ([place])   . Kidney stones   . TB lung, latent     Past Surgical History:  Procedure Laterality Date  . ABDOMINAL SURGERY    . ANKLE SURGERY    . CHOLECYSTECTOMY    . ELBOW SURGERY    . EYE SURGERY    . HERNIA REPAIR    . WRIST SURGERY     Family History:  Family History  Problem Relation Age of Onset  . Depression Mother   . Hypertension Mother    Family Psychiatric  History:  See HPI  Social History:  History  Alcohol Use  . Yes    Comment: daily     History  Drug Use No    Social History   Social History  . Marital status: Married    Spouse name: N/A  . Number of children: N/A  . Years of education: N/A   Social History Main Topics  . Smoking status: Current Some Day Smoker    Packs/day: 0.50    Types: Cigarettes  . Smokeless tobacco: Never Used  . Alcohol use Yes     Comment: daily  . Drug use: No  . Sexual activity: Not Currently   Other Topics Concern  . None   Social History Narrative  . None   Additional Social History:    Pain Medications: Oxycodone  Prescriptions: see PTA Meds Over the Counter:  see PTA meds History of alcohol / drug use?: Yes Name of Substance 1: Oxycodone 1 - Age of First Use: years 1 - Amount (size/oz): 26m 1 - Frequency: 6-8 pills per day 1 - Duration: years 1 - Last Use / Amount: yesterday  Sleep: improved   Appetite:  Improved   Current Medications: Current Facility-Administered Medications  Medication Dose Route Frequency Provider Last Rate Last Dose  . acetaminophen (TYLENOL) tablet 650 mg  650 mg Oral Q6H PRN FJenne Campus MD   650 mg at 11/28/16 1557  . alum & mag hydroxide-simeth (MAALOX/MYLANTA) 200-200-20 MG/5ML suspension 30 mL  30 mL Oral  Q4H PRN LEthelene Hal NP      . chlordiazePOXIDE (LIBRIUM) capsule 25 mg  25 mg Oral Q6H PRN SUrsula Alert MD   25 mg at 11/28/16 1933  . cloNIDine (CATAPRES) tablet 0.1 mg  0.1 mg Oral QAC breakfast FJenne Campus MD   0.1 mg at 11/30/16 0859  . dicyclomine (BENTYL) tablet 20 mg  20 mg Oral Q6H PRN FJenne Campus MD   20 mg at 11/26/16 0811  . DULoxetine (CYMBALTA) DR capsule 40 mg  40 mg Oral Daily SKerrie Buffalo NP   40 mg at 11/30/16 0858  . gabapentin (NEURONTIN) capsule 100 mg  100 mg Oral TID FJenne Campus MD   100 mg at 11/30/16 1208  . hydrOXYzine (ATARAX/VISTARIL) tablet 25 mg  25 mg Oral Q6H PRN FJenne Campus MD   25 mg at 11/30/16 0440  . insulin aspart (novoLOG) injection 0-15 Units  0-15 Units Subcutaneous TID WC SUrsula Alert MD   2 Units at 11/30/16 1210  . insulin aspart (novoLOG) injection 0-5 Units  0-5 Units Subcutaneous QHS Saramma Eappen, MD      . loperamide (IMODIUM) capsule 2-4 mg  2-4 mg Oral PRN FMyer PeerCobos, MD      . magnesium hydroxide (MILK OF MAGNESIA) suspension 30 mL  30 mL Oral Daily PRN LEthelene Hal NP      . methocarbamol (ROBAXIN) tablet 500 mg  500 mg Oral Q8H PRN FJenne Campus MD   500 mg at 11/30/16 0901  . naproxen (NAPROSYN) tablet 500 mg  500 mg Oral BID PRN FJenne Campus MD   500 mg at 11/30/16 0901  . nicotine polacrilex (NICORETTE) gum 2 mg  2 mg Oral PRN FJenne Campus MD      . ondansetron (ZOFRAN-ODT) disintegrating tablet 4 mg  4 mg Oral Q6H PRN FJenne Campus MD   4 mg at 11/27/16 1211  . traZODone (DESYREL) tablet 100 mg  100 mg Oral QHS AEncarnacion Slates NP   100 mg at 11/29/16 2138   Lab Results:  Results for orders placed or performed during the hospital encounter of 11/25/16 (from the past 48 hour(s))  Glucose, capillary     Status: Abnormal   Collection Time: 11/28/16  5:25 PM  Result Value Ref Range   Glucose-Capillary 166 (H) 65 - 99 mg/dL  Glucose, capillary     Status: Abnormal    Collection Time: 11/28/16  9:57 PM  Result Value Ref Range   Glucose-Capillary 101 (H) 65 - 99 mg/dL  Glucose, capillary     Status: Abnormal   Collection Time: 11/29/16  6:30 AM  Result Value Ref Range   Glucose-Capillary 108 (H) 65 - 99 mg/dL  Glucose, capillary     Status: Abnormal   Collection Time: 11/29/16 11:47 AM  Result Value Ref Range   Glucose-Capillary 145 (H) 65 - 99 mg/dL  Glucose, capillary     Status: Abnormal   Collection Time: 11/29/16  5:10 PM  Result Value Ref Range   Glucose-Capillary 106 (H) 65 - 99 mg/dL  Glucose, capillary     Status: Abnormal   Collection Time: 11/29/16  9:17 PM  Result Value Ref Range   Glucose-Capillary 130 (H) 65 - 99 mg/dL  Glucose, capillary     Status: Abnormal   Collection Time: 11/30/16  5:40 AM  Result Value Ref Range   Glucose-Capillary 129 (H) 65 - 99 mg/dL  Glucose, capillary     Status: Abnormal   Collection Time: 11/30/16 12:03 PM  Result Value Ref Range   Glucose-Capillary 139 (H) 65 - 99 mg/dL   Comment 1 Notify RN     Blood Alcohol level:  Lab Results  Component Value Date   [facility] <11 02/15/2013   ETH <11 16/60/6004    Metabolic Disorder Labs: Lab Results  Component Value Date   HGBA1C 6.0 (H) 11/27/2016   MPG 126 11/27/2016   No results found for: PROLACTIN No results found for: CHOL, TRIG, HDL, CHOLHDL, VLDL, LDLCALC  Physical Findings: AIMS: Facial and Oral Movements Muscles of Facial Expression: None, normal Lips and Perioral Area: None, normal Jaw: None, normal Tongue: None, normal,Extremity Movements Upper (arms, wrists, hands, fingers): None, normal Lower (legs, knees, ankles, toes): None, normal, Trunk Movements Neck, shoulders, hips: None, normal, Overall Severity Severity of abnormal movements (highest score from questions above): None, normal Incapacitation due to abnormal movements: None, normal Patient's awareness of abnormal movements (rate only patient's report): No Awareness, Dental  Status Current problems with teeth and/or dentures?: No Does patient usually wear dentures?: No  CIWA:  CIWA-Ar Total: 2 COWS:  COWS Total Score: 3  Musculoskeletal: Strength & Muscle Tone: within normal limits Gait & Station: normal Patient leans: N/A  Psychiatric Specialty Exam: Physical Exam  Nursing note and vitals reviewed. Psychiatric: He has a normal mood and affect. His speech is normal and behavior is normal. Judgment and thought content normal. Cognition and memory are normal.    Review of Systems  Psychiatric/Behavioral: Positive for depression ("improving") and substance abuse (Hx. polysubstance dependence). Negative for hallucinations, memory loss and suicidal ideas. The patient has insomnia ("Improving"). The patient is not nervous/anxious.     Blood pressure 115/80, pulse 88, temperature 98 F (36.7 C), resp. rate 20, height 6' 1"  (1.854 m), weight 81.6 kg (180 lb), SpO2 100 %.Body mass index is 23.75 kg/m.  General Appearance: improved grooming   Eye Contact:  Good  Speech:  Mild dysarthria, seems improved today  Volume:  Normal  Mood:  Denies significant depression,  Anxious  Affect:  Remains anxious, reactive affect, smiles at times appropriately   Thought Process:  Linear  Orientation:  Full (Time, Place, and Person)  Thought Content:  Rumination about disposition issues as above, denies hallucinations, no delusions expressed   Suicidal Thoughts:  No denies suicidal or self injurious ideations and contracts for safety on unit  Homicidal Thoughts:  No denies any homicidal or violent ideations  Memory:  Recent and remote grossly intact   Judgement:  Improving   Insight: improving   Psychomotor Activity:  Normal  Concentration:  Concentration: Good and Attention Span: Good  Recall:  Good  Fund of Knowledge:  Good  Language:  Good  Akathisia:  No  Handed:  Right  AIMS (if indicated):  Assets:  Desire for Improvement Resilience Social Support  ADL's:   Intact  Cognition:  WNL  Sleep:  Number of Hours: 5.25    Assessment - patient presents with partial improvement- at this time denies severe depression and affect is reactive. Does present anxious, and tends to ruminate , worry about marital and financial stressors. Patient had recent neck/cervical spine  surgery in December, states that after that he has been told by several people he is slurring his words- initially also had some dysphagia, but this resolved. As reported and per presentation, dysarthria seems to be improving slowly- no associated symptoms reported and Head MRI was negative for acute changes or pathology.  Treatment Plan Summary: Review of chart, vital signs, medications, and notes.  1-Individual and group therapy  2-Medication management for depression and anxiety: Medications reviewed with the patient.    Completing Clonidine  detox protocol  for opiate withdrawal.    Continue  Cymbalta 40 mg  QDAY for depression/anxiety  Increased Neurontin to 200 mgrs QID for anxiety and pain- side effects discussed   Continue Vistaril 25 mgrs Q 6 hours PRN for anxiety as needed   Disposition planning in progress, patient planning on going to Rehab on discharge [facility].  Have recommended that patient follow up with an outpatient Neurologist after discharge in order to monitor and manage report of dysarthria, as above . Patient expresses understanding.  Encarnacion Slates, NP, PMHNP, FNP-BC 11/30/2016, 4:09 PM    Agree with notes and plan.

## 2016-11-30 NOTE — Progress Notes (Signed)
The patient attended this evening's A. A. Meeting and was appropriate.  

## 2016-11-30 NOTE — Progress Notes (Signed)
D: Patient's self inventory sheet: patient has fair sleep, recieved sleep medication.fair  Appetite, normal energy level, good concentration. Rated depression 5/10, hopeless 4/10, anxiety 6/10. SI/HI/AVH: denies all. Physical complaints are neck pain. Patient has been present in the milieu, interacting with peers and staff. Anxious about going to rehab Monday. Eager to talk about the progression of his addiction.    A: Medications administered, assessed medication knowledge and education given on medication regimen.  Emotional support and encouragement given patient. R: Denies SI and HI , contracts for safety. Safety maintained with 15 minute checks.

## 2016-11-30 NOTE — [facility] Group Notes (Signed)
  [facility] LCSW Group Therapy Note  Date and  10:00 to 11:10 AM  Type of Therapy and Topic:  Group Therapy: Avoiding Self-Sabotaging and Enabling Behaviors  Participation Level:  Active  Participation Quality:  Attentive and Sharing  Affect:  Depressed  Cognitive:  Alert and Oriented  Insight:  Developing/Improving  Engagement in Therapy:  Developing/Improving   Therapeutic models used: Cognitive Behavioral Therapy,  Person-Centered Therapy and Motivational Interviewing  Modes of Intervention:  Discussion, Exploration, Orientation, Rapport Building, Socialization and Support   Summary of Progress/Problems:  The main focus of today's process group was for the patient to identify ways in which they have in the past sabotaged their own recovery.Patients discussed pros and cons of multiple self sabotaging methods including: Isolation, Negative Peer Group, Substance Use, Negative Self Talk, Rumination, Shame & Guilt and Noncompliance with Follow up.  Motivational Interviewing was utilized to identify motivation they may have for wanting to change. Patient engaged easily yet needed redirection to avoid tangents. [name] was able to list multiple cons for his ongoing opoid use yet unable to procvess what change might look like for him upon discharge.   [name] Fullam, LCSW

## 2016-11-30 NOTE — Progress Notes (Signed)
Patient ID: Raymond Melton, male   DOB: 1956-03-26, 61 y.o.   MRN: [id number]  Pt currently presents with a flat affect and anxious, restless behavior. Pt reports to writer that their goal is to "never take any pain medication the way I was again." Pt reports anxiety surrounding [place]. Still has concerns about his wife's needs, support systems and finances. Pt reports good sleep with current medication regimen.   Pt provided with medications per providers orders. Pt's labs and vitals were monitored throughout the night. Pt supported emotionally and encouraged to express concerns and questions. Pt educated on medications and assertiveness techniques.  Pt's safety ensured with 15 minute and environmental checks. Pt currently denies SI/HI and A/V hallucinations. Pt verbally agrees to seek staff if SI/HI or A/VH occurs and to consult with staff before acting on any harmful thoughts. Will continue POC.

## 2016-12-01 LAB — GLUCOSE, CAPILLARY
GLUCOSE-CAPILLARY: 122 mg/dL — AB (ref 65–99)
GLUCOSE-CAPILLARY: 98 mg/dL (ref 65–99)
Glucose-Capillary: 130 mg/dL — ABNORMAL HIGH (ref 65–99)
Glucose-Capillary: 108 mg/dL — ABNORMAL HIGH (ref 65–99)

## 2016-12-01 MED ORDER — PREGABALIN 100 MG PO CAPS: 100.0000 mg | ORAL_CAPSULE | Freq: Two times a day (BID) | ORAL | Status: DC

## 2016-12-01 MED ORDER — HYDROXYZINE HCL 25 MG PO TABS
25.0000 mg | ORAL_TABLET | Freq: Four times a day (QID) | ORAL | Status: DC | PRN
  Administered 2016-12-01 – 2016-12-02 (×2): 25 mg via ORAL
  Filled 2016-12-01 (×2): qty 1

## 2016-12-01 MED ORDER — HYDROXYZINE HCL 25 MG PO TABS: 25.0000 mg | ORAL_TABLET | Freq: Four times a day (QID) | ORAL | 0 refills | Status: DC | PRN

## 2016-12-01 MED ORDER — TRAZODONE HCL 100 MG PO TABS: 100.0000 mg | ORAL_TABLET | Freq: Every day | ORAL | 0 refills | Status: DC

## 2016-12-01 MED ORDER — IBUPROFEN 600 MG PO TABS: 600.0000 mg | ORAL_TABLET | Freq: Four times a day (QID) | ORAL | Status: DC | PRN

## 2016-12-01 MED ORDER — AMLODIPINE BESYLATE 5 MG PO TABS
5.0000 mg | ORAL_TABLET | Freq: Every day | ORAL | Status: DC
  Administered 2016-12-01 – 2016-12-02 (×2): 5 mg via ORAL
  Filled 2016-12-01 (×3): qty 1

## 2016-12-01 MED ORDER — LISINOPRIL 20 MG PO TABS
20.0000 mg | ORAL_TABLET | Freq: Every day | ORAL | Status: DC
  Administered 2016-12-01 – 2016-12-02 (×2): 20 mg via ORAL
  Filled 2016-12-01 (×3): qty 1

## 2016-12-01 MED ORDER — NICOTINE POLACRILEX 2 MG MT GUM: 2.0000 mg | CHEWING_GUM | OROMUCOSAL | 0 refills | Status: DC | PRN

## 2016-12-01 MED ORDER — LISINOPRIL-HYDROCHLOROTHIAZIDE 20-12.5 MG PO TABS: 1.0000 | ORAL_TABLET | Freq: Every day | ORAL | Status: DC

## 2016-12-01 MED ORDER — METOPROLOL SUCCINATE ER 100 MG PO TB24: 100.0000 mg | ORAL_TABLET | Freq: Every day | ORAL | Status: DC

## 2016-12-01 MED ORDER — GABAPENTIN 100 MG PO CAPS: 200.0000 mg | ORAL_CAPSULE | Freq: Three times a day (TID) | ORAL | 0 refills | Status: DC

## 2016-12-01 MED ORDER — DULOXETINE HCL 40 MG PO CPEP: 40.0000 mg | ORAL_CAPSULE | Freq: Every day | ORAL | 0 refills | Status: DC

## 2016-12-01 MED ORDER — HYDROCHLOROTHIAZIDE 12.5 MG PO CAPS
12.5000 mg | ORAL_CAPSULE | Freq: Every day | ORAL | Status: DC
  Administered 2016-12-01 – 2016-12-02 (×2): 12.5 mg via ORAL
  Filled 2016-12-01 (×3): qty 1

## 2016-12-01 MED ORDER — AMLODIPINE BESYLATE 5 MG PO TABS: 5.0000 mg | ORAL_TABLET | Freq: Every day | ORAL | Status: DC

## 2016-12-01 MED ORDER — METHOCARBAMOL 500 MG PO TABS
500.0000 mg | ORAL_TABLET | Freq: Once | ORAL | Status: AC
  Administered 2016-12-01: 500 mg via ORAL

## 2016-12-01 MED ORDER — ONDANSETRON 4 MG PO TBDP: 4.0000 mg | ORAL_TABLET | Freq: Four times a day (QID) | ORAL | 0 refills | Status: DC | PRN

## 2016-12-01 MED ORDER — ALBUTEROL SULFATE HFA 108 (90 BASE) MCG/ACT IN AERS: INHALATION_SPRAY | RESPIRATORY_TRACT | Status: DC

## 2016-12-01 MED ORDER — METOPROLOL SUCCINATE ER 100 MG PO TB24
100.0000 mg | ORAL_TABLET | Freq: Every day | ORAL | Status: DC
  Administered 2016-12-01 – 2016-12-02 (×2): 100 mg via ORAL
  Filled 2016-12-01 (×3): qty 1

## 2016-12-01 MED ORDER — NAPROXEN 500 MG PO TABS
500.0000 mg | ORAL_TABLET | Freq: Once | ORAL | Status: AC
  Administered 2016-12-01: 500 mg via ORAL

## 2016-12-01 NOTE — [facility] Group Notes (Signed)
[facility] Group Notes:  (Nursing/MHT/Case Management/Adjunct)  Date:  12/01/2016  Time:  4:30 PM  Type of Therapy:  Nurse Education  Participation Level:  Active  Participation Quality:  Appropriate and Attentive  Affect:  Flat  Cognitive:  Alert, Appropriate and Oriented  Insight:  Improving  Engagement in Group:  Developing/Improving and Engaged  Modes of Intervention:  Discussion and Education  Summary of Progress/Problems: Patient defined recovery as "trying to save my life". Shared that he feels his life depends on his recovery from drugs. Expressed discouragement that at his age he may never fully recover from the damage done.   Vinetta BergamoBarbara M Armelia Penton 12/01/2016, 4:30 PM

## 2016-12-01 NOTE — Progress Notes (Signed)
Patient present in the milieu, anxious about discharge. Selectively interactive with peers and talkative with staff. Patient is going to long term rehab and has some anxiety about that. New BP medications ordered and education done. Patient denies SI/HI/AVH.

## 2016-12-01 NOTE — [facility] Suicide Risk Assessment (Signed)
[facility]   Principal Problem: MDD (major depressive disorder), recurrent severe, without psychosis ([facility]) Discharge Diagnoses:  Patient Active Problem List   Diagnosis Date Noted  . Opioid use disorder, moderate, dependence ([facility]) [F11.20] 11/26/2016  . History of neck surgery [Z98.890] 11/26/2016  . Hx of hepatitis C [Z86.19] 11/26/2016  . Hypertension [I10] 11/26/2016  . Diabetes ([facility]) [E11.9] 11/26/2016  . MDD (major depressive disorder), recurrent severe, without psychosis ([facility]) [F33.2] 11/25/2016    Total Time spent with patient: 30 minutes  Musculoskeletal: Strength & Muscle Tone: within normal limits Gait & Station: normal Patient leans: no lean  Psychiatric Specialty Exam: Review of Systems  Cardiovascular: Negative for chest pain.  Gastrointestinal: Negative for nausea.  Psychiatric/Behavioral: Negative for suicidal ideas.    Blood pressure (!) 155/85, pulse 100, temperature 97.5 F (36.4 C), resp. rate 16, height 6\' 1"  (1.854 m), weight 81.6 kg (180 lb), SpO2 100 %.Body mass index is 23.75 kg/m.  General Appearance: Casual  Eye Contact::  Fair  Speech:  Normal Rate409  Volume:  Decreased  Mood:  Euthymic  Affect:  Congruent  Thought Process:  Goal Directed  Orientation:  Full (Time, Place, and Person)  Thought Content:  Rumination  Suicidal Thoughts:  No  Homicidal Thoughts:  No  Memory:  Immediate;   Fair Recent;   Fair  Judgement:  Fair  Insight:  Shallow  Psychomotor Activity:  Normal  Concentration:  Fair  Recall:  [employer]Fair  Fund of Knowledge:Fair  Language: Fair  Akathisia:  Negative  Handed:  Right  AIMS (if indicated):     Assets:  Financial Resources/Insurance  Sleep:  Number of Hours: 5.75  Cognition: WNL  ADL's:  Intact   Mental Status Per Nursing Assessment::   On Admission:  NA  Demographic Factors:  Male and Unemployed  Loss Factors: Financial problems/change in socioeconomic status  Historical  Factors: Impulsivity  Risk Reduction Factors:   Living with another person, especially a relative and Positive social support  Continued Clinical Symptoms:  Alcohol/Substance Abuse/Dependencies  Cognitive Features That Contribute To Risk:  None    Suicide Risk:  Minimal: No identifiable suicidal ideation.  Patients presenting with no risk factors but with morbid ruminations; may be classified as minimal risk based on the severity of the depressive symptoms  Follow-up Information    [facility] Follow up on 12/02/2016.   Why:  You have been accepted for admission on Monday 12/02/16. Please be waiting in lobby at 1:00PM for transport to facility. Thank you.  Contact information: [address] [place], [place] [id number] Phone: [phone] Fax: [phone]          Plan Of Care/Follow-up recommendations:  Activity:  as tolerated Diet:  regular Patient to [facility]. Discharge to be done early morning tomorrow 12/02/2016 Follow up with recommendations, compliance and treatments.  Thresa RossAKHTAR, Arnetha Silverthorne, MD 12/01/2016, 12:33 PM

## 2016-12-01 NOTE — Progress Notes (Signed)
D: Pt at the time of assessment was alert and oriented x4. Pt endorsed moderate anxiety and depression; stats, "I got a safe box that my wife and I will be going to see when I leave here; it has some drugs that my therapist wanted me to get rid of; my wife has to be there when I so. I got some money in there that I don't want her to see; everything is making me depressed and anxious at the same time."  Pt also complained of moderate to severe neck pain from past surgery. Pt however denies SI, HI HVH.  A: Medications offered as prescribed.  Support, encouragement, and safe environment provided.  15-minute safety checks continue. R: Pt was med compliant.  Pt attended group. Safety checks continue.

## 2016-12-01 NOTE — [facility] Group Notes (Signed)
[facility] LCSW Group Therapy  12/01/2016 10 - 10:45 AM  Type of Therapy:  Group Therapy  Participation Level:  Active  Participation Quality:  Sharing  Affect:  Defensive and Lethargic  Cognitive:  Oriented  Insight:  Distracting and Limited  Engagement in Therapy:  Limited  Modes of Intervention:  Activity, Discussion, Exploration, Socialization and Support  Summary of Progress/Problems: Topic for today was thoughts and feelings regarding discharge. We discussed fears of upcoming changes including judgements, expectations and stigma of mental health issues. We then discussed supports: what constitutes a supportive framework, identification of supports and what to do when others are not supportive. Pt engaged easily during group session. As patients processed their anxiety about discharge and described healthy supports patient was focused on concerns he has with tasks he needs to complete in wife's presence.   Patient chose a visual to represent decompensation as feeling stuck;inactive  and improvement as 'really being present for my wife"   Raymond [name], LCSW

## 2016-12-01 NOTE — Progress Notes (Signed)
The patient attended the evening A.A. Meeting and was appropriate.  

## 2016-12-01 NOTE — Progress Notes (Signed)
Patient ID: Alakai Macbride, male   DOB: August 26, 1956, 61 y.o.   MRN: [id number] [facility] MD Progress Note  12/01/2016 1:54 PM Javious Hallisey  MRN:  [id number]  Subjective: Delphin reports, "I had a bad night last night. I did not sleep at all. My room-mate snored all night long. I feel sluggish today as a result. I'm wondering if I can be moved to another room just to get some sleep tonight. However, I'm excited about getting discharged in the morning to face a new adventure at the treatment center".  Objective:   I have discussed case with treatment team and have met with patient. Presents improved- better groomed, improved range of affect. Responds to reassurance and support from Probation officer and [place]- patient requesting that CSW contact wife to help clarify and arrange for above. Complained of room mate snoring at night. Stoney will be provided with an ear plug tonight to help off set the snoring noise. Visible on unit, going to groups, no disruptive or agitated behaviors. Tends to present with some anxiety, seeking reassurance from staff often. Denies medication side effects.   Principal Problem: MDD (major depressive disorder), recurrent severe, without psychosis ([place])  Diagnosis:   Patient Active Problem List   Diagnosis Date Noted  . Opioid dependence with opioid-induced disorder ([place]) [F11.29] 11/26/2016  . History of neck surgery [Z98.890] 11/26/2016  . Hx of hepatitis C [Z86.19] 11/26/2016  . Hypertension [I10] 11/26/2016  . Diabetes ([place]) [E11.9] 11/26/2016  . MDD (major depressive disorder), recurrent severe, without psychosis ([place]) [F33.2] 11/25/2016   Total Time spent with patient:  25 minutes   Past Psychiatric History: See HPI  Past Medical History:  Past Medical History:  Diagnosis Date  . Alcohol abuse   . Chronic pain   . Diabetes mellitus without complication ([place])   . Hepatitis C   . Hypertension   . Immune deficiency disorder ([place])   . Kidney stones   . TB lung,  latent     Past Surgical History:  Procedure Laterality Date  . ABDOMINAL SURGERY    . ANKLE SURGERY    . CHOLECYSTECTOMY    . ELBOW SURGERY    . EYE SURGERY    . HERNIA REPAIR    . WRIST SURGERY     Family History:  Family History  Problem Relation Age of Onset  . Depression Mother   . Hypertension Mother    Family Psychiatric  History:  See HPI  Social History:  History  Alcohol Use  . Yes    Comment: daily     History  Drug Use No    Social History   Social History  . Marital status: Married    Spouse name: N/A  . Number of children: N/A  . Years of education: N/A   Social History Main Topics  . Smoking status: Current Some Day Smoker    Packs/day: 0.50    Types: Cigarettes  . Smokeless tobacco: Never Used  . Alcohol use Yes     Comment: daily  . Drug use: No  . Sexual activity: Not Currently   Other Topics Concern  . None   Social History Narrative  . None   Additional Social History:    Pain Medications: Oxycodone  Prescriptions: see PTA Meds Over the Counter: see PTA meds History of alcohol / drug use?: Yes Name of Substance 1: Oxycodone 1 - Age of First Use: years 1 - Amount (size/oz): 29m 1 - Frequency: 6-8 pills per  day 1 - Duration: years 1 - Last Use / Amount: yesterday  Sleep: improved   Appetite:  Improved   Current Medications: Current Facility-Administered Medications  Medication Dose Route Frequency Provider Last Rate Last Dose  . acetaminophen (TYLENOL) tablet 650 mg  650 mg Oral Q6H PRN Jenne Campus, MD   650 mg at 11/28/16 1557  . alum & mag hydroxide-simeth (MAALOX/MYLANTA) 200-200-20 MG/5ML suspension 30 mL  30 mL Oral Q4H PRN Ethelene Hal, NP      . amLODipine (NORVASC) tablet 5 mg  5 mg Oral Daily Encarnacion Slates, NP   5 mg at 12/01/16 1304  . chlordiazePOXIDE (LIBRIUM) capsule 25 mg  25 mg Oral Q6H PRN Ursula Alert, MD   25 mg at 12/01/16 0037  . DULoxetine (CYMBALTA) DR capsule 40 mg  40 mg Oral Daily  Kerrie Buffalo, NP   40 mg at 12/01/16 0851  . gabapentin (NEURONTIN) capsule 200 mg  200 mg Oral TID PC & HS Encarnacion Slates, NP   200 mg at 12/01/16 1303  . lisinopril (PRINIVIL,ZESTRIL) tablet 20 mg  20 mg Oral Daily Jenne Campus, MD   20 mg at 12/01/16 1303   And  . hydrochlorothiazide (MICROZIDE) capsule 12.5 mg  12.5 mg Oral Daily Myer Peer Cobos, MD   12.5 mg at 12/01/16 1303  . ibuprofen (ADVIL,MOTRIN) tablet 600 mg  600 mg Oral Q6H PRN Encarnacion Slates, NP      . insulin aspart (novoLOG) injection 0-15 Units  0-15 Units Subcutaneous TID WC Ursula Alert, MD   2 Units at 12/01/16 1200  . insulin aspart (novoLOG) injection 0-5 Units  0-5 Units Subcutaneous QHS Saramma Eappen, MD      . magnesium hydroxide (MILK OF MAGNESIA) suspension 30 mL  30 mL Oral Daily PRN Ethelene Hal, NP      . metoprolol succinate (TOPROL-XL) 24 hr tablet 100 mg  100 mg Oral Daily Encarnacion Slates, NP   100 mg at 12/01/16 1302  . nicotine polacrilex (NICORETTE) gum 2 mg  2 mg Oral PRN Jenne Campus, MD      . pregabalin (LYRICA) capsule 100 mg  100 mg Oral BID Encarnacion Slates, NP      . traZODone (DESYREL) tablet 100 mg  100 mg Oral QHS Encarnacion Slates, NP   100 mg at 11/30/16 2122   Lab Results:  Results for orders placed or performed during the hospital encounter of 11/25/16 (from the past 48 hour(s))  Glucose, capillary     Status: Abnormal   Collection Time: 11/29/16  5:10 PM  Result Value Ref Range   Glucose-Capillary 106 (H) 65 - 99 mg/dL  Glucose, capillary     Status: Abnormal   Collection Time: 11/29/16  9:17 PM  Result Value Ref Range   Glucose-Capillary 130 (H) 65 - 99 mg/dL  Glucose, capillary     Status: Abnormal   Collection Time: 11/30/16  5:40 AM  Result Value Ref Range   Glucose-Capillary 129 (H) 65 - 99 mg/dL  Glucose, capillary     Status: Abnormal   Collection Time: 11/30/16 12:03 PM  Result Value Ref Range   Glucose-Capillary 139 (H) 65 - 99 mg/dL   Comment 1 Notify RN    Glucose, capillary     Status: Abnormal   Collection Time: 11/30/16  5:02 PM  Result Value Ref Range   Glucose-Capillary 106 (H) 65 - 99 mg/dL   Comment 1 Notify  RN    Comment 2 Document in Chart   Glucose, capillary     Status: None   Collection Time: 11/30/16  9:04 PM  Result Value Ref Range   Glucose-Capillary 98 65 - 99 mg/dL   Comment 1 Notify RN   Glucose, capillary     Status: Abnormal   Collection Time: 12/01/16  6:07 AM  Result Value Ref Range   Glucose-Capillary 122 (H) 65 - 99 mg/dL  Glucose, capillary     Status: Abnormal   Collection Time: 12/01/16 11:45 AM  Result Value Ref Range   Glucose-Capillary 130 (H) 65 - 99 mg/dL   Comment 1 Notify RN    Comment 2 Document in Chart    Blood Alcohol level:  Lab Results  Component Value Date   [facility] <11 02/15/2013   ETH <11 76/73/4193   Metabolic Disorder Labs: Lab Results  Component Value Date   HGBA1C 6.0 (H) 11/27/2016   MPG 126 11/27/2016   No results found for: PROLACTIN No results found for: CHOL, TRIG, HDL, CHOLHDL, VLDL, LDLCALC  Physical Findings: AIMS: Facial and Oral Movements Muscles of Facial Expression: None, normal Lips and Perioral Area: None, normal Jaw: None, normal Tongue: None, normal,Extremity Movements Upper (arms, wrists, hands, fingers): None, normal Lower (legs, knees, ankles, toes): None, normal, Trunk Movements Neck, shoulders, hips: None, normal, Overall Severity Severity of abnormal movements (highest score from questions above): None, normal Incapacitation due to abnormal movements: None, normal Patient's awareness of abnormal movements (rate only patient's report): No Awareness, Dental Status Current problems with teeth and/or dentures?: No Does patient usually wear dentures?: No  CIWA:  CIWA-Ar Total: 6 COWS:  COWS Total Score: 0  Musculoskeletal: Strength & Muscle Tone: within normal limits Gait & Station: normal Patient leans: N/A  Psychiatric Specialty Exam: Physical  Exam  Nursing note and vitals reviewed. Psychiatric: He has a normal mood and affect. His speech is normal and behavior is normal. Judgment and thought content normal. Cognition and memory are normal.    Review of Systems  Psychiatric/Behavioral: Positive for depression ("improving") and substance abuse (Hx. polysubstance dependence). Negative for hallucinations, memory loss and suicidal ideas. The patient has insomnia ("Improving"). The patient is not nervous/anxious.     Blood pressure (!) 155/85, pulse 100, temperature 97.5 F (36.4 C), resp. rate 16, height 6' 1"  (1.854 m), weight 81.6 kg (180 lb), SpO2 100 %.Body mass index is 23.75 kg/m.  General Appearance: improved grooming   Eye Contact:  Good  Speech:  Mild dysarthria, seems improved today  Volume:  Normal  Mood:  Denies significant depression,  Anxious  Affect:  Remains anxious, reactive affect, smiles at times appropriately   Thought Process:  Linear  Orientation:  Full (Time, Place, and Person)  Thought Content:  Rumination about disposition issues as above, denies hallucinations, no delusions expressed   Suicidal Thoughts:  No denies suicidal or self injurious ideations and contracts for safety on unit  Homicidal Thoughts:  No denies any homicidal or violent ideations  Memory:  Recent and remote grossly intact   Judgement:  Improving   Insight: improving   Psychomotor Activity:  Normal  Concentration:  Concentration: Good and Attention Span: Good  Recall:  Good  Fund of Knowledge:  Good  Language:  Good  Akathisia:  No  Handed:  Right  AIMS (if indicated):     Assets:  Desire for Improvement Resilience Social Support  ADL's:  Intact  Cognition:  WNL  Sleep:  Number of  Hours: 5.75    Assessment - patient presents with partial improvement- at this time denies severe depression and affect is reactive. Does present anxious, and tends to ruminate , worry about marital and financial stressors. Patient had recent  neck/cervical spine  surgery in December, states that after that he has been told by several people he is slurring his words- initially also had some dysphagia, but this resolved. As reported and per presentation, dysarthria seems to be improving slowly- no associated symptoms reported and Head MRI was negative for acute changes or pathology.  Treatment Plan Summary: Review of chart, vital signs, medications, and notes.  1-Individual and group therapy  2-Medication management for depression and anxiety: Medications reviewed with the patient.    Completing Clonidine  detox protocol  for opiate withdrawal.    Continue  Cymbalta 40 mg  QDAY for depression/anxiety  Increased Neurontin to 200 mgrs QID for anxiety and pain- side effects discussed   Continue Vistaril 25 mgrs Q 6 hours PRN for anxiety as needed   Disposition planning in progress, patient planning on going to Rehab on discharge [facility].  Have recommended that patient follow up with an outpatient Neurologist after discharge in order to monitor and manage report of dysarthria earlier this week . Patient expresses understanding. Will be discharged in the morning.  Encarnacion Slates, NP, PMHNP, FNP-BC 12/01/2016, 1:54 PM     I agree to notes and plan. Discharge planning done

## 2016-12-01 NOTE — Discharge Summary (Signed)
Physician Discharge Summary Note  Patient:  Raymond Melton is an 61 y.o., male  MRN:  [id number]  DOB:  [birth year]  Patient phone:  [phone] (home)   Patient address:   [address] [place] [id number],   Total Time spent with patient: Greater than 30 minutes  Date of Admission:  11/25/2016  Date of Discharge: 12-01-16  Reason for Admission: Opioid detox.   Principal Problem: MDD (major depressive disorder), recurrent severe, without psychosis [facility](HCC)  Discharge Diagnoses: Patient Active Problem List   Diagnosis Date Noted  . Opioid dependence with opioid-induced disorder (HCC) [F11.29] 11/26/2016  . History of neck surgery [Z98.890] 11/26/2016  . Hx of hepatitis C [Z86.19] 11/26/2016  . Hypertension [I10] 11/26/2016  . Diabetes (HCC) [E11.9] 11/26/2016  . MDD (major depressive disorder), recurrent severe, without psychosis (HCC) [F33.2] 11/25/2016   Past Psychiatric History: Opioid use disorder, dependence, MDD  Past Medical History:  Past Medical History:  Diagnosis Date  . Alcohol abuse   . Chronic pain   . Diabetes mellitus without complication (HCC)   . Hepatitis C   . Hypertension   . Immune deficiency disorder (HCC)   . Kidney stones   . TB lung, latent     Past Surgical History:  Procedure Laterality Date  . ABDOMINAL SURGERY    . ANKLE SURGERY    . CHOLECYSTECTOMY    . ELBOW SURGERY    . EYE SURGERY    . HERNIA REPAIR    . WRIST SURGERY     Family History:  Family History  Problem Relation Age of Onset  . Depression Mother   . Hypertension Mother    Family Psychiatric  History: See H&P  Social History:  History  Alcohol Use  . Yes    Comment: daily     History  Drug Use No    Social History   Social History  . Marital status: Married    Spouse name: N/A  . Number of children: N/A  . Years of education: N/A   Social History Main Topics  . Smoking status: Current Some Day Smoker    Packs/day:  0.50    Types: Cigarettes  . Smokeless tobacco: Never Used  . Alcohol use Yes     Comment: daily  . Drug use: No  . Sexual activity: Not Currently   Other Topics Concern  . None   Social History Narrative  . None   Hospital Course:  Raymond Melton a [address]60 [facility].  [facility] after his wife told him that she would leave if he did not get help.  He admits to a drug problem that spans over 40 years.  However, after neck surgery, his dependence on Oxycodone worsened. He denies current alcohol or other substance use. He reports feeling very depressed.  He states that his wife found him unreliable and that he was not functioning properly.  He started to neglect himself.  He denies any current SI HI and AVH.   Raymond Melton [facility] for opioid detoxification treatments. He did admit to having been abusing  For over 40 years. Apparently, he was given an ultimatum by his wife to get help with his drug issues she will leave him. Raymond Melton was also presenting with symptoms of depression, possibly substance induced. He was in need of opioid detox as well as mood stabilization treatments.   After admission assessment/ evaluation, his presenting  symptoms were identified. The medication regimen targeting those symptoms were discussed & initiated. He received Clonidine detoxification treatment protocols to combat the withdrawal symptoms of opioid. He was also medicated & discharged on  Duloxetine 40 mg for depression, Gabapentin 200 mg for agitation, Hydroxyzine 25 mg prn for anxiety & Trazodone 100 mg for insomnia. He was resumed on his pertinent home medications for his pre-existing medical issues. He was also enrolled & participated in the group counseling sessions being offered & held on this unit. He learned coping skills that should help him further to cope better & manage his depression/substance abuse issues after discharge. Part of his treatment  regimen & discharge plans for to a placement to a long term substance abuse treatment center for further substance abuse treatment.  Raymond Melton has completed detox treatment & his mood is stable. This is evidenced by his reports of improved mood, absence of suicidal ideations & substance withdrawal symptoms. He is currently being discharged to continue further substance abuse treatment at the [facility] in [place]. He is provided with all the pertinent information needed to make this appointment without problems.  Upon discharge, Raymond Melton adamantly denies any SIHI, AVH, delusional thoughts, paranoia or substance withdrawal symptoms. He left [facility] with all personal belongings in no apparent distress. Transportation per wife.  Physical Findings: AIMS: Facial and Oral Movements Muscles of Facial Expression: None, normal Lips and Perioral Area: None, normal Jaw: None, normal Tongue: None, normal,Extremity Movements Upper (arms, wrists, hands, fingers): None, normal Lower (legs, knees, ankles, toes): None, normal, Trunk Movements Neck, shoulders, hips: None, normal, Overall Severity Severity of abnormal movements (highest score from questions above): None, normal Incapacitation due to abnormal movements: None, normal Patient's awareness of abnormal movements (rate only patient's report): No Awareness, Dental Status Current problems with teeth and/or dentures?: No Does patient usually wear dentures?: No  CIWA:  CIWA-Ar Total: 0 COWS:  COWS Total Score: 0  Musculoskeletal: Strength & Muscle Tone: within normal limits Gait & Station: normal Patient leans: N/A  Psychiatric Specialty Exam: Physical Exam  Constitutional: He appears well-developed.  HENT:  Head: Normocephalic.  Eyes: Pupils are equal, round, and reactive to light.  Neck: Normal range of motion.  Cardiovascular: Normal rate.   Respiratory: Effort normal.  GI: Soft.  Genitourinary:  Genitourinary Comments: Denies any  issues in this area  Musculoskeletal: Normal range of motion.  Neurological: He is alert.  Skin: Skin is warm.    Review of Systems  Constitutional: Negative.   HENT: Negative.   Eyes: Negative.   Cardiovascular: Negative.   Gastrointestinal: Negative.   Genitourinary: Negative.   Musculoskeletal: Negative.   Skin: Negative.   Neurological: Negative.   Endo/Heme/Allergies: Negative.   Psychiatric/Behavioral: Positive for depression (Stable) and substance abuse (Hx. Opioid use disorder, chronic). Negative for hallucinations, memory loss and suicidal ideas. The patient has insomnia (Stable). The patient is not nervous/anxious.     Blood pressure (!) 155/85, pulse 100, temperature 97.5 F (36.4 C), resp. rate 16, height 6\' 1"  (1.854 m), weight 81.6 kg (180 lb), SpO2 100 %.Body mass index is 23.75 kg/m.  See Md's SRA  Have you used any form of tobacco in the last 30 days? (Cigarettes, Smokeless Tobacco, Cigars, and/or Pipes): Yes  Has this patient used any form of tobacco in the last 30 days? (Cigarettes, Smokeless Tobacco, Cigars, and/or Pipes):Yes, provided with nicorette gum prescription.  Blood Alcohol level:  Lab Results  Component Value Date   [facility] <11 02/15/2013  ETH <11 01/13/2013   Metabolic Disorder Labs:  Lab Results  Component Value Date   HGBA1C 6.0 (H) 11/27/2016   MPG 126 11/27/2016   No results found for: PROLACTIN No results found for: CHOL, TRIG, HDL, CHOLHDL, VLDL, LDLCALC  See Psychiatric Specialty Exam and Suicide Risk Assessment completed by Attending Physician prior to discharge.  Discharge destination:  Home  Is patient on multiple antipsychotic therapies at discharge:  No   Has Patient had three or more failed trials of antipsychotic monotherapy by history:  No  Recommended Plan for Multiple Antipsychotic Therapies: NA  Allergies as of 12/01/2016      Reactions   Contrast Media [iodinated Diagnostic Agents] Anaphylaxis, Swelling   Ioxaglate  Anaphylaxis, Swelling   Metrizamide Anaphylaxis, Swelling   Other Anaphylaxis, Shortness Of Breath, Swelling   Iv dye   Codeine Itching   Other reaction(s): Other (See Comments) restless   Iohexol Other (See Comments)   Latex    Lisinopril    Tuberculin    Other reaction(s): Other (See Comments) Severe skin reaction   Tuberculin Tests Other (See Comments)   Severe skin reaction   Valsartan    Zolpidem Other (See Comments)   confusion      Medication List    STOP taking these medications   ALPRAZolam 1 MG tablet Commonly known as:  XANAX   amoxicillin-clavulanate 875-125 MG tablet Commonly known as:  AUGMENTIN   azithromycin 250 MG tablet Commonly known as:  ZITHROMAX   BELSOMRA 20 MG Tabs Generic drug:  Suvorexant   buprenorphine 20 MCG/HR Ptwk patch Commonly known as:  BUTRANS - dosed mcg/hr   cetirizine 10 MG tablet Commonly known as:  ZYRTEC   cyclobenzaprine 10 MG tablet Commonly known as:  FLEXERIL   cyclobenzaprine 5 MG tablet Commonly known as:  FLEXERIL   dicyclomine 20 MG tablet Commonly known as:  BENTYL   diphenhydrAMINE 25 mg capsule Commonly known as:  BENADRYL   dronabinol 10 MG capsule Commonly known as:  MARINOL   fluorometholone 0.1 % ophthalmic suspension Commonly known as:  FML   folic acid 1 MG tablet Commonly known as:  FOLVITE   hydrOXYzine 25 MG capsule Commonly known as:  VISTARIL   ibuprofen 600 MG tablet Commonly known as:  ADVIL,MOTRIN   ibuprofen 800 MG tablet Commonly known as:  ADVIL,MOTRIN   lisinopril-hydrochlorothiazide 20-12.5 MG tablet Commonly known as:  PRINZIDE,ZESTORETIC   LYRICA 100 MG capsule Generic drug:  pregabalin   mirtazapine 15 MG tablet Commonly known as:  REMERON   oxyCODONE 10 mg 12 hr tablet Commonly known as:  OXYCONTIN   oxyCODONE 5 MG immediate release tablet Commonly known as:  Oxy IR/ROXICODONE   Oxycodone HCl 10 MG Tabs   oxyCODONE-acetaminophen 5-325 MG tablet Commonly  known as:  PERCOCET/ROXICET   OXYCONTIN 20 mg 12 hr tablet Generic drug:  oxyCODONE   PANCRELIPASE (LIP-PROT-AMYL) PO   PREVALITE 4 g packet Generic drug:  cholestyramine light   PREVNAR 13 Susp injection Generic drug:  pneumococcal 13-valent conjugate vaccine   promethazine 25 MG tablet Commonly known as:  PHENERGAN   QUEtiapine Fumarate 150 MG 24 hr tablet Commonly known as:  SEROQUEL XR   sitaGLIPtin-metformin 50-1000 MG tablet Commonly known as:  JANUMET   temazepam 15 MG capsule Commonly known as:  RESTORIL   thiamine 100 MG tablet   tiZANidine 4 MG tablet Commonly known as:  ZANAFLEX   triamcinolone cream 0.5 % Commonly known as:  KENALOG  Vitamins/Minerals Tabs   ziprasidone 20 MG capsule Commonly known as:  GEODON     TAKE these medications     Indication  albuterol 108 (90 Base) MCG/ACT inhaler Commonly known as:  PROAIR HFA INHALE 1-2 PUFFS EVERY 6 HOURS AS NEEDED: For Shortness of breath What changed:  See the new instructions.  Indication:  Asthma   amLODipine 5 MG tablet Commonly known as:  NORVASC Take 1 tablet (5 mg total) by mouth daily. For high blood pressure What changed:  additional instructions  Another medication with the same name was removed. Continue taking this medication, and follow the directions you see here.  Indication:  High Blood Pressure Disorder   DULoxetine HCl 40 MG Cpep Take 40 mg by mouth daily. For depression Start taking on:  12/02/2016  Indication:  Major Depressive Disorder   gabapentin 100 MG capsule Commonly known as:  NEURONTIN Take 2 capsules (200 mg total) by mouth 4 (four) times daily - after meals and at bedtime. For agitation  Indication:  Agitation   hydrOXYzine 25 MG tablet Commonly known as:  ATARAX/VISTARIL Take 1 tablet (25 mg total) by mouth every 6 (six) hours as needed for anxiety.  Indication:  Anxiety Neurosis   metoprolol succinate 100 MG 24 hr tablet Commonly known as:   TOPROL-XL Take 1 tablet (100 mg total) by mouth daily. Take with or immediately following a meal: For high blood pressure What changed:  additional instructions  Another medication with the same name was removed. Continue taking this medication, and follow the directions you see here.  Indication:  High Blood Pressure Disorder   nicotine polacrilex 2 MG gum Commonly known as:  NICORETTE Take 1 each (2 mg total) by mouth as needed for smoking cessation.  Indication:  Nicotine Addiction   ondansetron 4 MG disintegrating tablet Commonly known as:  ZOFRAN-ODT Take 1 tablet (4 mg total) by mouth every 6 (six) hours as needed. For nausea What changed:  additional instructions  Indication:  Nausea   traZODone 100 MG tablet Commonly known as:  DESYREL Take 1 tablet (100 mg total) by mouth at bedtime. For sleep What changed:  medication strength  how much to take  when to take this  additional instructions  Indication:  Trouble Sleeping      Follow-up Information    [facility] Follow up on 12/02/2016.   Why:  You have been accepted for admission on Monday 12/02/16. Please be waiting in lobby at 1:00PM for transport to facility. Thank you.  Contact information: [address] [place], [place] [address] Phone: [phone] Fax: [phone]         Follow-up recommendations: Activity:  As tolerated Diet: As recommended by your primary care doctor. Keep all scheduled follow-up appointments as recommended.   Comments: Patient is instructed prior to discharge to: Take all medications as prescribed by his/her mental healthcare provider. Report any adverse effects and or reactions from the medicines to his/her outpatient provider promptly. Patient has been instructed & cautioned: To not engage in alcohol and or illegal drug use while on prescription medicines. In the event of worsening symptoms, patient is instructed to call the crisis hotline, 911 and or go to the nearest ED for  appropriate evaluation and treatment of symptoms. To follow-up with his/her primary care provider for your other medical issues, concerns and or health care needs.   Signed: Sanjuana Kava, NP, PMHNP, FNP-BC 12/01/2016, 4:53 PM  I have examined the patient and agree with  the discharge plan and findings. I have also done suicide assessment on this patient.

## 2016-12-02 LAB — GLUCOSE, CAPILLARY: Glucose-Capillary: 106 mg/dL — ABNORMAL HIGH (ref 65–99)

## 2016-12-02 NOTE — Progress Notes (Signed)
D: Pt at the time of assessment was alert and oriented x4. Pt endorsed moderate anxiety and depression; states, "I will be going to a long-term facility tomorrow; I'm a little nervous about that."  Pt denies SI, HI, pain or HVH of the time of assessment.  A: Medications offered as prescribed.  Support, encouragement, and safe environment provided.  15-minute safety checks continue. R: Pt was med compliant.  Pt attended group. Safety checks continue.

## 2016-12-02 NOTE — Progress Notes (Signed)
Pt denied SI, HI, and AVH this morning. Patient was discharged per order. AVS, medications, scripts, and suicide safety plan were all reviewed with patient. Pt was given an opportunity to ask questions and verbalized understanding of all discharge paperwork. Belongings were returned, and patient signed for receipt. Patient verbalized readiness for discharge and appeared in no acute distress when escorted to lobby.

## 2016-12-17 ENCOUNTER — Ambulatory Visit (INDEPENDENT_AMBULATORY_CARE_PROVIDER_SITE_OTHER): Payer: BC Managed Care – PPO | Admitting: Licensed Clinical Social Worker

## 2016-12-17 DIAGNOSIS — M5030 Other cervical disc degeneration, unspecified cervical region: Secondary | ICD-10-CM | POA: Insufficient documentation

## 2016-12-17 DIAGNOSIS — F332 Major depressive disorder, recurrent severe without psychotic features: Secondary | ICD-10-CM | POA: Diagnosis not present

## 2016-12-17 DIAGNOSIS — F1120 Opioid dependence, uncomplicated: Secondary | ICD-10-CM
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
## 2016-12-18 ENCOUNTER — Other Ambulatory Visit (HOSPITAL_COMMUNITY): Payer: BC Managed Care – PPO

## 2016-12-19 ENCOUNTER — Other Ambulatory Visit (HOSPITAL_COMMUNITY): Payer: BC Managed Care – PPO

## 2016-12-23 ENCOUNTER — Other Ambulatory Visit (HOSPITAL_COMMUNITY): Payer: BC Managed Care – PPO

## 2016-12-23 ENCOUNTER — Ambulatory Visit (HOSPITAL_COMMUNITY): Payer: Self-pay | Admitting: Psychology

## 2016-12-25 ENCOUNTER — Other Ambulatory Visit (HOSPITAL_COMMUNITY): Payer: BC Managed Care – PPO

## 2016-12-26 ENCOUNTER — Other Ambulatory Visit (HOSPITAL_COMMUNITY): Payer: BC Managed Care – PPO

## 2016-12-30 ENCOUNTER — Encounter (HOSPITAL_COMMUNITY): Payer: Self-pay | Admitting: Licensed Clinical Social Worker

## 2016-12-30 ENCOUNTER — Other Ambulatory Visit (HOSPITAL_COMMUNITY): Payer: BC Managed Care – PPO

## 2016-12-30 ENCOUNTER — Telehealth (HOSPITAL_COMMUNITY): Payer: Self-pay | Admitting: Psychology

## 2017-01-01 ENCOUNTER — Other Ambulatory Visit (HOSPITAL_COMMUNITY): Payer: BC Managed Care – PPO

## 2017-01-02 ENCOUNTER — Other Ambulatory Visit (HOSPITAL_COMMUNITY): Payer: BC Managed Care – PPO

## 2017-01-06 ENCOUNTER — Other Ambulatory Visit (HOSPITAL_COMMUNITY): Payer: BC Managed Care – PPO

## 2017-01-06 ENCOUNTER — Encounter (HOSPITAL_COMMUNITY): Payer: Self-pay | Admitting: Licensed Clinical Social Worker

## 2017-01-06 NOTE — Progress Notes (Signed)
CD-IOP Discharge Summary  Raymond PrimesMichael James Melton    12-22-1955   Admission Date: 12/23/16   Discharge Date:  12/30/16 Reason for Discharge:  Pt did not appear for tx. Diagnosis: F11.20    Comments:  Patient did not appear for CD-IOP group session for 3 consecutive days. When counselor called to discuss tx, patient did not want to speak to counselor. Counselor spoke with pt spouse and encourage pt to [facility].   Margo CommonWesley E Helga Asbury

## 2017-01-08 ENCOUNTER — Other Ambulatory Visit (HOSPITAL_COMMUNITY): Payer: BC Managed Care – PPO

## 2017-01-09 ENCOUNTER — Other Ambulatory Visit (HOSPITAL_COMMUNITY): Payer: BC Managed Care – PPO

## 2017-01-13 ENCOUNTER — Other Ambulatory Visit (HOSPITAL_COMMUNITY): Payer: BC Managed Care – PPO

## 2017-01-15 ENCOUNTER — Other Ambulatory Visit (HOSPITAL_COMMUNITY): Payer: BC Managed Care – PPO

## 2017-01-16 ENCOUNTER — Other Ambulatory Visit (HOSPITAL_COMMUNITY): Payer: BC Managed Care – PPO

## 2017-01-20 ENCOUNTER — Encounter (HOSPITAL_COMMUNITY): Payer: Self-pay | Admitting: Licensed Clinical Social Worker

## 2017-01-20 ENCOUNTER — Telehealth (HOSPITAL_COMMUNITY): Payer: Self-pay | Admitting: Licensed Clinical Social Worker

## 2017-01-20 ENCOUNTER — Other Ambulatory Visit (HOSPITAL_COMMUNITY): Payer: BC Managed Care – PPO
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
## 2017-01-22 ENCOUNTER — Other Ambulatory Visit (HOSPITAL_COMMUNITY): Payer: BC Managed Care – PPO

## 2017-01-23 ENCOUNTER — Other Ambulatory Visit (HOSPITAL_COMMUNITY): Payer: BC Managed Care – PPO

## 2017-01-23 ENCOUNTER — Telehealth (HOSPITAL_COMMUNITY): Payer: Self-pay | Admitting: Licensed Clinical Social Worker

## 2017-01-23 NOTE — Telephone Encounter (Signed)
Phoned pt to confirm that counselor sent referral paperwork to [facility] [place].

## 2017-01-27 ENCOUNTER — Other Ambulatory Visit (HOSPITAL_COMMUNITY): Payer: BC Managed Care – PPO

## 2017-01-29 ENCOUNTER — Other Ambulatory Visit (HOSPITAL_COMMUNITY): Payer: BC Managed Care – PPO

## 2017-01-30 ENCOUNTER — Other Ambulatory Visit (HOSPITAL_COMMUNITY): Payer: BC Managed Care – PPO

## 2017-02-03 ENCOUNTER — Other Ambulatory Visit (HOSPITAL_COMMUNITY): Payer: BC Managed Care – PPO

## 2017-02-05 ENCOUNTER — Other Ambulatory Visit (HOSPITAL_COMMUNITY): Payer: BC Managed Care – PPO

## 2017-02-06 ENCOUNTER — Other Ambulatory Visit (HOSPITAL_COMMUNITY): Payer: BC Managed Care – PPO

## 2017-02-10 ENCOUNTER — Other Ambulatory Visit (HOSPITAL_COMMUNITY): Payer: BC Managed Care – PPO

## 2017-02-12 ENCOUNTER — Other Ambulatory Visit (HOSPITAL_COMMUNITY): Payer: BC Managed Care – PPO

## 2017-02-13 ENCOUNTER — Other Ambulatory Visit (HOSPITAL_COMMUNITY): Payer: BC Managed Care – PPO

## 2017-02-17 ENCOUNTER — Other Ambulatory Visit (HOSPITAL_COMMUNITY): Payer: BC Managed Care – PPO

## 2017-02-19 ENCOUNTER — Other Ambulatory Visit (HOSPITAL_COMMUNITY): Payer: BC Managed Care – PPO

## 2017-02-20 ENCOUNTER — Other Ambulatory Visit (HOSPITAL_COMMUNITY): Payer: BC Managed Care – PPO

## 2017-02-24 ENCOUNTER — Other Ambulatory Visit (HOSPITAL_COMMUNITY): Payer: BC Managed Care – PPO

## 2017-02-26 ENCOUNTER — Other Ambulatory Visit (HOSPITAL_COMMUNITY): Payer: BC Managed Care – PPO

## 2017-02-27 ENCOUNTER — Other Ambulatory Visit (HOSPITAL_COMMUNITY): Payer: BC Managed Care – PPO

## 2017-03-03 ENCOUNTER — Other Ambulatory Visit (HOSPITAL_COMMUNITY): Payer: BC Managed Care – PPO

## 2017-03-05 ENCOUNTER — Other Ambulatory Visit (HOSPITAL_COMMUNITY): Payer: BC Managed Care – PPO

## 2017-03-06 ENCOUNTER — Other Ambulatory Visit (HOSPITAL_COMMUNITY): Payer: BC Managed Care – PPO

## 2017-03-10 ENCOUNTER — Other Ambulatory Visit (HOSPITAL_COMMUNITY): Payer: BC Managed Care – PPO

## 2017-03-12 ENCOUNTER — Other Ambulatory Visit (HOSPITAL_COMMUNITY): Payer: BC Managed Care – PPO

## 2017-03-13 ENCOUNTER — Other Ambulatory Visit (HOSPITAL_COMMUNITY): Payer: BC Managed Care – PPO

## 2017-03-17 ENCOUNTER — Other Ambulatory Visit (HOSPITAL_COMMUNITY): Payer: BC Managed Care – PPO

## 2017-03-19 ENCOUNTER — Other Ambulatory Visit (HOSPITAL_COMMUNITY): Payer: BC Managed Care – PPO

## 2017-05-22 DIAGNOSIS — Z981 Arthrodesis status: Secondary | ICD-10-CM | POA: Insufficient documentation

## 2017-05-22 DIAGNOSIS — K861 Other chronic pancreatitis: Secondary | ICD-10-CM | POA: Insufficient documentation

## 2017-05-22 DIAGNOSIS — Z9001 Acquired absence of eye: Secondary | ICD-10-CM | POA: Insufficient documentation

## 2017-05-22 DIAGNOSIS — M5412 Radiculopathy, cervical region: Secondary | ICD-10-CM | POA: Insufficient documentation

## 2017-05-22 DIAGNOSIS — B182 Chronic viral hepatitis C: Secondary | ICD-10-CM | POA: Insufficient documentation

## 2017-05-22 DIAGNOSIS — R29898 Other symptoms and signs involving the musculoskeletal system: Secondary | ICD-10-CM | POA: Insufficient documentation

## 2017-05-22 DIAGNOSIS — G5601 Carpal tunnel syndrome, right upper limb: Secondary | ICD-10-CM | POA: Insufficient documentation

## 2017-05-22 DIAGNOSIS — M7918 Myalgia, other site: Secondary | ICD-10-CM | POA: Insufficient documentation

## 2017-05-23 DIAGNOSIS — M542 Cervicalgia: Secondary | ICD-10-CM | POA: Insufficient documentation

## 2017-06-17 DIAGNOSIS — G5621 Lesion of ulnar nerve, right upper limb: Secondary | ICD-10-CM | POA: Insufficient documentation

## 2017-06-24 DIAGNOSIS — S129XXA Fracture of neck, unspecified, initial encounter: Secondary | ICD-10-CM | POA: Insufficient documentation

## 2017-12-09 DIAGNOSIS — G459 Transient cerebral ischemic attack, unspecified: Secondary | ICD-10-CM | POA: Insufficient documentation

## 2017-12-17 ENCOUNTER — Telehealth: Payer: Self-pay | Admitting: Neurology

## 2017-12-17 ENCOUNTER — Ambulatory Visit (INDEPENDENT_AMBULATORY_CARE_PROVIDER_SITE_OTHER): Payer: Medicare Other | Admitting: Neurology

## 2017-12-17 VITALS — BP 153/85 | HR 68 | Ht 73.0 in | Wt 178.0 lb

## 2017-12-17 DIAGNOSIS — E538 Deficiency of other specified B group vitamins: Secondary | ICD-10-CM | POA: Diagnosis not present

## 2017-12-17 DIAGNOSIS — R4182 Altered mental status, unspecified: Secondary | ICD-10-CM | POA: Insufficient documentation

## 2017-12-17 DIAGNOSIS — R404 Transient alteration of awareness: Secondary | ICD-10-CM | POA: Diagnosis not present

## 2017-12-17 HISTORY — DX: Altered mental status, unspecified: R41.82

## 2017-12-17 NOTE — Telephone Encounter (Signed)
When you get a chance can you please change the MRA Neck order to a MRA Neck w/wo contrast?
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
## 2017-12-17 NOTE — Patient Instructions (Signed)
   We will get an EEG and get MRA of the head and neck.

## 2017-12-17 NOTE — Telephone Encounter (Signed)
The MRA neck orders have been altered.

## 2017-12-18 LAB — HIV ANTIBODY (ROUTINE TESTING W REFLEX): HIV SCREEN 4TH GENERATION: NONREACTIVE

## 2017-12-18 LAB — ANA W/REFLEX: ANA: NEGATIVE

## 2017-12-18 LAB — AMMONIA: AMMONIA: 67 ug/dL (ref 27–102)

## 2017-12-18 LAB — SEDIMENTATION RATE: SED RATE: 24 mm/h (ref 0–30)

## 2017-12-18 LAB — B. BURGDORFI ANTIBODIES: Lyme IgG/IgM Ab: <0.91 {ISR} (ref 0.00–0.90)

## 2017-12-18 LAB — RPR: RPR: NONREACTIVE

## 2017-12-18 LAB — VITAMIN B12: VITAMIN B 12: 408 pg/mL (ref 232–1245)

## 2017-12-19 ENCOUNTER — Telehealth: Payer: Self-pay | Admitting: *Deleted

## 2017-12-19 NOTE — Telephone Encounter (Signed)
Urine drug screen still pending, all the blood work studies are in.

## 2017-12-19 NOTE — Telephone Encounter (Signed)
-----   Message from York Spanielharles K Willis, MD sent at 12/18/2017  4:54 PM EST -----   The blood work results are unremarkable. Please call the patient.  ----- Message ----- From: Nell RangeInterface, [facility] Lab Results In Sent: 12/18/2017   7:42 AM To: York Spanielharles K Willis, MD

## 2017-12-19 NOTE — Telephone Encounter (Signed)
Called and spoke with patient about results per CW,MD note. Pt verbalized understanding.   Pt wanted to know about drug screen test. I did not see these results in Epic. Advised I will forward message to Dr. Anne HahnWillis about this.

## 2017-12-22 ENCOUNTER — Encounter (HOSPITAL_COMMUNITY): Payer: Self-pay | Admitting: Emergency Medicine

## 2017-12-22 ENCOUNTER — Emergency Department (HOSPITAL_COMMUNITY)
Admission: EM | Admit: 2017-12-22 | Discharge: 2017-12-23 | Disposition: A | Discharge status: Other Acute Inpatient Hospital | Payer: Medicare Other | Attending: Emergency Medicine | Admitting: Emergency Medicine

## 2017-12-22 ENCOUNTER — Ambulatory Visit (HOSPITAL_COMMUNITY)
Admission: RE | Admit: 2017-12-22 | Discharge: 2017-12-22 | Disposition: A | Discharge status: Home / Self Care | Payer: Medicare Other | Source: Home / Self Care | Attending: Psychiatry | Admitting: Psychiatry

## 2017-12-22 DIAGNOSIS — R45851 Suicidal ideations: Secondary | ICD-10-CM

## 2017-12-22 DIAGNOSIS — Z79899 Other long term (current) drug therapy: Secondary | ICD-10-CM | POA: Diagnosis not present

## 2017-12-22 DIAGNOSIS — R450 Nervousness: Secondary | ICD-10-CM

## 2017-12-22 DIAGNOSIS — R55 Syncope and collapse: Secondary | ICD-10-CM

## 2017-12-22 DIAGNOSIS — Z9181 History of falling: Secondary | ICD-10-CM | POA: Insufficient documentation

## 2017-12-22 DIAGNOSIS — F17210 Nicotine dependence, cigarettes, uncomplicated: Secondary | ICD-10-CM | POA: Insufficient documentation

## 2017-12-22 DIAGNOSIS — F329 Major depressive disorder, single episode, unspecified: Secondary | ICD-10-CM | POA: Insufficient documentation

## 2017-12-22 DIAGNOSIS — M542 Cervicalgia: Secondary | ICD-10-CM

## 2017-12-22 DIAGNOSIS — Z91040 Latex allergy status: Secondary | ICD-10-CM | POA: Diagnosis not present

## 2017-12-22 DIAGNOSIS — E119 Type 2 diabetes mellitus without complications: Secondary | ICD-10-CM | POA: Insufficient documentation

## 2017-12-22 DIAGNOSIS — Z139 Encounter for screening, unspecified: Secondary | ICD-10-CM

## 2017-12-22 DIAGNOSIS — G8929 Other chronic pain: Secondary | ICD-10-CM | POA: Insufficient documentation

## 2017-12-22 DIAGNOSIS — I10 Essential (primary) hypertension: Secondary | ICD-10-CM | POA: Diagnosis not present

## 2017-12-22 NOTE — ED Notes (Signed)
Bed: WTR5 Expected date:  Expected time:  Means of arrival:  Comments: 

## 2017-12-22 NOTE — ED Triage Notes (Signed)
Pt [facility] from [facility] for [facility]. Pt states he is seeking help for depression and SI. Pt states he had a neck sx a few months ago and has had difficulties ever since. Pt states he has had slurred speech, pain, and initially difficulty walking. Pt states this has put strain on his marriage and his wife "will only spend a few hours a day with him". Pt states he is having difficulty sleeping as well. Pt states a few weeks ago he tried to overdose "but he didn't take enough". Pt states he keeps getting sent home when he seeks help for his suicidal thoughts. Pt appears agitated at time of assessment but is cooperative. Per [facility] is holding a bed for pt.

## 2017-12-22 NOTE — ED Provider Notes (Signed)
[facility]-EMERGENCY DEPT Provider Note   CSN: [id number] Arrival date & time: 12/22/17  2132     History   Chief Complaint No chief complaint on file.   HPI Raymond Melton is a 62 y.o. male.  The history is provided by the patient and medical records.     62 year old male with history of alcohol abuse, chronic neck pain, diabetes, hepatitis C, hypertension, latent TB, kidney stones, presenting to the ED for medical clearance.  Patient reports he had a failed neck surgery a few years ago and since then has had some ongoing medical issues including decline in his mobility, chronic pain, visual disturbance, and changes in speech/communication.  States this is because a significant strain in his marriage, states his wife has left him as she cannot "cope with his issues".  States he has been reaching out for help over the past 2 months, but each time is sent to a outpatient facility to undergo "group therapy" which he does not feel is reasonable.  He states "I have nothing in common with these people, they cannot possibly understand my problems".  States last month he did overdose on a mix of his prescribed home medications, states he felt dazed for about 24 hours but was medically fine.  States his wife is intermittently around which seems to confuse him even more.  States he is due for another neck surgery within the next 2 months and states he needs to get his head right before he can undergo that.  He continues to endorse suicidal thoughts with plan to overdose.  He denies any homicidal ideation.  No hallucinations.  States he has had a lot of trouble sleeping because of all this.  He denies any new physical symptoms related to his neck, all are chronic and unchanged.  Past Medical History:  Diagnosis Date  . Alcohol abuse   . Altered mental status 12/17/2017  . Chronic pain   . Diabetes mellitus without complication (HCC)   . Hepatitis C   . Hypertension   .  Immune deficiency disorder (HCC)   . Kidney stones   . TB lung, latent     Patient Active Problem List   Diagnosis Date Noted  . Altered mental status 12/17/2017  . Opioid use disorder, severe, dependence (HCC) 11/26/2016  . History of neck surgery 11/26/2016  . Hx of hepatitis C 11/26/2016  . Hypertension 11/26/2016  . Diabetes (HCC) 11/26/2016  . MDD (major depressive disorder), recurrent severe, 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    Past Surgical History:  Procedure Laterality Date  . ABDOMINAL SURGERY    . ANKLE SURGERY    . CHOLECYSTECTOMY    . ELBOW SURGERY    . EYE SURGERY    . HERNIA REPAIR    . WRIST SURGERY         Home Medications    Prior to Admission medications   Medication Sig Start Date End Date Taking? Authorizing Provider  ALPRAZolam Prudy Feeler(XANAX) 1 MG tablet Take 1 mg by mouth at bedtime.   Yes [provider]  ergocalciferol (VITAMIN D2) 50000 units capsule Take 50,000 Units by mouth 2 (two) times a week.   Yes [provider]  metoprolol succinate (TOPROL-XL) 100 MG 24 hr tablet Take 1 tablet (100 mg total) by mouth daily. Take with or immediately following a meal: For high blood pressure 12/01/16  Yes Nwoko, Agnes I, NP  promethazine (PHENERGAN) 25 MG tablet Take 25 mg by mouth every  12 (twelve) hours as needed for nausea or vomiting.   Yes [provider]  rosuvastatin (CRESTOR) 10 MG tablet Take 10 mg by mouth daily.   Yes [provider]  tiZANidine (ZANAFLEX) 4 MG tablet Take 4 mg by mouth every 8 (eight) hours as needed for muscle spasms.   Yes [provider]    Family History Family History  Problem Relation Age of Onset  . Depression Mother   . Hypertension Mother   . Leukemia Mother   . Stroke Father   . Hypertension Father   . Cancer Sister   . Hypertension Sister   . Cancer Brother        bladder  . Hypertension Brother     Social History Social History   Tobacco Use  . Smoking status:  Current Some Day Smoker    Packs/day: 0.50    Types: Cigarettes  . Smokeless tobacco: Never Used  Substance Use Topics  . Alcohol use: Yes    Comment: daily  . Drug use: No     Allergies   Contrast media [iodinated diagnostic agents]; Ioxaglate; Metrizamide; Other; Codeine; Iohexol; Latex; Lisinopril; Tuberculin; Tuberculin tests; Valsartan; and Zolpidem   Review of Systems Review of Systems  Psychiatric/Behavioral: Positive for suicidal ideas.  All other systems reviewed and are negative.    Physical Exam Updated Vital Signs BP (!) 130/55 (BP Location: Left Arm)   Pulse 71   Temp 98.9 F (37.2 C) (Oral)   Resp 20   SpO2 97%   Physical Exam  Constitutional: He is oriented to person, place, and time. He appears well-developed and well-nourished.  HENT:  Head: Normocephalic and atraumatic.  Mouth/Throat: Oropharynx is clear and moist.  Eyes: Conjunctivae and EOM are normal. Pupils are equal, round, and reactive to light.  Left eye enucleated, wearing eye patch  Neck: Normal range of motion.  Cardiovascular: Normal rate, regular rhythm and normal heart sounds.  Pulmonary/Chest: Effort normal and breath sounds normal.  Abdominal: Soft. Bowel sounds are normal.  Musculoskeletal: Normal range of motion.  Neurological: He is alert and oriented to person, place, and time.  Skin: Skin is warm and dry.  Psychiatric: He is not actively hallucinating. He exhibits a depressed mood. He expresses suicidal ideation. He expresses no homicidal ideation. He expresses suicidal plans. He expresses no homicidal plans.  Depressed mood, ruminating thoughts Continued SI with plan to overdose Denies HI/AVH  Nursing note and vitals reviewed.    ED Treatments / Results  Labs (all labs ordered are listed, but only abnormal results are displayed) Labs Reviewed  COMPREHENSIVE METABOLIC PANEL - Abnormal; Notable for the following components:      Result Value   Glucose, Bld 116 (*)    All  other components within normal limits  ACETAMINOPHEN LEVEL - Abnormal; Notable for the following components:   Acetaminophen (Tylenol), Serum <10 (*)    All other components within normal limits  CBC - Abnormal; Notable for the following components:   MCH 34.1 (*)    MCHC 36.1 (*)    All other components within normal limits  RAPID URINE DRUG SCREEN, HOSP PERFORMED - Abnormal; Notable for the following components:   Benzodiazepines POSITIVE (*)    All other components within normal limits  ETHANOL  SALICYLATE LEVEL    EKG  EKG Interpretation None       Radiology No results found.  Procedures Procedures (including critical care time)  Medications Ordered in ED Medications - No data to  display   Initial Impression / Assessment and Plan / ED Course  I have reviewed the triage vital signs and the nursing notes.  Pertinent labs & imaging results that were available during my care of the patient were reviewed by me and considered in my medical decision making (see chart for details).  63 year old male sent over from behavioral health for medical screening evaluation.  Patient seen there earlier today, to be admitted for IP psychiatry care due to ongoing SI.  Seems related due to separation with wife.  Admits plan to OD on meds which he tried to do last month unsuccessfully.  Denies HI/AVH.  Screening labs overall reassuring.  Patient medically cleared.  Can be transferred back to [facility] to start his psychiatric care.  Bed 405-1 under care of Dr. Jama Flavors.  Final Clinical Impressions(s) / ED Diagnoses   Final diagnoses:  Encounter for medical screening examination    ED Discharge Orders    None       Garlon Hatchet, PA-C 12/23/17 0150    Linwood Dibbles, MD 12/23/17 [phone]

## 2017-12-22 NOTE — H&P (Signed)
Behavioral Health Medical Screening Exam  Raymond PrimesMichael James Melton is an 62 y.o. male, whom is known to us from prior IP admission a year ago, presents as a walk in due to pervasive and progressive depression with reported SA one month ago and since periodic SI. The source of his emotional discord is due to his chronic pain  And bouts of syncope of unknown etiology. He is seeing a neurologist with work -up ongoing. He denies any recent falls, c/o of CP, SOB, vertigo or dysequilibrium at this present time.  Total Time spent with patient: 30 minutes  Psychiatric Specialty Exam: Physical Exam  Constitutional: He is oriented to person, place, and time. He appears well-developed and well-nourished. No distress.  HENT:  Head: Normocephalic.  Eyes:  Has prosthetic left eye  Respiratory: Effort normal and breath sounds normal. No respiratory distress.  Neurological: He is alert and oriented to person, place, and time. No cranial nerve deficit.  Skin: Skin is warm and dry. He is not diaphoretic.  Psychiatric: His speech is normal and behavior is normal. Judgment normal. Cognition and memory are normal. He exhibits a depressed mood. He expresses suicidal ideation. He expresses suicidal plans.    Review of Systems  Musculoskeletal: Positive for falls and neck pain.  Neurological:       Hx syncope  Endo/Heme/Allergies:       Hx DM  Psychiatric/Behavioral: Positive for depression and suicidal ideas. The patient is nervous/anxious.   All other systems reviewed and are negative.   There were no vitals taken for this visit.There is no height or weight on file to calculate BMI.  General Appearance: Casual  Eye Contact:  Good  Speech:  Clear and Coherent  Volume:  Normal  Mood:  Depressed  Affect:  Congruent  Thought Process:  Goal Directed  Orientation:  Full (Time, Place, and Person)  Thought Content:  Logical  Suicidal Thoughts:  No  Homicidal Thoughts:  Yes.  without intent/plan  Memory:  Recent;    Good  Judgement:  Good  Insight:  Good  Psychomotor Activity:  Negative  Concentration: Concentration: Good  Recall:  Good  Fund of Knowledge:Good  Language: Good  Akathisia:  Negative  Handed:  Right  AIMS (if indicated):     Assets:  Desire for Improvement  Sleep:       Musculoskeletal: Strength & Muscle Tone: within normal limits Gait & Station: normal Patient leans: N/A  There were no vitals taken for this visit.  Recommendations:  Based on my evaluation the patient appears to have an emergency medical condition for which I recommend the patient be transferred to the emergency department for further evaluation.  Kerry HoughSpencer E Simon, PA-C 12/22/2017, 11:37 PM
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
## 2017-12-23 ENCOUNTER — Encounter (HOSPITAL_COMMUNITY): Payer: Self-pay

## 2017-12-23 ENCOUNTER — Inpatient Hospital Stay (HOSPITAL_COMMUNITY)
Admission: AD | Admit: 2017-12-23 | Discharge: 2017-12-26 | DRG: 897 | Disposition: A | Discharge status: Home / Self Care | Payer: 59 | Source: Intra-hospital | Attending: Emergency Medicine | Admitting: Emergency Medicine

## 2017-12-23 ENCOUNTER — Other Ambulatory Visit: Payer: Self-pay

## 2017-12-23 DIAGNOSIS — F13230 Sedative, hypnotic or anxiolytic dependence with withdrawal, uncomplicated: Secondary | ICD-10-CM | POA: Diagnosis not present

## 2017-12-23 DIAGNOSIS — Z823 Family history of stroke: Secondary | ICD-10-CM

## 2017-12-23 DIAGNOSIS — F515 Nightmare disorder: Secondary | ICD-10-CM | POA: Diagnosis not present

## 2017-12-23 DIAGNOSIS — F13239 Sedative, hypnotic or anxiolytic dependence with withdrawal, unspecified: Secondary | ICD-10-CM | POA: Diagnosis present

## 2017-12-23 DIAGNOSIS — M62838 Other muscle spasm: Secondary | ICD-10-CM | POA: Diagnosis present

## 2017-12-23 DIAGNOSIS — I10 Essential (primary) hypertension: Secondary | ICD-10-CM | POA: Diagnosis present

## 2017-12-23 DIAGNOSIS — E785 Hyperlipidemia, unspecified: Secondary | ICD-10-CM | POA: Diagnosis present

## 2017-12-23 DIAGNOSIS — R5383 Other fatigue: Secondary | ICD-10-CM | POA: Diagnosis not present

## 2017-12-23 DIAGNOSIS — E119 Type 2 diabetes mellitus without complications: Secondary | ICD-10-CM | POA: Diagnosis present

## 2017-12-23 DIAGNOSIS — G4700 Insomnia, unspecified: Secondary | ICD-10-CM | POA: Diagnosis not present

## 2017-12-23 DIAGNOSIS — F4310 Post-traumatic stress disorder, unspecified: Secondary | ICD-10-CM | POA: Diagnosis not present

## 2017-12-23 DIAGNOSIS — R400 Somnolence: Secondary | ICD-10-CM | POA: Diagnosis present

## 2017-12-23 DIAGNOSIS — F3160 Bipolar disorder, current episode mixed, unspecified: Secondary | ICD-10-CM | POA: Diagnosis not present

## 2017-12-23 DIAGNOSIS — F319 Bipolar disorder, unspecified: Secondary | ICD-10-CM | POA: Diagnosis present

## 2017-12-23 DIAGNOSIS — F19988 Other psychoactive substance use, unspecified with other psychoactive substance-induced disorder: Secondary | ICD-10-CM | POA: Diagnosis present

## 2017-12-23 DIAGNOSIS — F17210 Nicotine dependence, cigarettes, uncomplicated: Secondary | ICD-10-CM | POA: Diagnosis not present

## 2017-12-23 DIAGNOSIS — R450 Nervousness: Secondary | ICD-10-CM | POA: Diagnosis not present

## 2017-12-23 DIAGNOSIS — G894 Chronic pain syndrome: Secondary | ICD-10-CM | POA: Diagnosis not present

## 2017-12-23 DIAGNOSIS — B1920 Unspecified viral hepatitis C without hepatic coma: Secondary | ICD-10-CM

## 2017-12-23 DIAGNOSIS — F419 Anxiety disorder, unspecified: Secondary | ICD-10-CM | POA: Diagnosis present

## 2017-12-23 DIAGNOSIS — R45851 Suicidal ideations: Secondary | ICD-10-CM | POA: Diagnosis present

## 2017-12-23 DIAGNOSIS — H5462 Unqualified visual loss, left eye, normal vision right eye: Secondary | ICD-10-CM | POA: Diagnosis not present

## 2017-12-23 DIAGNOSIS — Z915 Personal history of self-harm: Secondary | ICD-10-CM | POA: Diagnosis not present

## 2017-12-23 DIAGNOSIS — R531 Weakness: Secondary | ICD-10-CM

## 2017-12-23 DIAGNOSIS — F39 Unspecified mood [affective] disorder: Secondary | ICD-10-CM | POA: Diagnosis not present

## 2017-12-23 DIAGNOSIS — F1994 Other psychoactive substance use, unspecified with psychoactive substance-induced mood disorder: Secondary | ICD-10-CM

## 2017-12-23 DIAGNOSIS — Z8249 Family history of ischemic heart disease and other diseases of the circulatory system: Secondary | ICD-10-CM

## 2017-12-23 DIAGNOSIS — Z560 Unemployment, unspecified: Secondary | ICD-10-CM

## 2017-12-23 DIAGNOSIS — Z818 Family history of other mental and behavioral disorders: Secondary | ICD-10-CM

## 2017-12-23 DIAGNOSIS — R443 Hallucinations, unspecified: Secondary | ICD-10-CM | POA: Diagnosis not present

## 2017-12-23 DIAGNOSIS — R5381 Other malaise: Secondary | ICD-10-CM | POA: Diagnosis not present

## 2017-12-23 LAB — COMPREHENSIVE METABOLIC PANEL
ALBUMIN: 4.6 g/dL (ref 3.5–5.0)
ALK PHOS: 116 U/L (ref 38–126)
ALT: 25 U/L (ref 17–63)
ANION GAP: 7 (ref 5–15)
AST: 26 U/L (ref 15–41)
BILIRUBIN TOTAL: 0.4 mg/dL (ref 0.3–1.2)
BUN: 18 mg/dL (ref 6–20)
CALCIUM: 9.7 mg/dL (ref 8.9–10.3)
CO2: 26 mmol/L (ref 22–32)
Chloride: 104 mmol/L (ref 101–111)
Creatinine, Ser: 0.93 mg/dL (ref 0.61–1.24)
GFR calc Af Amer: >60 mL/min (ref >60)
GFR calc non Af Amer: >60 mL/min (ref >60)
GLUCOSE: 116 mg/dL — AB (ref 65–99)
Potassium: 4.2 mmol/L (ref 3.5–5.1)
Sodium: 137 mmol/L (ref 135–145)
TOTAL PROTEIN: 7.5 g/dL (ref 6.5–8.1)

## 2017-12-23 LAB — CBC
HEMATOCRIT: 41.3 % (ref 39.0–52.0)
Hemoglobin: 14.9 g/dL (ref 13.0–17.0)
MCH: 34.1 pg — AB (ref 26.0–34.0)
MCHC: 36.1 g/dL — AB (ref 30.0–36.0)
MCV: 94.5 fL (ref 78.0–100.0)
Platelets: 198 10*3/uL (ref 150–400)
RBC: 4.37 MIL/uL (ref 4.22–5.81)
RDW: 12.1 % (ref 11.5–15.5)
WBC: 6.3 10*3/uL (ref 4.0–10.5)

## 2017-12-23 LAB — ETHANOL: Alcohol, Ethyl (B): <10 mg/dL (ref <10)

## 2017-12-23 LAB — ACETAMINOPHEN LEVEL

## 2017-12-23 LAB — RAPID URINE DRUG SCREEN, HOSP PERFORMED
Amphetamines: NOT DETECTED
BARBITURATES: NOT DETECTED
Benzodiazepines: POSITIVE — AB
COCAINE: NOT DETECTED
Opiates: NOT DETECTED
Tetrahydrocannabinol: NOT DETECTED

## 2017-12-23 LAB — SALICYLATE LEVEL: Salicylate Lvl: <7 mg/dL (ref 2.8–30.0)

## 2017-12-23 LAB — COMPLIANCE DRUG ANALYSIS, UR

## 2017-12-23 MED ORDER — TRAZODONE HCL 50 MG PO TABS
50.0000 mg | ORAL_TABLET | Freq: Every evening | ORAL | Status: DC | PRN
  Administered 2017-12-23 – 2017-12-25 (×2): 50 mg via ORAL
  Filled 2017-12-23 (×7): qty 1

## 2017-12-23 MED ORDER — GABAPENTIN 300 MG PO CAPS
300.0000 mg | ORAL_CAPSULE | Freq: Two times a day (BID) | ORAL | Status: DC
  Administered 2017-12-23 (×2): 300 mg via ORAL
  Filled 2017-12-23 (×8): qty 1

## 2017-12-23 MED ORDER — DIPHENHYDRAMINE HCL 25 MG PO CAPS
50.0000 mg | ORAL_CAPSULE | Freq: Four times a day (QID) | ORAL | Status: DC | PRN
  Administered 2017-12-25 (×2): 50 mg via ORAL
  Filled 2017-12-23 (×2): qty 2

## 2017-12-23 MED ORDER — TIZANIDINE HCL 2 MG PO TABS
4.0000 mg | ORAL_TABLET | Freq: Three times a day (TID) | ORAL | Status: DC | PRN
  Administered 2017-12-23: 4 mg via ORAL
  Filled 2017-12-23: qty 2

## 2017-12-23 MED ORDER — DIPHENHYDRAMINE HCL 50 MG/ML IJ SOLN
50.0000 mg | Freq: Four times a day (QID) | INTRAMUSCULAR | Status: DC | PRN
Start: 2017-12-23 — End: 2017-12-26
  Administered 2017-12-23: 50 mg via INTRAMUSCULAR
  Filled 2017-12-23: qty 1

## 2017-12-23 MED ORDER — CHLORDIAZEPOXIDE HCL 25 MG PO CAPS: 25.0000 mg | ORAL_CAPSULE | Freq: Four times a day (QID) | ORAL | Status: DC | PRN

## 2017-12-23 MED ORDER — MAGNESIUM HYDROXIDE 400 MG/5ML PO SUSP
30.0000 mL | Freq: Every day | ORAL | Status: DC | PRN
  Filled 2017-12-23: qty 30

## 2017-12-23 MED ORDER — ROSUVASTATIN CALCIUM 10 MG PO TABS
10.0000 mg | ORAL_TABLET | Freq: Every day | ORAL | Status: DC
  Administered 2017-12-23 – 2017-12-24 (×2): 10 mg via ORAL
  Filled 2017-12-23 (×6): qty 1

## 2017-12-23 MED ORDER — HALOPERIDOL 5 MG PO TABS
ORAL_TABLET | ORAL | Status: AC
  Filled 2017-12-23: qty 1

## 2017-12-23 MED ORDER — ALPRAZOLAM 1 MG PO TABS
1.0000 mg | ORAL_TABLET | Freq: Every day | ORAL | Status: DC
  Administered 2017-12-23: 1 mg via ORAL
  Filled 2017-12-23: qty 1

## 2017-12-23 MED ORDER — HALOPERIDOL LACTATE 5 MG/ML IJ SOLN
5.0000 mg | Freq: Four times a day (QID) | INTRAMUSCULAR | Status: DC | PRN
  Administered 2017-12-23: 5 mg via INTRAMUSCULAR
  Filled 2017-12-23: qty 1

## 2017-12-23 MED ORDER — DULOXETINE HCL 20 MG PO CPEP
20.0000 mg | ORAL_CAPSULE | Freq: Two times a day (BID) | ORAL | Status: DC
  Administered 2017-12-23 (×2): 20 mg via ORAL
  Filled 2017-12-23 (×7): qty 1

## 2017-12-23 MED ORDER — ALUM & MAG HYDROXIDE-SIMETH 200-200-20 MG/5ML PO SUSP: 30.0000 mL | ORAL | Status: DC | PRN

## 2017-12-23 MED ORDER — DIPHENHYDRAMINE HCL 25 MG PO CAPS
ORAL_CAPSULE | ORAL | Status: AC
  Filled 2017-12-23: qty 2

## 2017-12-23 MED ORDER — ACETAMINOPHEN 325 MG PO TABS
650.0000 mg | ORAL_TABLET | Freq: Four times a day (QID) | ORAL | Status: DC | PRN
  Administered 2017-12-24: 650 mg via ORAL
  Filled 2017-12-23: qty 2

## 2017-12-23 MED ORDER — METOPROLOL SUCCINATE ER 100 MG PO TB24
100.0000 mg | ORAL_TABLET | Freq: Every day | ORAL | Status: DC
  Administered 2017-12-23 – 2017-12-24 (×2): 100 mg via ORAL
  Filled 2017-12-23 (×6): qty 1

## 2017-12-23 MED ORDER — NICOTINE 21 MG/24HR TD PT24
21.0000 mg | MEDICATED_PATCH | Freq: Every day | TRANSDERMAL | Status: DC
  Administered 2017-12-23 – 2017-12-24 (×2): 21 mg via TRANSDERMAL
  Filled 2017-12-23 (×5): qty 1

## 2017-12-23 MED ORDER — HYDROXYZINE HCL 25 MG PO TABS: 25.0000 mg | ORAL_TABLET | Freq: Four times a day (QID) | ORAL | Status: DC | PRN

## 2017-12-23 MED ORDER — HALOPERIDOL 5 MG PO TABS: 5.0000 mg | ORAL_TABLET | Freq: Four times a day (QID) | ORAL | Status: DC | PRN

## 2017-12-23 NOTE — Progress Notes (Signed)
Pt did not attend orientation group.  

## 2017-12-23 NOTE — [facility] Group Notes (Signed)
[facility] Group Notes:  (Nursing/MHT/Case Management/Adjunct)  Date:  12/23/2017  Time:  5:28 PM  Type of Therapy:  Psychoeducational Skills  Participation Level:  Active  Participation Quality:  Appropriate  Affect:  Appropriate and Tearful  Cognitive:  Alert  Insight:  Appropriate  Engagement in Group:  Engaged  Modes of Intervention:  Discussion and Education  Summary of Progress/Problems:  Pt Participated in group. During this group staff discussed recovery and how to prevent relapse. Pt's were given a set back prevention plan worksheet to complete to help them through recovery. Pt became tearful during group when sharing why he is in the hospital.   Karren CobbleFizah G Ejay Lashley 12/23/2017, 5:28 PM

## 2017-12-23 NOTE — [facility] Suicide Risk Assessment (Signed)
[facility] INPATIENT:  Family/Significant Other Suicide Prevention Education  Suicide Prevention Education:  Education Completed; Raymond Melton (wife, [phone]) has been identified by the patient as the family member/significant other with whom the patient will be residing, and identified as the person(s) who will aid the patient in the event of a mental health crisis (suicidal ideations/suicide attempt).  With written consent from the patient, the family member/significant other has been provided the following suicide prevention education, prior to the and/or following the discharge of the patient.  The suicide prevention education provided includes the following:  Suicide risk factors  Suicide prevention and interventions  National Suicide Hotline telephone number  [facility] assessment telephone number  [facility] Emergency Assistance 911  [facility] and/or Residential Mobile Crisis Unit telephone number  Request made of family/significant other to:  Remove weapons (e.g., guns, rifles, knives), all items previously/currently identified as safety concern.    Remove drugs/medications (over-the-counter, prescriptions, illicit drugs), all items previously/currently identified as a safety concern.  The family member/significant other verbalizes understanding of the suicide prevention education information provided.  The family member/significant other agrees to remove the items of safety concern listed above.  Raymond Melton 12/23/2017, 12:31 PM

## 2017-12-23 NOTE — [facility] Suicide Risk Assessment (Signed)
[facility] Admission Suicide Risk Assessment   Nursing information obtained from:    Demographic factors:    Current Mental Status:    Loss Factors:    Historical Factors:    Risk Reduction Factors:     Total Time spent with patient: 45 minutes Principal Problem: Bipolar disorder, unspecified (HCC) Diagnosis:   Patient Active Problem List   Diagnosis Date Noted  . Bipolar disorder, unspecified (HCC) [F31.9] 12/23/2017  . Altered mental status [R41.82] 12/17/2017  . Opioid use disorder, severe, dependence (HCC) [F11.20] 11/26/2016  . History of neck surgery [Z98.890] 11/26/2016  . Hx of hepatitis C [Z86.19] 11/26/2016  . Hypertension [I10] 11/26/2016  . Diabetes (HCC) [E11.9] 11/26/2016  . MDD (major depressive disorder), recurrent severe, without psychosis (HCC) [F33.2] 11/25/2016   Subjective Data:  62 y.o Caucasian male, separated, lives alone, unemployed. Background history of SUD and mood disorder. Presented to the ER unaccompanied. Says his wife encouraged him to seek help. Reported to be very irritable lately. He has been very argumentative with his wife. He has been expressing increasing depression. He had expressed suicidal thoughts. Patient overdosed on his heart medications a month ago. Routine labs is within normal limits. Toxicology is negative. UDS is positive for Bnezodiazepines. BAL<10 mg/dl. Recent out patient provider note indicates he has been having intermittent confusion. He has been testing positive for benzodiazepines despite not having this prescribed anymore. Patient is very irritable and impulsive. He has poor insight and limited judgement. He has multiple psychosocial stressors. He is not fully in touch with reality. He remains a risk to self. He needs inpatient stabilization.   Continued Clinical Symptoms:    The "Alcohol Use Disorders Identification Test", Guidelines for Use in Primary Care, Second Edition.  World Science writer [place]). Score between 0-7:  no or  low risk or alcohol related problems. Score between 8-15:  moderate risk of alcohol related problems. Score between 16-19:  high risk of alcohol related problems. Score 20 or above:  warrants further diagnostic evaluation for alcohol dependence and treatment.   CLINICAL FACTORS:   Bipolar Disorder:   Mixed State   Musculoskeletal: Strength & Muscle Tone: within normal limits Gait & Station: normal Patient leans: N/A  Psychiatric Specialty Exam: Physical Exam  ROS  Blood pressure (!) 150/86, pulse 67, temperature 97.8 F (36.6 C), temperature source Oral, resp. rate 16, height 5' 11.65" (1.82 m), weight 72 kg (158 lb 11.7 oz).Body mass index is 21.74 kg/m.  General Appearance: As in H&P'  Eye Contact:    Speech:    Volume:    Mood:    Affect:    Thought Process:    Orientation:    Thought Content:    Suicidal Thoughts:  As in H&P  Homicidal Thoughts:    Memory:    Judgement:    Insight:    Psychomotor Activity:    Concentration:    Recall:    Fund of Knowledge:    Language:    Akathisia:    Handed:    AIMS (if indicated):     Assets:    ADL's:    Cognition:  As in H&P  Sleep:         COGNITIVE FEATURES THAT CONTRIBUTE TO RISK:  Closed-mindedness and Loss of executive function    SUICIDE RISK:   Severe:  Frequent, intense, and enduring suicidal ideation, specific plan, no subjective intent, but some objective markers of intent (i.e., choice of lethal method), the method is accessible, some limited preparatory  behavior, evidence of impaired self-control, severe dysphoria/symptomatology, multiple risk factors present, and few if any protective factors, particularly a lack of social support.  PLAN OF CARE:  As in H&P  I certify that inpatient services furnished can reasonably be expected to improve the patient's condition.   Georgiann CockerVincent A Jovi Zavadil, MD 12/23/2017, 3:39 PM
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
## 2017-12-23 NOTE — [facility] Group Notes (Signed)
LCSW Group Therapy Note 12/23/2017 4:00 PM  Type of Therapy/Topic: Group Therapy: Feelings about Diagnosis  Participation Level: Minimal   Description of Group:  This group will allow patients to explore their thoughts and feelings about diagnoses they have received. Patients will be guided to explore their level of understanding and acceptance of these diagnoses. Facilitator will encourage patients to process their thoughts and feelings about the reactions of others to their diagnosis and will guide patients in identifying ways to discuss their diagnosis with significant others in their lives. This group will be process-oriented, with patients participating in exploration of their own experiences, giving and receiving support, and processing challenge from other group members.  Therapeutic Goals: 1. Patient will demonstrate understanding of diagnosis as evidenced by identifying two or more symptoms of the disorder 2. Patient will be able to express two feelings regarding the diagnosis 3. Patient will demonstrate their ability to communicate their needs through discussion and/or role play  Summary of Patient Progress:   Patient attended the group late, and at times would ramble off the group's discussion topic. Patient then excused himself from the group to ask his nurse for medications. Patient never returned.   Therapeutic Modalities:  Cognitive Behavioral Therapy Brief Therapy Feelings Identification    Shirline Kendle Catalina AntiguaWilliams LCSWA Clinical Social Worker

## 2017-12-23 NOTE — Plan of Care (Signed)
Nurse discussed anxiety and coping skills with patient. 

## 2017-12-23 NOTE — Progress Notes (Signed)
Patient stated he needs stronger pain/anxiety medication.  Vistaril and tylenol do not help him at all.  Patient needs his nerve stimulator.  This is the wrong place for him.  Patient has slept most of the morning.  Refused groups.  Emotional support and encouragement given patient.  No signs/symptoms of pain/distress noted on patient's face/body movements.  Safety maintained with 15 minute checks. PRN's have been offered to patient today and patient continues to refuse these medications.

## 2017-12-23 NOTE — Tx Team (Addendum)
Initial Treatment Plan 12/23/2017 3:56 AM Raymond PrimesMichael James Melton ZOX:[id number]    PATIENT STRESSORS: Health problems Marital or family conflict   PATIENT STRENGTHS: Average or above average intelligence Capable of independent living General fund of knowledge Motivation for treatment/growth   PATIENT IDENTIFIED PROBLEMS:   depression  anxiety  Suicidal ideation  Health problems  "get into therapy and get this crazy thoughts gone"           DISCHARGE CRITERIA:  Improved stabilization in mood, thinking, and/or behavior Motivation to continue treatment in a less acute level of care Need for constant or close observation no longer present Reduction of life-threatening or endangering symptoms to within safe limits Verbal commitment to aftercare and medication compliance  PRELIMINARY DISCHARGE PLAN: Attend aftercare/continuing care group Participate in family therapy Return to previous living arrangement  PATIENT/FAMILY INVOLVEMENT: This treatment plan has been presented to and reviewed with the patient, Orpah MelterMichael James Turpin, The patient and family have been given the opportunity to ask questions and make suggestions.  JEHU-APPIAH, Salley ScarletLINDA K, RN 12/23/2017, 3:56 AM

## 2017-12-23 NOTE — Plan of Care (Signed)
  Safety: Periods of time without injury will increase 12/23/2017 2235 - Progressing by Delos HaringPhillips, Haig A, RN Note Pt safe on the unit at this time

## 2017-12-23 NOTE — Progress Notes (Signed)
D: Pt came to the nursing station wanting to leave and saying he was going to go this evening. Pt was informed the doctor was the only one that can D/C him. Pt walked away very unsteady almost falling. Pt will be monitored closely to see if he needs to be CO or 1:1 .  A: Pt was offered support and encouragement. Pt was given scheduled medications. Pt was encourage to attend groups. Q 15 minute checks were done for safety.   R:Pt sleep in room safety maintained on unit.
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
## 2017-12-23 NOTE — [facility] Group Notes (Signed)
[facility] Group Notes:  (Nursing/MHT/Case Management/Adjunct)  Date:  12/23/2017  Time:  5:30 PM  Type of Therapy:  Psychoeducational Skills  Participation Level:   Patient did not attend group.  Participation Quality:    Affect:    Cognitive:    Insight:    Engagement in Group:    Modes of Intervention:    Summary of Progress/Problems:  Earline MayotteKnight, Quantez Schnyder Shephard 12/23/2017, 5:30 PM

## 2017-12-23 NOTE — H&P (Signed)
Psychiatric Admission Assessment Adult  Patient Identification: Raymond Melton MRN:  [id number] Date of Evaluation:  12/23/2017 Chief Complaint:  Worsening depression with suicidal thoughts Principal Diagnosis: Bipolar Disorder                                         Mood disorder unspecified.                                          SUD Diagnosis:   Patient Active Problem List   Diagnosis Date Noted  . MDD (major depressive disorder), recurrent episode, severe ([place]) [F33.2] 12/23/2017  . Altered mental status [R41.82] 12/17/2017  . Opioid use disorder, severe, dependence ([place]) [F11.20] 11/26/2016  . History of neck surgery [Z98.890] 11/26/2016  . Hx of hepatitis C [Z86.19] 11/26/2016  . Hypertension [I10] 11/26/2016  . Diabetes ([place]) [E11.9] 11/26/2016  . MDD (major depressive disorder), recurrent severe, without psychosis ([place]) [F33.2] 11/25/2016   History of Present Illness:  62 y.o Caucasian male, separated, lives alone, unemployed. Background history of SUD and mood disorder. Presented to the ER unaccompanied. Says his wife encouraged him to seek help. Reported to be very irritable lately. He has been very argumentative with his wife. He has been expressing increasing depression. He had expressed suicidal thoughts. Patient overdosed on his heart medications a month ago. Routine labs is within normal limits. Toxicology is negative. UDS is positive for Bnezodiazepines. BAL<10 mg/dl. Recent out patient provider note indicates he has been having intermittent confusion. He has been testing positive for benzodiazepines despite not having this prescribed anymore.  At interview, patient reports that he has been losing track of time. Says he is blacking out. He has been doing things he cannot remember. 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 Patient says he has not had alcohol in over five years. Says he feels there is no potng going on like this " my wife put me in jail for three days ,,, she took everything". Patient says they have been arguing a lot. Everyone feels my mood is all over the place. Patient denies any homicidal thoughts. He does not have any weapon but says he can assess weapons easily.   Total Time spent with patient: 1 hour  Past Psychiatric History: This is his second hospitalization here. Says he has been treated at [facility] in the past. Very vague on why he was treated. Dismissed need for antidepressants or mood stabilizers. Says he has been tried on everything and they do not work. Patient very focused on being prescribed Xanax. Feels that is what has helped him in the past. Unable to gather further information as he is stuck on being give benzodiazepines.   Is the patient at risk to self? Yes.    Has the patient been a risk to self in the past 6 months? Yes.    Has the patient been a risk to self  within the distant past? Yes.    Is the patient a risk to others? No.  Has the patient been a risk to others in the past 6 months? don't know   Has the patient been a risk to others within the distant past? Don't know   Prior Inpatient Therapy:   Prior Outpatient Therapy:    Alcohol Screening: Patient refused Alcohol Screening Tool: (NO) 1. How often do you have a drink containing alcohol?: Never 2. How many drinks containing alcohol do you have on a typical day when you are drinking?: 1 or 2 3. How often do you have six or more drinks on one occasion?: Never AUDIT-C Score: 0 Intervention/Follow-up: AUDIT Score <7 follow-up not indicated Substance Abuse History in the last 12 months:  Yes.   Consequences of Substance Abuse: Blackouts:   and  confusion Previous Psychotropic Medications: Yes  Psychological Evaluations: Yes  Past Medical History:  Past Medical History:  Diagnosis Date  . Alcohol abuse   . Altered mental status 12/17/2017  . Chronic pain   . Diabetes mellitus without complication ([place])   . Hepatitis C   . Hypertension   . Immune deficiency disorder ([place])   . Kidney stones   . TB lung, latent     Past Surgical History:  Procedure Laterality Date  . ABDOMINAL SURGERY    . ANKLE SURGERY    . CHOLECYSTECTOMY    . ELBOW SURGERY    . EYE SURGERY    . HERNIA REPAIR    . WRIST SURGERY     Family History:  Family History  Problem Relation Age of Onset  . Depression Mother   . Hypertension Mother   . Leukemia Mother   . Stroke Father   . Hypertension Father   . Cancer Sister   . Hypertension Sister   . Cancer Brother        bladder  . Hypertension Brother    Family Psychiatric  History: Unable to assess at this time.  Tobacco Screening: Have you used any form of tobacco in the last 30 days? (Cigarettes, Smokeless Tobacco, Cigars, and/or Pipes): Yes Tobacco use, Select all that apply: 5 or more cigarettes per day Are you interested in Tobacco Cessation Medications?: No, patient refused Counseled patient on smoking cessation including recognizing danger situations, developing coping skills and basic information about quitting provided: Refused/Declined practical counseling Social History:  Social History   Substance and Sexual Activity  Alcohol Use Yes   Comment: daily     Social History   Substance and Sexual Activity  Drug Use No    Additional Social History:        Unable to assess at this time.  Allergies:   Allergies  Allergen Reactions  . Contrast Media [Iodinated Diagnostic Agents] Anaphylaxis and Swelling  . Ioxaglate Anaphylaxis and Swelling  . Metrizamide Anaphylaxis and Swelling  . Other Anaphylaxis, Shortness Of Breath and Swelling    Iv dye  . Codeine Itching    Other  reaction(s): Other (See Comments) restless  . Iohexol Other (See Comments)  . Latex   . Lisinopril   . Tuberculin     Other reaction(s): Other (See Comments) Severe skin reaction  . Tuberculin Tests Other (See Comments)    Severe skin reaction  . Valsartan   . Zolpidem Other (See Comments)    confusion   Lab Results:  Results for orders placed or performed during the hospital encounter of 12/22/17 (from the past 48 hour(s))  Comprehensive metabolic panel     Status: Abnormal   Collection Time: 12/22/17 10:48 PM  Result Value Ref Range   Sodium 137 135 - 145 mmol/L   Potassium 4.2 3.5 - 5.1 mmol/L   Chloride 104 101 - 111 mmol/L   CO2 26 22 - 32 mmol/L   Glucose, Bld 116 (H) 65 - 99 mg/dL   BUN 18 6 - 20 mg/dL   Creatinine, Ser 0.93 0.61 - 1.24 mg/dL   Calcium 9.7 8.9 - 10.3 mg/dL   Total Protein 7.5 6.5 - 8.1 g/dL   Albumin 4.6 3.5 - 5.0 g/dL   AST 26 15 - 41 U/L   ALT 25 17 - 63 U/L   Alkaline Phosphatase 116 38 - 126 U/L   Total Bilirubin 0.4 0.3 - 1.2 mg/dL   GFR calc non Af Amer >60 >60 mL/min   GFR calc Af Amer >60 >60 mL/min    Comment: (NOTE) The eGFR has been calculated using the CKD EPI equation. This calculation has not been validated in all clinical situations. eGFR's persistently <60 mL/min signify possible Chronic Kidney Disease.    Anion gap 7 5 - 15  Ethanol     Status: None   Collection Time: 12/22/17 10:48 PM  Result Value Ref Range   Alcohol, Ethyl (B) <10 <10 mg/dL    Comment:        LOWEST DETECTABLE LIMIT FOR SERUM ALCOHOL IS 10 mg/dL FOR MEDICAL PURPOSES ONLY   Salicylate level     Status: None   Collection Time: 12/22/17 10:48 PM  Result Value Ref Range   Salicylate Lvl <7.8 2.8 - 30.0 mg/dL  Acetaminophen level     Status: Abnormal   Collection Time: 12/22/17 10:48 PM  Result Value Ref Range   Acetaminophen (Tylenol), Serum <10 (L) 10 - 30 ug/mL    Comment:        THERAPEUTIC CONCENTRATIONS VARY SIGNIFICANTLY. A RANGE OF  10-30 ug/mL MAY BE AN EFFECTIVE CONCENTRATION FOR MANY PATIENTS. HOWEVER, SOME ARE BEST TREATED AT CONCENTRATIONS OUTSIDE THIS RANGE. ACETAMINOPHEN CONCENTRATIONS >150 ug/mL AT 4 HOURS AFTER INGESTION AND >50 ug/mL AT 12 HOURS AFTER INGESTION ARE OFTEN ASSOCIATED WITH TOXIC REACTIONS.   cbc     Status: Abnormal   Collection Time: 12/22/17 10:48 PM  Result Value Ref Range   WBC 6.3 4.0 - 10.5 K/uL   RBC 4.37 4.22 - 5.81 MIL/uL   Hemoglobin 14.9 13.0 - 17.0 g/dL   HCT 41.3 39.0 - 52.0 %   MCV 94.5 78.0 - 100.0 fL   MCH 34.1 (H) 26.0 - 34.0 pg   MCHC 36.1 (H) 30.0 - 36.0 g/dL   RDW 12.1 11.5 - 15.5 %   Platelets 198 150 - 400 K/uL  Rapid urine drug screen (hospital performed)     Status: Abnormal   Collection Time: 12/22/17 11:40 PM  Result Value Ref Range   Opiates NONE DETECTED NONE DETECTED   Cocaine NONE DETECTED NONE DETECTED   Benzodiazepines POSITIVE (A) NONE DETECTED   Amphetamines NONE DETECTED NONE DETECTED   Tetrahydrocannabinol NONE DETECTED NONE DETECTED   Barbiturates NONE DETECTED NONE DETECTED    Comment: (NOTE) DRUG SCREEN FOR MEDICAL PURPOSES ONLY.  IF CONFIRMATION IS NEEDED FOR ANY PURPOSE, NOTIFY LAB WITHIN 5 DAYS. LOWEST DETECTABLE LIMITS FOR URINE DRUG SCREEN Drug Class                     Cutoff (ng/mL) Amphetamine and metabolites  1000 Barbiturate and metabolites    200 Benzodiazepine                 803 Tricyclics and metabolites     300 Opiates and metabolites        300 Cocaine and metabolites        300 THC                            50     Blood Alcohol level:  Lab Results  Component Value Date   ETH <10 12/22/2017   ETH <11 21/22/4825    Metabolic Disorder Labs:  Lab Results  Component Value Date   HGBA1C 6.0 (H) 11/27/2016   MPG 126 11/27/2016   No results found for: PROLACTIN No results found for: CHOL, TRIG, HDL, CHOLHDL, VLDL, LDLCALC  Current Medications: Current Facility-Administered Medications  Medication Dose  Route Frequency Provider Last Rate Last Dose  . acetaminophen (TYLENOL) tablet 650 mg  650 mg Oral Q6H PRN Laverle Hobby, PA-C      . ALPRAZolam Duanne Moron) tablet 1 mg  1 mg Oral QHS Patriciaann Clan E, PA-C   1 mg at 12/23/17 0342  . alum & mag hydroxide-simeth (MAALOX/MYLANTA) 200-200-20 MG/5ML suspension 30 mL  30 mL Oral Q4H PRN Laverle Hobby, PA-C      . DULoxetine (CYMBALTA) DR capsule 20 mg  20 mg Oral BID Laverle Hobby, PA-C   20 mg at 12/23/17 0836  . gabapentin (NEURONTIN) capsule 300 mg  300 mg Oral BID Laverle Hobby, PA-C   300 mg at 12/23/17 0037  . hydrOXYzine (ATARAX/VISTARIL) tablet 25 mg  25 mg Oral Q6H PRN Patriciaann Clan E, PA-C      . magnesium hydroxide (MILK OF MAGNESIA) suspension 30 mL  30 mL Oral Daily PRN Patriciaann Clan E, PA-C      . metoprolol succinate (TOPROL-XL) 24 hr tablet 100 mg  100 mg Oral Daily Patriciaann Clan E, PA-C   100 mg at 12/23/17 0837  . nicotine (NICODERM CQ - dosed in mg/24 hours) patch 21 mg  21 mg Transdermal Daily Cobos, Myer Peer, MD   21 mg at 12/23/17 0946  . rosuvastatin (CRESTOR) tablet 10 mg  10 mg Oral Daily Laverle Hobby, PA-C   10 mg at 12/23/17 0488  . tiZANidine (ZANAFLEX) tablet 4 mg  4 mg Oral Q8H PRN Laverle Hobby, PA-C   4 mg at 12/23/17 0951  . traZODone (DESYREL) tablet 50 mg  50 mg Oral QHS,MR X 1 Simon, Spencer E, PA-C       PTA Medications: Medications Prior to Admission  Medication Sig Dispense Refill Last Dose  . ALPRAZolam (XANAX) 1 MG tablet Take 1 mg by mouth at bedtime.   12/21/2017 at Unknown time  . ergocalciferol (VITAMIN D2) 50000 units capsule Take 50,000 Units by mouth 2 (two) times a week.   Past Week at Unknown time  . metoprolol succinate (TOPROL-XL) 100 MG 24 hr tablet Take 1 tablet (100 mg total) by mouth daily. Take with or immediately following a meal: For high blood pressure   12/22/2017 at 1430  . promethazine (PHENERGAN) 25 MG tablet Take 25 mg by mouth every 12 (twelve) hours as needed for  nausea or vomiting.   Past Week at Unknown time  . rosuvastatin (CRESTOR) 10 MG tablet Take 10 mg by mouth daily.   12/22/2017 at Unknown time  . tiZANidine (  ZANAFLEX) 4 MG tablet Take 4 mg by mouth every 8 (eight) hours as needed for muscle spasms.   12/22/2017 at Unknown time    Musculoskeletal: Strength & Muscle Tone: within normal limits Gait & Station: normal Patient leans: N/A  Psychiatric Specialty Exam: Physical Exam  Constitutional: He appears well-developed and well-nourished.  HENT:  Head: Normocephalic and atraumatic.  Respiratory: Effort normal.  Neurological: He is alert.  Psychiatric:  As above    ROS  Blood pressure (!) 150/86, pulse 67, temperature 97.8 F (36.6 C), temperature source Oral, resp. rate 16, height 5' 11.65" (1.82 m), weight 72 kg (158 lb 11.7 oz).Body mass index is 21.74 kg/m.  General Appearance: casually dressed. Not sweaty, irritable and intrusive.  Eye Contact:  Fair  Speech:  Clear and Coherent and Normal Rate  Volume:  Normal  Mood:  Dysphoric and Irritable  Affect:  Congruent  Thought Process:  Linear  Orientation:  Oriented to person and place. Unable to assess any further.   Thought Content:  illogically focused getting benzodiazepines. Unable to explore any further.  Suicidal Thoughts:  Yes.  with intent/plan  Homicidal Thoughts:  No  Memory:  Unable to assess at this time.   Judgement:  Poor  Insight:  Lacking  Psychomotor Activity:  Increased  Concentration:  Distractible   Recall:  Unable to assess at this time.   Fund of Knowledge:  Poor  Language:  Fair  Akathisia:  Negative  Handed:    AIMS (if indicated):     Assets:  Housing  ADL's:  Fair  Cognition:  Alert. Unable to assess any further.  Sleep:       Treatment Plan Summary: Patient is presenting with a lot of irritability and impulsivity. He is dysphoric and has been expressing suicidal thoughts. He recently attempted suicide. He has limited support and lives alone.  He is describing PTSD symptoms that is not being treated. He has some psychotic features which could be related to effects underlying mania. I discussed evidence based treatment for his illness. Patient flatly refused every option except for Alprazolam. We plan to use emergency medications at this point and gather more information from his wife.  Psychiatric: Mood Disorder ,,,, Bipolar disorder vs SIMD PTSD SUD  Medical: HTN Blindness left eye HCV Chronic pain History of AMS  Psychosocial:  Unemployed Limited support Medical issues Relationship issues  PLAN: 1. PRN Haldol and Benadryl to manage agitation 2. Would continue to encourage use of mood stabilizers and Prazosin 3. Encourage unit groups and therapeutic activities 4. Monitor mood, behavior and interaction with peers 5. SW would gather collateral from his family and coordinate aftercare   Observation Level/Precautions:  15 minute checks, escape precaution   Laboratory:    Psychotherapy:    Medications:    Consultations:    Discharge Concerns:    Estimated LOS:  Other:     Physician Treatment Plan for Primary Diagnosis: <principal problem not specified> Long Term Goal(s): Improvement in symptoms so as ready for discharge  Short Term Goals: Ability to identify changes in lifestyle to reduce recurrence of condition will improve, Ability to verbalize feelings will improve, Ability to disclose and discuss suicidal ideas, Ability to demonstrate self-control will improve, Ability to identify and develop effective coping behaviors will improve, Ability to maintain clinical measurements within normal limits will improve, Compliance with prescribed medications will improve and Ability to identify triggers associated with substance abuse/mental health issues will improve  Physician Treatment Plan for Secondary Diagnosis: Active  Problems:   MDD (major depressive disorder), recurrent episode, severe ([place])  Long Term Goal(s):  Improvement in symptoms so as ready for discharge  Short Term Goals: Ability to identify changes in lifestyle to reduce recurrence of condition will improve, Ability to verbalize feelings will improve, Ability to disclose and discuss suicidal ideas, Ability to demonstrate self-control will improve, Ability to identify and develop effective coping behaviors will improve, Ability to maintain clinical measurements within normal limits will improve, Compliance with prescribed medications will improve and Ability to identify triggers associated with substance abuse/mental health issues will improve  I certify that inpatient services furnished can reasonably be expected to improve the patient's condition.    [place], MD 1/29/20192:55 PM

## 2017-12-24 DIAGNOSIS — R45851 Suicidal ideations: Secondary | ICD-10-CM

## 2017-12-24 DIAGNOSIS — F419 Anxiety disorder, unspecified: Secondary | ICD-10-CM

## 2017-12-24 DIAGNOSIS — R5381 Other malaise: Secondary | ICD-10-CM

## 2017-12-24 DIAGNOSIS — F13230 Sedative, hypnotic or anxiolytic dependence with withdrawal, uncomplicated: Secondary | ICD-10-CM

## 2017-12-24 DIAGNOSIS — R5383 Other fatigue: Secondary | ICD-10-CM

## 2017-12-24 DIAGNOSIS — R450 Nervousness: Secondary | ICD-10-CM

## 2017-12-24 DIAGNOSIS — F4310 Post-traumatic stress disorder, unspecified: Secondary | ICD-10-CM

## 2017-12-24 DIAGNOSIS — R443 Hallucinations, unspecified: Secondary | ICD-10-CM

## 2017-12-24 DIAGNOSIS — G4700 Insomnia, unspecified: Secondary | ICD-10-CM

## 2017-12-24 DIAGNOSIS — E785 Hyperlipidemia, unspecified: Secondary | ICD-10-CM

## 2017-12-24 MED ORDER — ARIPIPRAZOLE 10 MG PO TABS
10.0000 mg | ORAL_TABLET | Freq: Every day | ORAL | Status: DC
  Administered 2017-12-24: 10 mg via ORAL
  Filled 2017-12-24 (×5): qty 1

## 2017-12-24 MED ORDER — GABAPENTIN 300 MG PO CAPS
300.0000 mg | ORAL_CAPSULE | Freq: Three times a day (TID) | ORAL | Status: DC
  Administered 2017-12-24 – 2017-12-25 (×3): 300 mg via ORAL
  Filled 2017-12-24 (×9): qty 1

## 2017-12-24 MED ORDER — SERTRALINE HCL 50 MG PO TABS
50.0000 mg | ORAL_TABLET | Freq: Every day | ORAL | Status: DC
  Administered 2017-12-24: 50 mg via ORAL
  Filled 2017-12-24 (×5): qty 1

## 2017-12-24 NOTE — Plan of Care (Signed)
Pt has been in bed most of the day. He has not interacted with peers or participated in groups today.

## 2017-12-24 NOTE — Progress Notes (Signed)
Nursing 1:1 note D:Pt observed sleeping in bed with eyes closed. RR even and unlabored. No distress noted. Pt did get up for a little to eat but went back to sleep A: 1:1 observation continues for safety  R: pt remains safe

## 2017-12-24 NOTE — Tx Team (Addendum)
Interdisciplinary Treatment and Diagnostic Plan Update  12/24/2017 Time of Session: 10:35 AM  Raymond Melton MRN: [id number]  Principal Diagnosis: Bipolar disorder, unspecified ([place])  Secondary Diagnoses: Principal Problem:   Bipolar disorder, unspecified ([place])   Current Medications:  Current Facility-Administered Medications  Medication Dose Route Frequency Provider Last Rate Last Dose  . acetaminophen (TYLENOL) tablet 650 mg  650 mg Oral Q6H PRN Laverle Hobby, PA-C      . alum & mag hydroxide-simeth (MAALOX/MYLANTA) 200-200-20 MG/5ML suspension 30 mL  30 mL Oral Q4H PRN Patriciaann Clan E, PA-C      . chlordiazePOXIDE (LIBRIUM) capsule 25 mg  25 mg Oral QID PRN Izediuno, Laruth Bouchard, MD      . diphenhydrAMINE (BENADRYL) capsule 50 mg  50 mg Oral Q6H PRN Izediuno, Laruth Bouchard, MD       Or  . diphenhydrAMINE (BENADRYL) injection 50 mg  50 mg Intramuscular Q6H PRN Izediuno, Laruth Bouchard, MD   50 mg at 12/23/17 1536  . DULoxetine (CYMBALTA) DR capsule 20 mg  20 mg Oral BID Laverle Hobby, PA-C   20 mg at 12/23/17 2302  . gabapentin (NEURONTIN) capsule 300 mg  300 mg Oral BID Laverle Hobby, PA-C   300 mg at 12/23/17 2302  . haloperidol (HALDOL) tablet 5 mg  5 mg Oral Q6H PRN Izediuno, Laruth Bouchard, MD       Or  . haloperidol lactate (HALDOL) injection 5 mg  5 mg Intramuscular Q6H PRN Izediuno, Laruth Bouchard, MD   5 mg at 12/23/17 1535  . magnesium hydroxide (MILK OF MAGNESIA) suspension 30 mL  30 mL Oral Daily PRN Patriciaann Clan E, PA-C      . metoprolol succinate (TOPROL-XL) 24 hr tablet 100 mg  100 mg Oral Daily Patriciaann Clan E, PA-C   100 mg at 12/23/17 0837  . nicotine (NICODERM CQ - dosed in mg/24 hours) patch 21 mg  21 mg Transdermal Daily Cobos, Myer Peer, MD   21 mg at 12/23/17 0946  . rosuvastatin (CRESTOR) tablet 10 mg  10 mg Oral Daily Laverle Hobby, PA-C   10 mg at 12/23/17 5625  . tiZANidine (ZANAFLEX) tablet 4 mg  4 mg Oral Q8H PRN Laverle Hobby, PA-C   4 mg at 12/23/17  0951  . traZODone (DESYREL) tablet 50 mg  50 mg Oral QHS,MR X 1 Laverle Hobby, PA-C   50 mg at 12/23/17 2302    PTA Medications: Medications Prior to Admission  Medication Sig Dispense Refill Last Dose  . ALPRAZolam (XANAX) 1 MG tablet Take 1 mg by mouth at bedtime.   12/21/2017 at Unknown time  . ergocalciferol (VITAMIN D2) 50000 units capsule Take 50,000 Units by mouth 2 (two) times a week.   Past Week at Unknown time  . metoprolol succinate (TOPROL-XL) 100 MG 24 hr tablet Take 1 tablet (100 mg total) by mouth daily. Take with or immediately following a meal: For high blood pressure   12/22/2017 at 1430  . promethazine (PHENERGAN) 25 MG tablet Take 25 mg by mouth every 12 (twelve) hours as needed for nausea or vomiting.   Past Week at Unknown time  . rosuvastatin (CRESTOR) 10 MG tablet Take 10 mg by mouth daily.   12/22/2017 at Unknown time  . tiZANidine (ZANAFLEX) 4 MG tablet Take 4 mg by mouth every 8 (eight) hours as needed for muscle spasms.   12/22/2017 at Unknown time    Patient Stressors: Health problems Marital or family  conflict  Patient Strengths: Average or above average intelligence Capable of independent living General fund of knowledge Motivation for treatment/growth  Treatment Modalities: Medication Management, Group therapy, Case management,  1 to 1 session with clinician, Psychoeducation, Recreational therapy.   Physician Treatment Plan for Primary Diagnosis: Bipolar disorder, unspecified ([place]) Long Term Goal(s): Improvement in symptoms so as ready for discharge  Short Term Goals: Ability to identify changes in lifestyle to reduce recurrence of condition will improve Ability to verbalize feelings will improve Ability to disclose and discuss suicidal ideas Ability to demonstrate self-control will improve Ability to identify and develop effective coping behaviors will improve Ability to maintain clinical measurements within normal limits will improve Compliance  with prescribed medications will improve Ability to identify triggers associated with substance abuse/mental health issues will improve   Medication Management: Evaluate patient's response, side effects, and tolerance of medication regimen.  Therapeutic Interventions: 1 to 1 sessions, Unit Group sessions and Medication administration.  Evaluation of Outcomes: Progressing  Physician Treatment Plan for Secondary Diagnosis: Principal Problem:   Bipolar disorder, unspecified ([place])   Long Term Goal(s): Improvement in symptoms so as ready for discharge  Short Term Goals: Ability to identify changes in lifestyle to reduce recurrence of condition will improve Ability to verbalize feelings will improve Ability to disclose and discuss suicidal ideas Ability to demonstrate self-control will improve Ability to identify and develop effective coping behaviors will improve Ability to maintain clinical measurements within normal limits will improve Compliance with prescribed medications will improve Ability to identify triggers associated with substance abuse/mental health issues will improve   Medication Management: Evaluate patient's response, side effects, and tolerance of medication regimen.  Therapeutic Interventions: 1 to 1 sessions, Unit Group sessions and Medication administration.  Evaluation of Outcomes: Progressing   RN Treatment Plan for Primary Diagnosis: Bipolar disorder, unspecified ([place]) Long Term Goal(s): Knowledge of disease and therapeutic regimen to maintain health will improve  Short Term Goals: Ability to identify and develop effective coping behaviors will improve and Compliance with prescribed medications will improve  Medication Management: RN will administer medications as ordered by provider, will assess and evaluate patient's response and provide education to patient for prescribed medication. RN will report any adverse and/or side effects to prescribing  provider.  Therapeutic Interventions: 1 on 1 counseling sessions, Psychoeducation, Medication administration, Evaluate responses to treatment, Monitor vital signs and CBGs as ordered, Perform/monitor CIWA, COWS, AIMS and Fall Risk screenings as ordered, Perform wound care treatments as ordered.  Evaluation of Outcomes: Progressing   LCSW Treatment Plan for Primary Diagnosis: Bipolar disorder, unspecified ([place]) Long Term Goal(s): Safe transition to appropriate next level of care at discharge, Engage patient in therapeutic group addressing interpersonal concerns.  Short Term Goals: Engage patient in aftercare planning with referrals and resources  Therapeutic Interventions: Assess for all discharge needs, 1 to 1 time with Social worker, Explore available resources and support systems, Assess for adequacy in community support network, Educate family and significant other(s) on suicide prevention, Complete Psychosocial Assessment, Interpersonal group therapy.  Evaluation of Outcomes: Met  Return home, follow up outpt   Progress in Treatment: Attending groups: Yes Participating in groups: Yes Taking medication as prescribed: Yes Toleration medication: Yes, no side effects reported at this time Family/Significant other contact made: Yes Patient understands diagnosis: No  Limited insight Discussing patient identified problems/goals with staff: Yes Medical problems stabilized or resolved: Yes Denies suicidal/homicidal ideation: Yes Issues/concerns per patient self-inventory: None Other: N/A  New problem(s) identified: None identified at this  time.   New Short Term/Long Term Goal(s): "I need to get some good sleep, and I need help with my family life."   Discharge Plan or Barriers:   Reason for Continuation of Hospitalization:  Depression Hallucinations  Medication stabilization   Estimated Length of Stay: 12/31/17  Attendees: Patient: Raymond Melton 12/24/2017  10:35 AM  Physician:  Maris Berger, MD 12/24/2017  10:35 AM  Nursing: Desma Paganini, RN 12/24/2017  10:35 AM  RN Care Manager: Lars Pinks, RN 12/24/2017  10:35 AM  Social Worker: Ripley Fraise 12/24/2017  10:35 AM  Recreational Therapist: Winfield Cunas 12/24/2017  10:35 AM  Other: Norberto Sorenson 12/24/2017  10:35 AM  Other:  12/24/2017  10:35 AM    Scribe for Treatment Team:  Roque Lias LCSW 12/24/2017 10:35 AM

## 2017-12-24 NOTE — Progress Notes (Addendum)
D:Pt is in his room sleeping. He woke up for a few minutes and drank some Gatorade. Pt said that he was feeling week and turned over to sleep after drinking his Gatorade. Morning medications were held as pt is sedated. MD award.  A:Offered support, encouragement and 1:1 observation. R:Pt denies si and hi. Safety maintained on the unit.

## 2017-12-24 NOTE — Progress Notes (Signed)
Recreation Therapy Notes  Date: 12/24/17 Time: 1000 Location: 500 Hall Dayroom  Group Topic: Stress Management  Goal Area(s) Addresses:  Patient will verbalize importance of using healthy stress management.  Patient will identify positive emotions associated with healthy stress management.   Intervention: Calm app, script  Activity :  LRT introduced the stress management techniques of meditation and guided imagery.  LRT played a meditation from the Calm app that focused on taking inventory of any sensation they may be feeling in the body.  LRT then read a script to take patients on a mental vacation to the beach.  Education:  Stress Management, Discharge Planning.   Education Outcome: Acknowledges edcuation/In group clarification offered/Needs additional education  Clinical Observations/Feedback: Pt did not attend group.    Caroll RancherMarjette Cherity Blickenstaff, LRT/CTRS    Caroll RancherLindsay, Kanden Carey A 12/24/2017 12:22 PM

## 2017-12-24 NOTE — [facility] Group Notes (Signed)
LCSW Group Therapy Note   12/24/2017 1:15pm   Type of Therapy and Topic:  Group Therapy:  Trust and Honesty  Participation Level:  Did Not Attend  Description of Group:    In this group patients will be asked to explore the value of being honest.  Patients will be guided to discuss their thoughts, feelings, and behaviors related to honesty and trusting in others. Patients will process together how trust and honesty relate to forming relationships with peers, family members, and self. Each patient will be challenged to identify and express feelings of being vulnerable. Patients will discuss reasons why people are dishonest and identify alternative outcomes if one was truthful (to self or others). This group will be process-oriented, with patients participating in exploration of their own experiences, giving and receiving support, and processing challenge from other group members.   Therapeutic Goals: 1. Patient will identify why honesty is important to relationships and how honesty overall affects relationships.  2. Patient will identify a situation where they lied or were lied too and the  feelings, thought process, and behaviors surrounding the situation 3. Patient will identify the meaning of being vulnerable, how that feels, and how that correlates to being honest with self and others. 4. Patient will identify situations where they could have told the truth, but instead lied and explain reasons of dishonesty.   Summary of Patient Progress    Therapeutic Modalities:   Cognitive Behavioral Therapy Solution Focused Therapy Motivational Interviewing Brief Therapy  Ida RogueRodney B Jai Steil, LCSW 12/24/2017 1:16 PM

## 2017-12-24 NOTE — Progress Notes (Signed)
[facility] Group Notes:  (Nursing/MHT/Case Management/Adjunct)  Date:  12/24/2017  Time:  6:00 PM  Type of Therapy:  Nurse Education  Participation Level:  Did Not Attend  Participation Quality:    Affect:    Cognitive:    Insight:    Engagement in Group:    Modes of Intervention:    Summary of Progress/Problems:  Beatrix ShipperWright, Mehtab Dolberry Martin 12/24/2017, 6:00 PM

## 2017-12-24 NOTE — Progress Notes (Signed)
Nursing 1:1 note D:Pt observed sleeping in bed with eyes closed. RR even and unlabored. No distress noted. A: 1:1 observation continues for safety  R: pt remains safe  

## 2017-12-24 NOTE — Progress Notes (Signed)
[facility] MD Progress Note  12/24/2017 3:22 PM Kahne Helfand  MRN:  [id number] Subjective:    Raymond Melton is a 62 y/o M with history of unspecified bipolar disorder and self-reported history of PTSD who was admitted voluntarily with worsening depression, SI without plan, anxiety, and recent suicide attempt via overdose about 1 month ago. Pt also reported worsened PTSD symptoms such as nightmares, irritability, hypervigilance, and mood lability. Pt was refusing all medications aside from alprazolam on admission, so he was only prescribed PRN medications after initial evaluation. He was placed on 1:1 observation due to fall risk.   Today upon evaluation, pt is drowsy and evaluated in his room. He shares that today he feels "terrible." Pt was asked to share why he came to the hospital, and he replied, "I don't know." With encouragement, pt continued to share more that he has been having a strained relationship with his wife. He endorses irritability and pain from recent neck surgery as additional stressors. He endorses VH of seeing "shadows moving" and he describes SI without plan today. He denies HI/AH. Discussed with patient about treatment options, and pt is initially declining all medications aside from alprazolam, but with encouragement he was in agreement to trial of abilify and zoloft for his mood symptoms as well as gabapentin for his anxiety symptoms. Pt was in agreement with the above plan, and he had no further questions, comments, or concerns.  Principal Problem: Bipolar disorder, unspecified ([place]) Diagnosis:   Patient Active Problem List   Diagnosis Date Noted  . Bipolar disorder, unspecified ([place]) [F31.9] 12/23/2017  . Altered mental status [R41.82] 12/17/2017  . Opioid use disorder, severe, dependence ([place]) [F11.20] 11/26/2016  . History of neck surgery [Z98.890] 11/26/2016  . Hx of hepatitis C [Z86.19] 11/26/2016  . Hypertension [I10] 11/26/2016  . Diabetes ([place]) [E11.9] 11/26/2016   . MDD (major depressive disorder), recurrent severe, without psychosis ([place]) [F33.2] 11/25/2016   Total Time spent with patient: 30 minutes  Past Psychiatric History: see H&P  Past Medical History:  Past Medical History:  Diagnosis Date  . Alcohol abuse   . Altered mental status 12/17/2017  . Chronic pain   . Diabetes mellitus without complication ([place])   . Hepatitis C   . Hypertension   . Immune deficiency disorder ([place])   . Kidney stones   . TB lung, latent     Past Surgical History:  Procedure Laterality Date  . ABDOMINAL SURGERY    . ANKLE SURGERY    . CHOLECYSTECTOMY    . ELBOW SURGERY    . EYE SURGERY    . HERNIA REPAIR    . WRIST SURGERY     Family History:  Family History  Problem Relation Age of Onset  . Depression Mother   . Hypertension Mother   . Leukemia Mother   . Stroke Father   . Hypertension Father   . Cancer Sister   . Hypertension Sister   . Cancer Brother        bladder  . Hypertension Brother    Family Psychiatric  History: see H&P Social History:  Social History   Substance and Sexual Activity  Alcohol Use Yes   Comment: daily     Social History   Substance and Sexual Activity  Drug Use No    Social History   Socioeconomic History  . Marital status: Married    Spouse name: None  . Number of children: None  . Years of education: None  . Highest  education level: None  Social Needs  . Financial resource strain: None  . Food insecurity - worry: None  . Food insecurity - inability: None  . Transportation needs - medical: None  . Transportation needs - non-medical: None  Occupational History  . None  Tobacco Use  . Smoking status: Current Some Day Smoker    Packs/day: 0.50    Types: Cigarettes  . Smokeless tobacco: Never Used  Substance and Sexual Activity  . Alcohol use: Yes    Comment: daily  . Drug use: No  . Sexual activity: Not Currently  Other Topics Concern  . None  Social History Narrative  . None    Additional Social History:                         Sleep: Good  Appetite:  Good  Current Medications: Current Facility-Administered Medications  Medication Dose Route Frequency Provider Last Rate Last Dose  . acetaminophen (TYLENOL) tablet 650 mg  650 mg Oral Q6H PRN Laverle Hobby, PA-C      . alum & mag hydroxide-simeth (MAALOX/MYLANTA) 200-200-20 MG/5ML suspension 30 mL  30 mL Oral Q4H PRN Patriciaann Clan E, PA-C      . ARIPiprazole (ABILIFY) tablet 10 mg  10 mg Oral Daily Pennelope Bracken, MD   10 mg at 12/24/17 1446  . chlordiazePOXIDE (LIBRIUM) capsule 25 mg  25 mg Oral QID PRN Izediuno, Vincent A, MD      . diphenhydrAMINE (BENADRYL) capsule 50 mg  50 mg Oral Q6H PRN Izediuno, Laruth Bouchard, MD       Or  . diphenhydrAMINE (BENADRYL) injection 50 mg  50 mg Intramuscular Q6H PRN Izediuno, Laruth Bouchard, MD   50 mg at 12/23/17 1536  . gabapentin (NEURONTIN) capsule 300 mg  300 mg Oral TID Pennelope Bracken, MD   300 mg at 12/24/17 1446  . haloperidol (HALDOL) tablet 5 mg  5 mg Oral Q6H PRN Izediuno, Laruth Bouchard, MD       Or  . haloperidol lactate (HALDOL) injection 5 mg  5 mg Intramuscular Q6H PRN Izediuno, Laruth Bouchard, MD   5 mg at 12/23/17 1535  . magnesium hydroxide (MILK OF MAGNESIA) suspension 30 mL  30 mL Oral Daily PRN Patriciaann Clan E, PA-C      . metoprolol succinate (TOPROL-XL) 24 hr tablet 100 mg  100 mg Oral Daily Patriciaann Clan E, PA-C   100 mg at 12/24/17 1422  . nicotine (NICODERM CQ - dosed in mg/24 hours) patch 21 mg  21 mg Transdermal Daily Cobos, Myer Peer, MD   21 mg at 12/24/17 1423  . rosuvastatin (CRESTOR) tablet 10 mg  10 mg Oral Daily Patriciaann Clan E, PA-C   10 mg at 12/24/17 1446  . sertraline (ZOLOFT) tablet 50 mg  50 mg Oral Daily Pennelope Bracken, MD   50 mg at 12/24/17 1447  . tiZANidine (ZANAFLEX) tablet 4 mg  4 mg Oral Q8H PRN Laverle Hobby, PA-C   4 mg at 12/23/17 0951  . traZODone (DESYREL) tablet 50 mg  50 mg Oral QHS,MR  X 1 Laverle Hobby, PA-C   50 mg at 12/23/17 2302    Lab Results:  Results for orders placed or performed during the hospital encounter of 12/22/17 (from the past 48 hour(s))  Comprehensive metabolic panel     Status: Abnormal   Collection Time: 12/22/17 10:48 PM  Result Value Ref Range   Sodium 137  135 - 145 mmol/L   Potassium 4.2 3.5 - 5.1 mmol/L   Chloride 104 101 - 111 mmol/L   CO2 26 22 - 32 mmol/L   Glucose, Bld 116 (H) 65 - 99 mg/dL   BUN 18 6 - 20 mg/dL   Creatinine, Ser 0.93 0.61 - 1.24 mg/dL   Calcium 9.7 8.9 - 10.3 mg/dL   Total Protein 7.5 6.5 - 8.1 g/dL   Albumin 4.6 3.5 - 5.0 g/dL   AST 26 15 - 41 U/L   ALT 25 17 - 63 U/L   Alkaline Phosphatase 116 38 - 126 U/L   Total Bilirubin 0.4 0.3 - 1.2 mg/dL   GFR calc non Af Amer >60 >60 mL/min   GFR calc Af Amer >60 >60 mL/min    Comment: (NOTE) The eGFR has been calculated using the CKD EPI equation. This calculation has not been validated in all clinical situations. eGFR's persistently <60 mL/min signify possible Chronic Kidney Disease.    Anion gap 7 5 - 15  Ethanol     Status: None   Collection Time: 12/22/17 10:48 PM  Result Value Ref Range   Alcohol, Ethyl (B) <10 <10 mg/dL    Comment:        LOWEST DETECTABLE LIMIT FOR SERUM ALCOHOL IS 10 mg/dL FOR MEDICAL PURPOSES ONLY   Salicylate level     Status: None   Collection Time: 12/22/17 10:48 PM  Result Value Ref Range   Salicylate Lvl <3.3 2.8 - 30.0 mg/dL  Acetaminophen level     Status: Abnormal   Collection Time: 12/22/17 10:48 PM  Result Value Ref Range   Acetaminophen (Tylenol), Serum <10 (L) 10 - 30 ug/mL    Comment:        THERAPEUTIC CONCENTRATIONS VARY SIGNIFICANTLY. A RANGE OF 10-30 ug/mL MAY BE AN EFFECTIVE CONCENTRATION FOR MANY PATIENTS. HOWEVER, SOME ARE BEST TREATED AT CONCENTRATIONS OUTSIDE THIS RANGE. ACETAMINOPHEN CONCENTRATIONS >150 ug/mL AT 4 HOURS AFTER INGESTION AND >50 ug/mL AT 12 HOURS AFTER INGESTION ARE OFTEN ASSOCIATED  WITH TOXIC REACTIONS.   cbc     Status: Abnormal   Collection Time: 12/22/17 10:48 PM  Result Value Ref Range   WBC 6.3 4.0 - 10.5 K/uL   RBC 4.37 4.22 - 5.81 MIL/uL   Hemoglobin 14.9 13.0 - 17.0 g/dL   HCT 41.3 39.0 - 52.0 %   MCV 94.5 78.0 - 100.0 fL   MCH 34.1 (H) 26.0 - 34.0 pg   MCHC 36.1 (H) 30.0 - 36.0 g/dL   RDW 12.1 11.5 - 15.5 %   Platelets 198 150 - 400 K/uL  Rapid urine drug screen (hospital performed)     Status: Abnormal   Collection Time: 12/22/17 11:40 PM  Result Value Ref Range   Opiates NONE DETECTED NONE DETECTED   Cocaine NONE DETECTED NONE DETECTED   Benzodiazepines POSITIVE (A) NONE DETECTED   Amphetamines NONE DETECTED NONE DETECTED   Tetrahydrocannabinol NONE DETECTED NONE DETECTED   Barbiturates NONE DETECTED NONE DETECTED    Comment: (NOTE) DRUG SCREEN FOR MEDICAL PURPOSES ONLY.  IF CONFIRMATION IS NEEDED FOR ANY PURPOSE, NOTIFY LAB WITHIN 5 DAYS. LOWEST DETECTABLE LIMITS FOR URINE DRUG SCREEN Drug Class                     Cutoff (ng/mL) Amphetamine and metabolites    1000 Barbiturate and metabolites    200 Benzodiazepine  601 Tricyclics and metabolites     300 Opiates and metabolites        300 Cocaine and metabolites        300 THC                            50     Blood Alcohol level:  Lab Results  Component Value Date   ETH <10 12/22/2017   ETH <11 09/32/3557    Metabolic Disorder Labs: Lab Results  Component Value Date   HGBA1C 6.0 (H) 11/27/2016   MPG 126 11/27/2016   No results found for: PROLACTIN No results found for: CHOL, TRIG, HDL, CHOLHDL, VLDL, LDLCALC  Physical Findings: AIMS: Facial and Oral Movements Muscles of Facial Expression: None, normal Lips and Perioral Area: None, normal Jaw: None, normal Tongue: None, normal,Extremity Movements Upper (arms, wrists, hands, fingers): None, normal Lower (legs, knees, ankles, toes): None, normal, Trunk Movements Neck, shoulders, hips: None, normal,  Overall Severity Severity of abnormal movements (highest score from questions above): None, normal Incapacitation due to abnormal movements: None, normal Patient's awareness of abnormal movements (rate only patient's report): No Awareness, Dental Status Current problems with teeth and/or dentures?: No Does patient usually wear dentures?: No  CIWA:  CIWA-Ar Total: 1 COWS:  COWS Total Score: 1  Musculoskeletal: Strength & Muscle Tone: within normal limits Gait & Station: normal Patient leans: N/A  Psychiatric Specialty Exam: Physical Exam  Nursing note and vitals reviewed.   Review of Systems  Constitutional: Positive for malaise/fatigue. Negative for chills and fever.  Gastrointestinal: Negative for abdominal pain, diarrhea, heartburn, nausea and vomiting.  Neurological: Positive for dizziness.  Psychiatric/Behavioral: Positive for depression, hallucinations and suicidal ideas. The patient is nervous/anxious.     Blood pressure (!) 150/86, pulse 67, temperature 99.2 F (37.3 C), resp. rate 16, height 5' 11.65" (1.82 m), weight 72 kg (158 lb 11.7 oz).Body mass index is 21.74 kg/m.  General Appearance: Casual and Disheveled  Eye Contact:  Good  Speech:  Clear and Coherent and Normal Rate  Volume:  Normal  Mood:  Anxious and Depressed  Affect:  Blunt and Congruent  Thought Process:  Coherent and Goal Directed  Orientation:  Full (Time, Place, and Person)  Thought Content:  Hallucinations: Visual  Suicidal Thoughts:  Yes.  without intent/plan  Homicidal Thoughts:  No  Memory:  Immediate;   Fair Recent;   Fair Remote;   Fair  Judgement:  Impaired  Insight:  Lacking  Psychomotor Activity:  Normal  Concentration:  Concentration: Fair  Recall:  [employer] of Knowledge:  Fair  Language:  Fair  Akathisia:  No  Handed:    AIMS (if indicated):     Assets:  Communication Skills Housing Intimacy Leisure Time Physical Health Resilience  ADL's:  Intact  Cognition:  WNL   Sleep:  Number of Hours: 6     Treatment Plan Summary: Daily contact with patient to assess and evaluate symptoms and progress in treatment and Medication management. Pt reports ongoing depression, anxiety, VH, SI, and symptoms of PTSD. He agrees to trial of zoloft, abilify, and gabapentin to address his symptoms.  - Continue inpatient hospitalization  - Bipolar disorder unspecified / PTSD  - Start Abilify 56m po qDay  - Start zoloft 581mpo qDay  -Anxiety  - Start gabapentin 30034mo TID  - Benzodiazepine withdrawal   - Continue CIWA with librium 70m50m QID prn CIWA>10  -Agitation  -  Continue benadryl 66m po/IM q6h prn agitation   - Continue haldol 521mpo/IM q6h prn agitation  - HLD   - Continue rosuvastatin 1056mo qDay  - HTN  - Continue Toprol-XL 100m74may  -Insomnia  - Continue trazodone 50mg30mqhs prn insomnia  -Encourage participation in groups and therapeutic milieu  -Discharge planning will be ongoing  ChrisPennelope Bracken1/30/2019, 3:22 PM

## 2017-12-24 NOTE — Progress Notes (Signed)
D:Pt has been sleeping most of the day. He is using a bone stimulator in his room to help with chronic neck pain. Pt refused his medications earlier until he could get his bone stimulator. MD wrote an order and pt received equipment to use as ordered with 1:1 monitoring.He then took his scheduled medications later than originally ordered.  A:Offered support and 1:1 monitoring. R:Pt verbally denies si and hi. Safety maintained on the unit.

## 2017-12-24 NOTE — Plan of Care (Signed)
  Safety: Periods of time without injury will increase 12/24/2017 2207 - Progressing by Delos HaringPhillips, Heron A, RN Note Pt safe on the unit at this time

## 2017-12-24 NOTE — Progress Notes (Signed)
D:Pt is placed on a 1:1 due to high fall risk/safety per morning report. A:1:1 monitoring for safety. R:Pt is currently resting in his room. Respirations are even and unlabored. Safety maintained on the unit.

## 2017-12-25 ENCOUNTER — Encounter (HOSPITAL_COMMUNITY): Payer: Self-pay | Admitting: Emergency Medicine

## 2017-12-25 ENCOUNTER — Inpatient Hospital Stay (HOSPITAL_COMMUNITY): Payer: 59

## 2017-12-25 ENCOUNTER — Telehealth: Payer: Self-pay | Admitting: Neurology

## 2017-12-25 LAB — CBC WITH DIFFERENTIAL/PLATELET
BASOS ABS: 0 10*3/uL (ref 0.0–0.1)
Basophils Relative: 0 %
EOS PCT: 0 %
Eosinophils Absolute: 0 10*3/uL (ref 0.0–0.7)
HCT: 41 % (ref 39.0–52.0)
Hemoglobin: 14.5 g/dL (ref 13.0–17.0)
LYMPHS ABS: 1.2 10*3/uL (ref 0.7–4.0)
Lymphocytes Relative: 19 %
MCH: 33.3 pg (ref 26.0–34.0)
MCHC: 35.4 g/dL (ref 30.0–36.0)
MCV: 94.3 fL (ref 78.0–100.0)
Monocytes Absolute: 0.7 10*3/uL (ref 0.1–1.0)
Monocytes Relative: 11 %
NEUTROS PCT: 70 %
Neutro Abs: 4.3 10*3/uL (ref 1.7–7.7)
PLATELETS: 130 10*3/uL — AB (ref 150–400)
RBC: 4.35 MIL/uL (ref 4.22–5.81)
RDW: 12.1 % (ref 11.5–15.5)
WBC: 6.1 10*3/uL (ref 4.0–10.5)

## 2017-12-25 LAB — COMPREHENSIVE METABOLIC PANEL
ALK PHOS: 95 U/L (ref 38–126)
ALT: 20 U/L (ref 17–63)
AST: 26 U/L (ref 15–41)
Albumin: 4.2 g/dL (ref 3.5–5.0)
Anion gap: 6 (ref 5–15)
BILIRUBIN TOTAL: 1 mg/dL (ref 0.3–1.2)
BUN: 14 mg/dL (ref 6–20)
CO2: 26 mmol/L (ref 22–32)
Calcium: 9.3 mg/dL (ref 8.9–10.3)
Chloride: 102 mmol/L (ref 101–111)
Creatinine, Ser: 1.02 mg/dL (ref 0.61–1.24)
Glucose, Bld: 122 mg/dL — ABNORMAL HIGH (ref 65–99)
Potassium: 3.8 mmol/L (ref 3.5–5.1)
Sodium: 134 mmol/L — ABNORMAL LOW (ref 135–145)
Total Protein: 7.3 g/dL (ref 6.5–8.1)

## 2017-12-25 MED ORDER — GADOBENATE DIMEGLUMINE 529 MG/ML IV SOLN
20.0000 mL | Freq: Once | INTRAVENOUS | Status: AC | PRN
  Administered 2017-12-25: 18 mL via INTRAVENOUS

## 2017-12-25 MED ORDER — TRAZODONE HCL 100 MG PO TABS
100.0000 mg | ORAL_TABLET | Freq: Every evening | ORAL | Status: DC | PRN
  Administered 2017-12-25: 100 mg via ORAL
  Filled 2017-12-25 (×4): qty 1

## 2017-12-25 NOTE — Progress Notes (Signed)
Pt presents unsteady, weak and unable to hold a cup of water without spilling it. He is also falling over while attempting to sit up in bed. MD made aware and writer instructed to hold morning medications Per evaluation from MD, [facility] and MHT. .[name]

## 2017-12-25 NOTE — ED Notes (Signed)
Bed: WA30 Expected date:  Expected time:  Means of arrival:  Comments: 

## 2017-12-25 NOTE — Progress Notes (Signed)
Pt stated that Trazodone and Benadryl does not make him sleep. Pt stated Xanax or Ativan would help him, pt stated Librium would not work.

## 2017-12-25 NOTE — Progress Notes (Signed)
Non emergency EMS called for transport [phone].

## 2017-12-25 NOTE — ED Notes (Signed)
Patient transported to MRI 

## 2017-12-25 NOTE — ED Notes (Signed)
[facility]  Patient discharged back to behavioral health.  Transported by Juel BurrowPelham.

## 2017-12-25 NOTE — Progress Notes (Signed)
[facility] As pt was coming to the 500 hall, he tried to run through the door and was blocked by the MHT. Pt said that he wanted to leave. Pt was explained discharge process. When asked why he volunteered to come to the hospital, he said that he came to the hospital because he was dizzy. He also said that he was told that he could see a counselor sooner if he came to the hospital. Pt de-escalated and is being monitored 1:1 for safety. Safety maintained on the unit.

## 2017-12-25 NOTE — ED Notes (Signed)
Patient returned to MRI to finish second part of test.

## 2017-12-25 NOTE — Progress Notes (Signed)
Writer asked Clinical research associatewriter if his spouse could be contacted about [facility]. Per pt he had already contacted his wife.

## 2017-12-25 NOTE — ED Triage Notes (Signed)
[facility] with complaints of increased weakness. Staff reports that patient that patient is unable to sit upright. Only able to eat crackers. AAOx4

## 2017-12-25 NOTE — Progress Notes (Signed)
Pt did not attend group. 

## 2017-12-25 NOTE — Progress Notes (Signed)
Nursing 1:1 note D:Pt observed lying in bed with eyes closed. RR even and unlabored. No distress noted. Pt expressed general weakness A: 1:1 observation continues for safety  R: pt remains safe

## 2017-12-25 NOTE — ED Notes (Signed)
Bed: WA27 Expected date:  Expected time:  Means of arrival:  Comments: TRANSFER FROM [facility]-UNSTEADY/WEAK

## 2017-12-25 NOTE — Progress Notes (Signed)
Staff discovered contraband (white pills) in pt's bone stimulator bag. Two pills were also found in pt's bed. Pt reports that they are his pills and he takes them as needed with his stimulator. AC and NP contacted. Stimulator removed from pt's room and pt explained only pills [facility] and given by staff were allowed on the unit.

## 2017-12-25 NOTE — Progress Notes (Addendum)
D: Pt denies SI/HI/AVH. Pt is very sad, pt stated he was weak. Pt stated " can't get any weaker than benadryl". Pt presents as if he is med seeking, pt continues to only say Xanax or Ativan would help him. Pt stated nothing else helps him.   A: Pt was offered support and encouragement. Pt was given scheduled medications. Pt was encourage to attend groups. Q 15 minute checks were done for safety.   R:Pt does not attend groups , appears to not be vested in his Tx, pt appears to not be getting anything out of his stay here, pt does not participate in groups/ other programming for him. safety maintained on unit.

## 2017-12-25 NOTE — Progress Notes (Signed)
Nursing 1:1 note D:Pt observed sleeping in bed with eyes closed. RR even and unlabored. No distress noted. A: 1:1 observation continues for safety  R: pt remains safe  

## 2017-12-25 NOTE — Progress Notes (Signed)
Nursing 1:1 note D:Pt observed laying in bed. RR even and unlabored. No distress noted. A: 1:1 observation continues for safety  R: pt remains safe

## 2017-12-25 NOTE — Telephone Encounter (Signed)
I got a call from Dr. Rennis ChrisJacobowitz from the emergency room.  The patient has been in behavioral health, he was noted this morning to be unable to get up out of bed on his own.  He has been placed on a benzodiazepine, he is on tizanidine and Benadryl.  Examination in the ER appears to be unremarkable, he is ambulating relatively normally.  They will get MRA of the head and neck, we will get the EEG study as an outpatient.  If the blood work and MRA evaluations are unremarkable, he will be sent back to behavioral health.

## 2017-12-25 NOTE — Progress Notes (Signed)
[facility] MD Progress Note  12/25/2017 9:55 AM Raymond Melton  MRN:  [id number] Subjective:    Raymond Melton is a 62 y/o M with history of unspecified bipolar disorder and self-reported history of PTSD who was admitted voluntarily with worsening depression, SI without plan, anxiety, and recent suicide attempt via overdose about 1 month ago. Pt also reported worsened PTSD symptoms such as nightmares, irritability, hypervigilance, and mood lability. Pt was refusing all medications aside from alprazolam on admission, so he was only prescribed PRN medications after initial evaluation. He was placed on 1:1 observation due to fall risk. Pt remained drowsy, and reported increasing weakness in his upper body.  Today upon evaluation, pt remains on 1:1 and he is appears diaphoretic and clouded in sensorium. He states, "I'm not good. My upper body is weak." Pt is unable to rise to seated from supine position without significant assistance. He denies SI/HI/AH/VH.  As pt is unable to participate in programming and has significant new onset weakness, he would benefit from medical evaluation and then re-evaluation if he is appropriate for further treatment in the inpatient psychiatry setting as he is a voluntary patient. Pt will be transferred to ED for further treatment and workup of weakness.  Principal Problem: Bipolar disorder, unspecified (HCC) Diagnosis:   Patient Active Problem List   Diagnosis Date Noted  . Bipolar disorder, unspecified (HCC) [F31.9] 12/23/2017  . Altered mental status [R41.82] 12/17/2017  . Opioid use disorder, severe, dependence (HCC) [F11.20] 11/26/2016  . History of neck surgery [Z98.890] 11/26/2016  . Hx of hepatitis C [Z86.19] 11/26/2016  . Hypertension [I10] 11/26/2016  . Diabetes (HCC) [E11.9] 11/26/2016  . MDD (major depressive disorder), recurrent severe, without psychosis (HCC) [F33.2] 11/25/2016   Total Time spent with patient: 15 minutes  Past Psychiatric History: see  H&P  Past Medical History:  Past Medical History:  Diagnosis Date  . Alcohol abuse   . Altered mental status 12/17/2017  . Chronic pain   . Diabetes mellitus without complication (HCC)   . Hepatitis C   . Hypertension   . Immune deficiency disorder (HCC)   . Kidney stones   . TB lung, latent     Past Surgical History:  Procedure Laterality Date  . ABDOMINAL SURGERY    . ANKLE SURGERY    . CHOLECYSTECTOMY    . ELBOW SURGERY    . EYE SURGERY    . HERNIA REPAIR    . WRIST SURGERY     Family History:  Family History  Problem Relation Age of Onset  . Depression Mother   . Hypertension Mother   . Leukemia Mother   . Stroke Father   . Hypertension Father   . Cancer Sister   . Hypertension Sister   . Cancer Brother        bladder  . Hypertension Brother    Family Psychiatric  History: see H&P Social History:  Social History   Substance and Sexual Activity  Alcohol Use Yes   Comment: daily     Social History   Substance and Sexual Activity  Drug Use No    Social History   Socioeconomic History  . Marital status: Married    Spouse name: None  . Number of children: None  . Years of education: None  . Highest education level: None  Social Needs  . Financial resource strain: None  . Food insecurity - worry: None  . Food insecurity - inability: None  . Transportation needs - medical: None  .  Transportation needs - non-medical: None  Occupational History  . None  Tobacco Use  . Smoking status: Current Some Day Smoker    Packs/day: 0.50    Types: Cigarettes  . Smokeless tobacco: Never Used  Substance and Sexual Activity  . Alcohol use: Yes    Comment: daily  . Drug use: No  . Sexual activity: Not Currently  Other Topics Concern  . None  Social History Narrative  . None   Additional Social History:                         Sleep: Good  Appetite:  Fair  Current Medications: Current Facility-Administered Medications  Medication Dose  Route Frequency Provider Last Rate Last Dose  . acetaminophen (TYLENOL) tablet 650 mg  650 mg Oral Q6H PRN Kerry Hough, PA-C   650 mg at 12/24/17 1526  . alum & mag hydroxide-simeth (MAALOX/MYLANTA) 200-200-20 MG/5ML suspension 30 mL  30 mL Oral Q4H PRN Donell Sievert E, PA-C      . ARIPiprazole (ABILIFY) tablet 10 mg  10 mg Oral Daily Micheal Likens, MD   10 mg at 12/25/17 0848  . chlordiazePOXIDE (LIBRIUM) capsule 25 mg  25 mg Oral QID PRN Izediuno, Vincent A, MD      . diphenhydrAMINE (BENADRYL) capsule 50 mg  50 mg Oral Q6H PRN Izediuno, Delight Ovens, MD   50 mg at 12/25/17 0029   Or  . diphenhydrAMINE (BENADRYL) injection 50 mg  50 mg Intramuscular Q6H PRN Izediuno, Delight Ovens, MD   50 mg at 12/23/17 1536  . gabapentin (NEURONTIN) capsule 300 mg  300 mg Oral TID Jolyne Loa T, MD   300 mg at 12/25/17 0848  . haloperidol (HALDOL) tablet 5 mg  5 mg Oral Q6H PRN Izediuno, Delight Ovens, MD       Or  . haloperidol lactate (HALDOL) injection 5 mg  5 mg Intramuscular Q6H PRN Izediuno, Delight Ovens, MD   5 mg at 12/23/17 1535  . magnesium hydroxide (MILK OF MAGNESIA) suspension 30 mL  30 mL Oral Daily PRN Donell Sievert E, PA-C      . metoprolol succinate (TOPROL-XL) 24 hr tablet 100 mg  100 mg Oral Daily Donell Sievert E, PA-C   100 mg at 12/25/17 0848  . nicotine (NICODERM CQ - dosed in mg/24 hours) patch 21 mg  21 mg Transdermal Daily Cobos, Rockey Situ, MD   21 mg at 12/25/17 0849  . rosuvastatin (CRESTOR) tablet 10 mg  10 mg Oral Daily Kerry Hough, PA-C   10 mg at 12/25/17 0848  . sertraline (ZOLOFT) tablet 50 mg  50 mg Oral Daily Micheal Likens, MD   50 mg at 12/25/17 0848  . tiZANidine (ZANAFLEX) tablet 4 mg  4 mg Oral Q8H PRN Kerry Hough, PA-C   4 mg at 12/23/17 0951  . traZODone (DESYREL) tablet 50 mg  50 mg Oral QHS,MR X 1 Kerry Hough, PA-C   50 mg at 12/25/17 0028    Lab Results: No results found for this or any previous visit (from the past 48  hour(s)).  Blood Alcohol level:  Lab Results  Component Value Date   ETH <10 12/22/2017   ETH <11 02/15/2013    Metabolic Disorder Labs: Lab Results  Component Value Date   HGBA1C 6.0 (H) 11/27/2016   MPG 126 11/27/2016   No results found for: PROLACTIN No results found for: CHOL, TRIG, HDL, CHOLHDL,  VLDL, LDLCALC  Physical Findings: AIMS: Facial and Oral Movements Muscles of Facial Expression: None, normal Lips and Perioral Area: None, normal Jaw: None, normal Tongue: None, normal,Extremity Movements Upper (arms, wrists, hands, fingers): None, normal Lower (legs, knees, ankles, toes): None, normal, Trunk Movements Neck, shoulders, hips: None, normal, Overall Severity Severity of abnormal movements (highest score from questions above): None, normal Incapacitation due to abnormal movements: None, normal Patient's awareness of abnormal movements (rate only patient's report): No Awareness, Dental Status Current problems with teeth and/or dentures?: No Does patient usually wear dentures?: No  CIWA:  CIWA-Ar Total: 1 COWS:  COWS Total Score: 1  Musculoskeletal: Strength & Muscle Tone: decreased Gait & Station: unsteady, unable to stand Patient leans: N/A  Psychiatric Specialty Exam: Physical Exam  Nursing note and vitals reviewed.   Review of Systems  Constitutional: Positive for malaise/fatigue.  Respiratory: Positive for cough and sputum production.   Cardiovascular: Negative for chest pain.  Neurological: Positive for weakness. Negative for dizziness.  Psychiatric/Behavioral: Negative for depression, hallucinations and suicidal ideas. The patient is not nervous/anxious and does not have insomnia.     Blood pressure 109/67, pulse 70, temperature 97.8 F (36.6 C), resp. rate 16, height 5' 11.65" (1.82 m), weight 72 kg (158 lb 11.7 oz).Body mass index is 21.74 kg/m.  General Appearance: Disheveled  Eye Contact:  Fair  Speech:  Garbled  Volume:  Decreased  Mood:   Anxious  Affect:  Blunt  Thought Process:  Coherent and Goal Directed  Orientation:  Full (Time, Place, and Person)  Thought Content:  Logical  Suicidal Thoughts:  No  Homicidal Thoughts:  No  Memory:  Immediate;   Fair Recent;   Fair Remote;   Fair  Judgement:  Fair  Insight:  Fair  Psychomotor Activity:  Decreased  Concentration:  Concentration: Fair  Recall:  [employer]Fair  Fund of Knowledge:  Fair  Language:  Fair  Akathisia:  No  Handed:    AIMS (if indicated):     Assets:  Communication Skills Leisure Time Resilience Social Support  ADL's:  Intact  Cognition:  WNL  Sleep:  Number of Hours: 4.75     Treatment Plan Summary: Daily contact with patient to assess and evaluate symptoms and progress in treatment and Medication management.   - Transfer to ED for evaluation of weakness and clouded sensorium  - Hold all medications until further evaluation   Micheal Likenshristopher T Jaris Kohles, MD 12/25/2017, 9:55 AM

## 2017-12-25 NOTE — ED Provider Notes (Signed)
[facility]-EMERGENCY DEPT Provider Note   CSN: [id number] Arrival date & time: 12/23/17  0228     History   Chief Complaint Chief Complaint  Patient presents with  . Weakness    HPI Raymond Melton is a 62 y.o. male. Level 5 caveat psychiatric patient history is obtained from notes accompany patient and from prior medical records. [facility] was reported to be drowsy earlier today and unable to participate group therapy and reported increased weakness in upper body.  [facility]  He had been prior medicated with alprazolam, he had been falling while attempting to sit up in bed.  Past Medical History:  Diagnosis Date  . Alcohol abuse   . Altered mental status 12/17/2017  . Chronic pain   . Diabetes mellitus without complication (HCC)   . Hepatitis C   . Hypertension   . Immune deficiency disorder (HCC)   . Kidney stones   . TB lung, latent     Patient Active Problem List   Diagnosis Date Noted  . Bipolar disorder, unspecified (HCC) 12/23/2017  . Altered mental status 12/17/2017  . Opioid use disorder, severe, dependence (HCC) 11/26/2016  . History of neck surgery 11/26/2016  . Hx of hepatitis C 11/26/2016  . Hypertension 11/26/2016  . Diabetes (HCC) 11/26/2016  . MDD (major depressive disorder), recurrent severe, 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    Past Surgical History:  Procedure Laterality Date  . ABDOMINAL SURGERY    . ANKLE SURGERY    . CHOLECYSTECTOMY    . ELBOW SURGERY    . EYE SURGERY    . HERNIA REPAIR    . WRIST SURGERY         Home Medications    Prior to Admission medications   Medication Sig Start Date End Date Taking? Authorizing Provider  ALPRAZolam Prudy Feeler(XANAX) 1 MG tablet Take 1 mg by mouth at bedtime.    [provider]  ergocalciferol (VITAMIN D2) 50000 units capsule Take 50,000 Units by mouth 2 (two) times a week.    [provider]  metoprolol succinate (TOPROL-XL) 100 MG 24 hr tablet Take 1 tablet (100 mg total) by mouth daily. Take with or immediately following a meal: For high blood pressure 12/01/16   Nwoko, Nicole KindredAgnes I, NP  promethazine (PHENERGAN) 25 MG tablet Take 25 mg by mouth every 12 (twelve) hours as needed for nausea or vomiting.    [provider]  rosuvastatin (CRESTOR) 10 MG tablet Take 10 mg by mouth daily.    [provider]  tiZANidine (ZANAFLEX) 4 MG tablet Take 4 mg by mouth every 8 (eight) hours as needed for muscle spasms.    [provider]    Family History Family History  Problem Relation Age of Onset  . Depression Mother   . Hypertension Mother   . Leukemia Mother   . Stroke Father   . Hypertension Father   . Cancer Sister   . Hypertension Sister   . Cancer Brother        bladder  . Hypertension Brother     Social History Social History   Tobacco Use  . Smoking status: Current Some Day Smoker    Packs/day: 0.50    Types: Cigarettes  . Smokeless tobacco: Never Used  Substance Use Topics  . Alcohol use: Yes    Comment: daily  . Drug use: No  Allergies   Contrast media [iodinated diagnostic agents]; Ioxaglate; Metrizamide; Other; Codeine; Iohexol; Latex; Lisinopril; Tuberculin; Tuberculin tests; Valsartan; and Zolpidem   Review of Systems Review of Systems  Unable to perform ROS: Psychiatric disorder  Neurological: Positive for weakness.        Generalized weakness, drowsy     Physical Exam Updated Vital Signs BP (!) 141/78 (BP Location: Right Arm)   Pulse 71   Temp 97.8 F (36.6 C)   Resp 18   Ht 5' 11.65" (1.82 m)   Wt 72 kg (158 lb 11.7 oz)   SpO2 99%   BMI 21.74 kg/m   Physical Exam  Constitutional: He appears well-developed and well-nourished. No distress.  HENT:  Head: Normocephalic and atraumatic.  Eyes: Conjunctivae are normal. Pupils are equal, round, and reactive to light.  Neck: Neck supple. No tracheal deviation  present. No thyromegaly present.  Cardiovascular: Normal rate and regular rhythm.  No murmur heard. Pulmonary/Chest: Effort normal and breath sounds normal.  Abdominal: Soft. Bowel sounds are normal. He exhibits no distension. There is no tenderness.  Musculoskeletal: Normal range of motion. He exhibits no edema or tenderness.  Neurological: He is alert. Coordination normal.  No asterixis.  Gait is slightly broad-based, steady.  Does not lean to one side or the other.  Finger to nose normal DTR symmetric bilaterally at knee jerk ankle jerk and biceps toes downward going bilaterally motor strength 5/5 overall Romberg normal pronator drift normal  Skin: Skin is warm and dry. No rash noted.  Psychiatric: He has a normal mood and affect.  Nursing note and vitals reviewed.   3 PM patient alert awake Glasgow Coma Score 15 gait is normal.  Moves all extremities well. ED Treatments / Results  Labs (all labs ordered are listed, but only abnormal results are displayed) Labs Reviewed  CBC WITH DIFFERENTIAL/PLATELET - Abnormal; Notable for the following components:      Result Value   Platelets 130 (*)    All other components within normal limits  COMPREHENSIVE METABOLIC PANEL    EKG  EKG Interpretation None       Radiology No results found.  Procedures Procedures (including critical care time)  Medications Ordered in ED Medications  metoprolol succinate (TOPROL-XL) 24 hr tablet 100 mg (100 mg Oral Not Given 12/25/17 0848)  rosuvastatin (CRESTOR) tablet 10 mg (10 mg Oral Not Given 12/25/17 0848)  tiZANidine (ZANAFLEX) tablet 4 mg (4 mg Oral Given 12/23/17 0951)  acetaminophen (TYLENOL) tablet 650 mg (650 mg Oral Given 12/24/17 1526)  alum & mag hydroxide-simeth (MAALOX/MYLANTA) 200-200-20 MG/5ML suspension 30 mL (not administered)  magnesium hydroxide (MILK OF MAGNESIA) suspension 30 mL (not administered)  traZODone (DESYREL) tablet 50 mg (50 mg Oral Not Given 12/25/17 0032)  nicotine  (NICODERM CQ - dosed in mg/24 hours) patch 21 mg (0 mg Transdermal Not Given 12/25/17 0849)  haloperidol (HALDOL) tablet 5 mg ( Oral See Alternative 12/23/17 1535)    Or  haloperidol lactate (HALDOL) injection 5 mg (5 mg Intramuscular Given 12/23/17 1535)  diphenhydrAMINE (BENADRYL) capsule 50 mg (50 mg Oral Given 12/25/17 0029)    Or  diphenhydrAMINE (BENADRYL) injection 50 mg ( Intramuscular See Alternative 12/25/17 0029)  diphenhydrAMINE (BENADRYL) 25 mg capsule (  Not Given 12/23/17 1520)  haloperidol (HALDOL) 5 MG tablet (5 mg  Not Given 12/23/17 1518)  chlordiazePOXIDE (LIBRIUM) capsule 25 mg (not administered)  gabapentin (NEURONTIN) capsule 300 mg (300 mg Oral Not Given 12/25/17 0848)  ARIPiprazole (ABILIFY) tablet 10 mg (  10 mg Oral Not Given 12/25/17 0848)  sertraline (ZOLOFT) tablet 50 mg (50 mg Oral Not Given 12/25/17 0848)    Results for orders placed or performed during the hospital encounter of 12/23/17  Comprehensive metabolic panel  Result Value Ref Range   Sodium 134 (L) 135 - 145 mmol/L   Potassium 3.8 3.5 - 5.1 mmol/L   Chloride 102 101 - 111 mmol/L   CO2 26 22 - 32 mmol/L   Glucose, Bld 122 (H) 65 - 99 mg/dL   BUN 14 6 - 20 mg/dL   Creatinine, Ser 9.60 0.61 - 1.24 mg/dL   Calcium 9.3 8.9 - 45.4 mg/dL   Total Protein 7.3 6.5 - 8.1 g/dL   Albumin 4.2 3.5 - 5.0 g/dL   AST 26 15 - 41 U/L   ALT 20 17 - 63 U/L   Alkaline Phosphatase 95 38 - 126 U/L   Total Bilirubin 1.0 0.3 - 1.2 mg/dL   GFR calc non Af Amer >60 >60 mL/min   GFR calc Af Amer >60 >60 mL/min   Anion gap 6 5 - 15  CBC with Differential/Platelet  Result Value Ref Range   WBC 6.1 4.0 - 10.5 K/uL   RBC 4.35 4.22 - 5.81 MIL/uL   Hemoglobin 14.5 13.0 - 17.0 g/dL   HCT 09.8 11.9 - 14.7 %   MCV 94.3 78.0 - 100.0 fL   MCH 33.3 26.0 - 34.0 pg   MCHC 35.4 30.0 - 36.0 g/dL   RDW 82.9 56.2 - 13.0 %   Platelets 130 (L) 150 - 400 K/uL   Neutrophils Relative % 70 %   Neutro Abs 4.3 1.7 - 7.7 K/uL   Lymphocytes  Relative 19 %   Lymphs Abs 1.2 0.7 - 4.0 K/uL   Monocytes Relative 11 %   Monocytes Absolute 0.7 0.1 - 1.0 K/uL   Eosinophils Relative 0 %   Eosinophils Absolute 0.0 0.0 - 0.7 K/uL   Basophils Relative 0 %   Basophils Absolute 0.0 0.0 - 0.1 K/uL   Mr Angiogram Head Wo Contrast  Result Date: 12/25/2017 CLINICAL DATA:  62 year old male with increased generalized muscle weakness. EXAM: MRA HEAD WITHOUT CONTRAST MRA NECK WITHOUT AND WITH CONTRAST TECHNIQUE: Angiographic images of the Circle of Willis were obtained using MRA technique without intravenous contrast. Angiographic images of the neck were obtained using MRA technique without and with intravenous contrast. Carotid stenosis measurements (when applicable) are obtained utilizing NASCET criteria, using the distal internal carotid diameter as the denominator. CONTRAST:  18mL MULTIHANCE GADOBENATE DIMEGLUMINE 529 MG/ML IV SOLN COMPARISON:  None. FINDINGS: MRA NECK FINDINGS Precontrast time-of-flight images reveal antegrade flow signal in the bilateral carotid and vertebral arteries from the thoracic inlet to the skull base. Post-contrast neck MRA images reveal a 3 vessel arch configuration with normal great vessel origins. Normal right CCA and right carotid bifurcation. Normal cervical right ICA. Normal left CCA. The left carotid bifurcation seems to be obscured by artifact, but there is preserved enhancement of the proximal left ICA. The mid and distal cervical left ICA appear normal. Both vertebral artery origins and cervical vertebral arteries are normal, the right is mildly dominant. MRA HEAD FINDINGS This study is intermittently degraded by motion artifact despite repeated imaging attempts. No intracranial mass effect or ventriculomegaly. Grossly normal cerebral morphology. Antegrade flow in the posterior circulation with mildly dominant distal right vertebral artery. Normal PICA origins and vertebrobasilar junction. Normal basilar artery. Patent SCA  and PCA origins. Mild motion artifact at  the PCA levels, but PCA branches appear within normal limits. Posterior communicating arteries are diminutive or absent. Antegrade flow in both ICA siphons. No siphon stenosis. Mild motion artifact at the carotid terminus level. MCA and ACA origins appear normal. The right ACA A1 segment appears dominant. Anterior communicating artery, visible bilateral ACA branches, MCA M1 segments, MCA bi/trifurcations, and visible bilateral MCA branches appear within normal limits. IMPRESSION: 1. Negative neck MRA; artifactual signal loss suspected at the left ICA origin. 2. Negative intracranial MRA; mildly motion degraded. Electronically Signed   By: Odessa Fleming M.D.   On: 12/25/2017 15:44   Mr Maxine Glenn Neck W Wo Contrast  Result Date: 12/25/2017 CLINICAL DATA:  62 year old male with increased generalized muscle weakness. EXAM: MRA HEAD WITHOUT CONTRAST MRA NECK WITHOUT AND WITH CONTRAST TECHNIQUE: Angiographic images of the Circle of Willis were obtained using MRA technique without intravenous contrast. Angiographic images of the neck were obtained using MRA technique without and with intravenous contrast. Carotid stenosis measurements (when applicable) are obtained utilizing NASCET criteria, using the distal internal carotid diameter as the denominator. CONTRAST:  18mL MULTIHANCE GADOBENATE DIMEGLUMINE 529 MG/ML IV SOLN COMPARISON:  None. FINDINGS: MRA NECK FINDINGS Precontrast time-of-flight images reveal antegrade flow signal in the bilateral carotid and vertebral arteries from the thoracic inlet to the skull base. Post-contrast neck MRA images reveal a 3 vessel arch configuration with normal great vessel origins. Normal right CCA and right carotid bifurcation. Normal cervical right ICA. Normal left CCA. The left carotid bifurcation seems to be obscured by artifact, but there is preserved enhancement of the proximal left ICA. The mid and distal cervical left ICA appear normal. Both  vertebral artery origins and cervical vertebral arteries are normal, the right is mildly dominant. MRA HEAD FINDINGS This study is intermittently degraded by motion artifact despite repeated imaging attempts. No intracranial mass effect or ventriculomegaly. Grossly normal cerebral morphology. Antegrade flow in the posterior circulation with mildly dominant distal right vertebral artery. Normal PICA origins and vertebrobasilar junction. Normal basilar artery. Patent SCA and PCA origins. Mild motion artifact at the PCA levels, but PCA branches appear within normal limits. Posterior communicating arteries are diminutive or absent. Antegrade flow in both ICA siphons. No siphon stenosis. Mild motion artifact at the carotid terminus level. MCA and ACA origins appear normal. The right ACA A1 segment appears dominant. Anterior communicating artery, visible bilateral ACA branches, MCA M1 segments, MCA bi/trifurcations, and visible bilateral MCA branches appear within normal limits. IMPRESSION: 1. Negative neck MRA; artifactual signal loss suspected at the left ICA origin. 2. Negative intracranial MRA; mildly motion degraded. Electronically Signed   By: Odessa Fleming M.D.   On: 12/25/2017 15:44   Initial Impression / Assessment and Plan / ED Course  I have reviewed the triage vital signs and the nursing notes.  Pertinent labs & imaging results that were available during my care of the patient were reviewed by me and considered in my medical decision making (see chart for details).     I discussed case with Dr. Anne Hahn, neurologist who is seen him this month.  Suggested MRA of head and neck.  He feels that EEG can be done as outpatient 4:20 PM patient resting comfortably.  No distress.  He has been up and walking to the bathroom several times during his emergency department stay. No signs of acute encephalopathy.  He has no asterixis. Dr. Anne Hahn reports to me that he will follow up with patient as outpatient.  He he  should have  his blood pressure repeated within the next 3 weeks Final Clinical Impressions(s) / ED Diagnoses  Diagnosis #1 generalized weakness #2 mild thrombocytopenia #3 elevated blood pressure Final diagnoses:  None    ED Discharge Orders    None       Doug Sou, MD 12/25/17 1635

## 2017-12-25 NOTE — Progress Notes (Signed)
[facility] insisted and took his bone stimulator with him to [facility].

## 2017-12-25 NOTE — Plan of Care (Signed)
  Safety: Periods of time without injury will increase 12/25/2017 2157 - Progressing by Delos HaringPhillips, Arnez A, RN Note Pt safe on the unit at this time

## 2017-12-26 DIAGNOSIS — F19988 Other psychoactive substance use, unspecified with other psychoactive substance-induced disorder: Secondary | ICD-10-CM

## 2017-12-26 DIAGNOSIS — F3160 Bipolar disorder, current episode mixed, unspecified: Secondary | ICD-10-CM

## 2017-12-26 DIAGNOSIS — M62838 Other muscle spasm: Secondary | ICD-10-CM

## 2017-12-26 DIAGNOSIS — F4310 Post-traumatic stress disorder, unspecified: Secondary | ICD-10-CM

## 2017-12-26 MED ORDER — METOPROLOL SUCCINATE ER 100 MG PO TB24: 100.0000 mg | ORAL_TABLET | Freq: Every day | ORAL | Status: AC

## 2017-12-26 MED ORDER — ROSUVASTATIN CALCIUM 10 MG PO TABS: 10.0000 mg | ORAL_TABLET | Freq: Every day | ORAL | Status: DC

## 2017-12-26 NOTE — Progress Notes (Signed)
Nursing 1:1 note D:Pt observed sleeping in bed with eyes closed. RR even and unlabored. No distress noted. A: 1:1 observation continues for safety  R: pt remains safe  

## 2017-12-26 NOTE — Progress Notes (Signed)
Pt was informed that he was to wear the gait belt when ambulating, pt refused, pt was informed that that was what needs to happen for his safety. Pt continued to refuse and stated he was not going to wear the gait belt.

## 2017-12-26 NOTE — Progress Notes (Deleted)
  [facility] :  Will you be returning to the same living situation after discharge:  Yes,  Home  At discharge, do you have transportation home?: Yes,  Public transportation  Do you have the ability to pay for your medications: Yes,  Insurance   Release of information consent forms completed and in the chart;  Patient's signature needed at discharge.  Patient to Follow up at: Follow-up Information    [facility] Follow [facility].   Why:  CSW called to ask for a hospital follow up appointment-left message with your phone number for them to call you back on Monday.  Call them if you have not heard back by monday afternoon. Contact information: [address] [place]  [phone]          Next level of care provider has access to [facility] Link:no  Safety Planning and Suicide Prevention discussed: Yes,  Yes  Have you used any form of tobacco in the last 30 days? (Cigarettes, Smokeless Tobacco, Cigars, and/or Pipes): Yes  Has patient been referred to the Quitline?: Patient refused referral  Patient has been referred for addiction treatment: N/A  Aram BeechamAngel M Chelbi Herber, Student-Social Work 12/26/2017, 10:04 AM

## 2017-12-26 NOTE — Discharge Summary (Signed)
Physician Discharge Summary Note  Patient:  Raymond Melton is an 62 y.o., male  MRN:  [id number]  DOB:  02/26/1956  Patient phone:  [phone] (home)   Patient address:   [address] [place] [address],   Total Time spent with patient: Greater than 30 minutes  Date of Admission:  12/23/2017  Date of Discharge: 12-26-17  Reason for Admission: Irritable mood, worsening depression, memory lapse.   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 PTSD (post-traumatic stress disorder) [F43.10]   . Bipolar affective disorder, current episode mixed (HCC) [F31.60]   . Altered mental status [R41.82] 12/17/2017  . Opioid use disorder, severe, dependence (HCC) [F11.20] 11/26/2016  . History of neck surgery [Z98.890] 11/26/2016  . Hx of hepatitis C [Z86.19] 11/26/2016  . Hypertension [I10] 11/26/2016  . Diabetes (HCC) [E11.9] 11/26/2016  . MDD (major depressive disorder), recurrent severe, without psychosis (HCC) [F33.2] 11/25/2016   Past Psychiatric History: Opioid use disorder, dependence, MDD  Past Medical History:  Past Medical History:  Diagnosis Date  . Alcohol abuse   . Altered mental status 12/17/2017  . Chronic pain   . Diabetes mellitus without complication (HCC)   . Hepatitis C   . Hypertension   . Immune deficiency disorder (HCC)   . Kidney stones   . TB lung, latent     Past Surgical History:  Procedure Laterality Date  . ABDOMINAL SURGERY    . ANKLE SURGERY    . CHOLECYSTECTOMY    . ELBOW SURGERY    . EYE SURGERY    . HERNIA REPAIR    . WRIST SURGERY     Family History:  Family History  Problem Relation Age of Onset  . Depression Mother   .  Hypertension Mother   . Leukemia Mother   . Stroke Father   . Hypertension Father   . Cancer Sister   . Hypertension Sister   . Cancer Brother        bladder  . Hypertension Brother    Family Psychiatric  History: See H&P  Social History:  Social History   Substance and Sexual Activity  Alcohol Use Yes   Comment: daily     Social History   Substance and Sexual Activity  Drug Use No    Social History   Socioeconomic History  . Marital status: Married    Spouse name: None  . Number of children: None  . Years of education: None  . Highest education level: None  Social Needs  . Financial resource strain: None  . Food insecurity - worry: None  . Food insecurity - inability: None  . Transportation needs - medical: None  . Transportation needs - non-medical: None  Occupational History  . None  Tobacco Use  . Smoking status: Current Some Day Smoker    Packs/day: 0.50    Types: Cigarettes  . Smokeless tobacco: Never Used  Substance and Sexual Activity  . Alcohol use: Yes    Comment: daily  . Drug use: No  . Sexual activity: Not Currently  Other Topics Concern  . None  Social History Narrative  . None   Melton Course: (Per Md's discharge SRA): 62 y.o Caucasian male, separated, lives alone, unemployed. Background history of SUD and mood disorder. Presented to  the ER unaccompanied. Says his wife encouraged him to seek help. Reported to be very irritable lately. He has been very argumentative with his wife. He has been expressing increasing depression. He had expressed suicidal thoughts. Patient overdosed on his heart medications a month ago. Routine labs is within normal limits. Toxicology is negative. UDS is positive for Bnezodiazepines. BAL<10 mg/dl. Recent out patient provider note indicates he has been having intermittent confusion. He has been testing positive for benzodiazepines despite not having this prescribed anymore. At interview, patient reports that he has  been losing track of time. Says he is blacking out. He has been doing things he cannot remember. 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   Upon his admission & initiation of his treatment regimen, Raymond Melton started refusing all medications aside from alprazolam on admission, Raymond he was only prescribed PRN medications after initial evaluation. He was placed on 1:1 observation due to fall risk.He was also moved from the 400 unit to the 500-hall. Pt remained drowsy, and reported increasing weakness in his upper body.  During the course of his hospitalization, Raymond Melton was sent out to ED due to severe weakness, and he was medically cleared for return to [facility]. Upon return to [facility] yesterday evening, RN staff observed pt drop a unknown white tablet on the floor in the hallway, and then quickly grab it from the floor. Concern was raised for pt to have contraband medication, and RN staff inspected pt's nerve stimulator machine. Within the battery compartment, RN staff discovered a carved out spaced which contained multiple tablets of xanaflex. RN staff confiscated the device, and pt acknowledged that he had been abusing xanaflex during his admission.  Upon evaluation this AM, pt is alert and oriented. He denies SI/HI/AH/VH. Discussed with patient about confiscation of contraband xanaflex, and he states, "I'm prescribed them, Raymond I was taking them." Discussed with patient about risks of misuse of prescription medications, especially sedating medications in combination with other medications, and pt verbalized good understanding. Pt stated that he would like to be discharged, sharing, "My wife moved back in; I'm ready to go." Pt would not like to take medications after discharge, and he is not likely to  continue taking psychotropic medications after discharge. He was able to engage in safety planning including plan to return to Raymond Melton or contact emergency services if he feels unable to maintain his own safety or the safety of others. Pt had no further questions, comments, or concerns.  Raymond Melton left Raymond Melton with all personal belongings in no apparent distress.  Physical Findings: AIMS: Facial and Oral Movements Muscles of Facial Expression: None, normal Lips and Perioral Area: None, normal Jaw: None, normal Tongue: None, normal,Extremity Movements Upper (arms, wrists, hands, fingers): None, normal Lower (legs, knees, ankles, toes): None, normal, Trunk Movements Neck, shoulders, hips: None, normal, Overall Severity Severity of abnormal movements (highest score from questions above): None, normal Incapacitation due to abnormal movements: None, normal Patient's awareness of abnormal movements (rate only patient's report): No Awareness, Dental Status Current problems with teeth and/or dentures?: No Does patient usually wear dentures?: No  CIWA:  CIWA-Ar Total: 1 COWS:  COWS Total Score: 1  Musculoskeletal: Strength & Muscle Tone: within normal limits Gait & Station: normal  Patient leans: N/A  Psychiatric Specialty Exam: Physical Exam  Constitutional: He appears well-developed.  HENT:  Head: Normocephalic.  Eyes: Pupils are equal, round, and reactive to light.  Neck: Normal range of motion.  Cardiovascular: Normal rate.  Respiratory: Effort normal.  GI: Soft.  Genitourinary:  Genitourinary Comments: Denies any issues in this area  Musculoskeletal: Normal range of motion.  Neurological: He is alert.  Skin: Skin is warm.    Review of Systems  Constitutional: Negative.   HENT: Negative.   Eyes: Negative.   Cardiovascular: Negative.   Gastrointestinal: Negative.   Genitourinary: Negative.   Musculoskeletal: Negative.   Skin: Negative.   Neurological: Negative.    Endo/Heme/Allergies: Negative.   Psychiatric/Behavioral: Positive for depression (Stable) and substance abuse (Hx. Opioid use disorder, chronic). Negative for hallucinations, memory loss and suicidal ideas. The patient has insomnia (Stable). The patient is not nervous/anxious.     Blood pressure (!) 141/78, pulse 71, temperature 97.8 F (36.6 C), resp. rate 18, height 5' 11.65" (1.82 m), weight 72 kg (158 lb 11.7 oz), SpO2 99 %.Body mass index is 21.74 kg/m.  See Md's SRA  Have you used any form of tobacco in the last 30 days? (Cigarettes, Smokeless Tobacco, Cigars, and/or Pipes): Yes  Has this patient used any form of tobacco in the last 30 days? (Cigarettes, Smokeless Tobacco, Cigars, and/or Pipes):Yes, provided with nicorette gum prescription.  Blood Alcohol level:  Lab Results  Component Value Date   ETH <10 12/22/2017   ETH <11 02/15/2013   Metabolic Disorder Labs:  Lab Results  Component Value Date   HGBA1C 6.0 (H) 11/27/2016   MPG 126 11/27/2016   No results found for: PROLACTIN No results found for: CHOL, TRIG, HDL, CHOLHDL, VLDL, LDLCALC  See Psychiatric Specialty Exam and Suicide Risk Assessment completed by Attending Physician prior to discharge.  Discharge destination:  Home  Is patient on multiple antipsychotic therapies at discharge:  No   Has Patient had three or more failed trials of antipsychotic monotherapy by history:  No  Recommended Plan for Multiple Antipsychotic Therapies: NA  Allergies as of 12/26/2017      Reactions   Contrast Media [iodinated Diagnostic Agents] Anaphylaxis, Swelling   Ioxaglate Anaphylaxis, Swelling   Metrizamide Anaphylaxis, Swelling   Other Anaphylaxis, Shortness Of Breath, Swelling   Iv dye   Codeine Itching   Other reaction(s): Other (See Comments) restless   Iohexol Other (See Comments)   Latex    Lisinopril    Tuberculin    Other reaction(s): Other (See Comments) Severe skin reaction   Tuberculin Tests Other (See  Comments)   Severe skin reaction   Valsartan    Zolpidem Other (See Comments)   confusion      Medication List    STOP taking these medications   ALPRAZolam 1 MG tablet Commonly known as:  XANAX   ergocalciferol 50000 units capsule Commonly known as:  VITAMIN D2   promethazine 25 MG tablet Commonly known as:  PHENERGAN   tiZANidine 4 MG tablet Commonly known as:  ZANAFLEX     TAKE these medications     Indication  metoprolol succinate 100 MG 24 hr tablet Commonly known as:  TOPROL-XL Take 1 tablet (100 mg total) by mouth daily. Take with or immediately following a meal: For high blood pressure  Indication:  High Blood Pressure Disorder   rosuvastatin 10 MG tablet Commonly known as:  CRESTOR Take 1 tablet (10 mg total) by mouth daily. For high cholesterol Start  taking on:  12/27/2017 What changed:  additional instructions  Indication:  Inherited Homozygous Hypercholesterolemia      Follow-up Information    [facility] Follow up.   Specialty:  Psychology Why:  Referral faxed: 1/29 Contact information: [address] [place] [address] [phone]        [facility] Follow up.   Contact information: [address]  [place], [place] [address] Phone: [phone] Fax: [phone]         Follow-up recommendations: Activity:  As tolerated Diet: As recommended by your primary care doctor. Keep all scheduled follow-up appointments as recommended.   Comments: Patient is instructed prior to discharge to: Take all medications as prescribed by his/her mental healthcare provider. Report any adverse effects and or reactions from the medicines to his/her outpatient provider promptly. Patient has been instructed & cautioned: To not engage in alcohol and or illegal drug use while on prescription medicines. In the event of worsening symptoms, patient is instructed to call the crisis hotline, 911 and or go to the nearest ED for  appropriate evaluation and treatment of symptoms. To follow-up with his/her primary care provider for your other medical issues, concerns and or health care needs.   Signed: Armandina StammerAgnes Nwoko, NP, PMHNP, FNP-BC 12/26/2017, 9:54 AM   Patient seen, Suicide Assessment Completed.  Disposition Plan Reviewed   Raymond BlancoMichael Corredor is a 62 y/o M with history of unspecified bipolar disorder and self-reported history of PTSD who was admitted voluntarily with worsening depression, SI without plan, anxiety, and recent suicide attempt via overdose about 1 month ago. Pt also reported worsened PTSD symptoms such as nightmares, irritability, hypervigilance, and mood lability. Pt was refusing all medications aside from alprazolam on admission, Raymond he was only prescribed PRN medications after initial evaluation. He was placed on 1:1 observation due to fall risk.Pt remained drowsy, and reported increasing weakness in his upper body.  Pt was sent out to ED due to severe weakness, and [facility]. [facility], RN staff observed pt drop a unknown white tablet on the floor in the hallway, and then quickly grab it from the floor. Concern was raised for pt to have contraband medication, and RN staff inspected pt's nerve stimulator machine. Within the battery compartment, RN staff discovered a carved out spaced which contained multiple tablets of xanaflex. RN staff confiscated the device, and pt acknowledged that he had been abusing xanaflex during his admission.  Upon evaluation this AM, pt is alert and oriented. He denies SI/HI/AH/VH. Discussed with patient about confiscation of contraband xanaflex, and he states, "I'm prescribed them, Raymond I was taking them." Discussed with patient about risks of misuse of prescription medications, especially sedating medications in combination with other medications, and pt verbalized good understanding. Pt stated that he would like to discharge,  sharing, "My wife moved back in; I'm ready to go." Pt would not like to take medications after discharge, and he is not likely to continue taking psychotropic medications after discharge. He was able to engage in safety planning including plan to [facility] or contact emergency services if he feels unable to maintain his own safety or the safety of others. Pt had no further questions, comments, or concerns.   Plan Of Care/Follow-up recommendations:    - Discharge to outpatient level of care  - Bipolar disorder unspecified / PTSD - Discontinue Abilify 10mg  po qDay - Discontinue zoloft 50mg  po qDay  -Anxiety -  Discontinue gabapentin 300mg  po TID  - Benzodiazepine withdrawal - Discontinue CIWA  - HLD - Continue rosuvastatin 10mg  po qDay  - HTN - Continue Toprol-XL 100mg  qDay  - Muscle Spasms             - Discontinue xanaflex due to misuse  Activity:  as tolerated Diet:  normal Tests:  NA Other:  see above for DC plan  Micheal Likens, MD

## 2017-12-26 NOTE — Progress Notes (Signed)
Pt came to the nursing station insisting that his clothes were on the top floor of the other hospital in some plastic bags. Pt stated he took the clothes over there from here yesterday when he went to the hospital.

## 2017-12-26 NOTE — [facility] Suicide Risk Assessment (Signed)
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 Altered mental status [R41.82] 12/17/2017  . Opioid use disorder, severe, dependence (HCC) [F11.20] 11/26/2016  . History of neck surgery [Z98.890] 11/26/2016  . Hx of hepatitis C [Z86.19] 11/26/2016  . Hypertension [I10] 11/26/2016  . Diabetes (HCC) [E11.9] 11/26/2016  . MDD (major depressive disorder), recurrent severe, without psychosis (HCC) [F33.2] 11/25/2016    Total Time spent with patient: 30 minutes  Musculoskeletal: Strength & Muscle Tone: within normal limits Gait & Station: normal Patient leans: N/A  Psychiatric Specialty Exam: Review of Systems  Constitutional: Negative for chills and fever.  Respiratory: Negative for cough.   Cardiovascular: Negative for chest pain.  Gastrointestinal: Negative for heartburn and nausea.  Psychiatric/Behavioral: Positive for substance abuse. Negative for depression, hallucinations and suicidal ideas. The patient is not nervous/anxious.     Blood pressure (!) 141/78, pulse 71, temperature 97.8 F (36.6 C), resp. rate 18, height 5' 11.65" (1.82 m), weight 72 kg (158 lb 11.7 oz), SpO2 99 %.Body mass index is 21.74 kg/m.  General Appearance: Casual and Disheveled  Eye Contact::  Good  Speech:  Clear and Coherent and Normal Rate  Volume:  Normal  Mood:  Euthymic  Affect:  Appropriate, Congruent and Constricted  Thought Process:  Coherent and Goal Directed  Orientation:  Full (Time, Place, and Person)  Thought Content:  Logical  Suicidal Thoughts:  No  Homicidal Thoughts:  No  Memory:  Immediate;   Fair Recent;   Fair Remote;   Fair  Judgement:  Poor  Insight:  Lacking   Psychomotor Activity:  Normal  Concentration:  Fair  Recall:  [employer] of Knowledge:Fair  Language: Fair  Akathisia:  No  Handed:    AIMS (if indicated):     Assets:  Financial Resources/Insurance Resilience Social Support Transportation  Sleep:  Number of Hours: 4.75  Cognition: WNL  ADL's:  Intact   Mental Status Per Nursing Assessment::   On Admission:     Demographic Factors:  Male and Caucasian  Loss Factors: NA  Historical Factors: Impulsivity  Risk Reduction Factors:   Positive social support, Positive therapeutic relationship and Positive coping skills or problem solving skills  Continued Clinical Symptoms:  Alcohol/Substance Abuse/Dependencies  Cognitive Features That Contribute To Risk:  None    Suicide Risk:  Minimal: No identifiable suicidal ideation.  Patients presenting with no risk factors but with morbid ruminations; may be classified as minimal risk based on the severity of the depressive symptoms  Follow-up Information    [facility] Follow up.   Specialty:  Psychology Why:  Referral faxed: 1/29 Contact information: [address] [place] [address] [phone]         [facility]   Contact information: [address] [place], [place] [address] Phone: [phone] Fax: [phone]          Subjective Data:  Raymond Melton is a 62 y/o M with history of unspecified bipolar disorder and self-reported history of PTSD who was admitted voluntarily with worsening depression, SI without plan, anxiety, and recent suicide attempt via overdose about 1 month ago. Pt also reported worsened PTSD symptoms such as nightmares, irritability, hypervigilance, and mood lability. Pt was refusing all medications aside from  alprazolam on admission, so he was only prescribed PRN medications after initial evaluation. He was placed on 1:1 observation due to fall risk. Pt remained drowsy, and reported increasing  weakness in his upper body.  Pt was sent out to ED due to severe weakness, and he was [facility]. [facility], RN staff observed pt drop a unknown white tablet on the floor in the hallway, and then quickly grab it from the floor. Concern was raised for pt to have contraband medication, and RN staff inspected pt's nerve stimulator machine. Within the battery compartment, RN staff discovered a carved out spaced which contained multiple tablets of xanaflex. RN staff confiscated the device, and pt acknowledged that he had been abusing xanaflex during his admission.  Upon evaluation this AM, pt is alert and oriented. He denies SI/HI/AH/VH. Discussed with patient about confiscation of contraband xanaflex, and he states, "I'm prescribed them, so I was taking them." Discussed with patient about risks of misuse of prescription medications, especially sedating medications in combination with other medications, and pt verbalized good understanding. Pt stated that he would like to discharge, sharing, "My wife moved back in; I'm ready to go." Pt would not like to take medications after discharge, and he is not likely to continue taking psychotropic medications after discharge. He was able to engage in safety planning including plan to [facility] or contact emergency services if he feels unable to maintain his own safety or the safety of others. Pt had no further questions, comments, or concerns.    Plan Of Care/Follow-up recommendations:    - Discharge to outpatient level of care  - Bipolar disorder unspecified / PTSD             - Discontinue Abilify 10mg  po qDay             - Discontinue zoloft 50mg  po qDay  -Anxiety             - Discontinue gabapentin 300mg  po TID  - Benzodiazepine withdrawal             - Discontinue CIWA  - HLD              - Continue rosuvastatin 10mg  po qDay  - HTN             - Continue Toprol-XL 100mg  qDay  - Muscle  Spasms   - Discontinue xanaflex due to misuse  Activity:  as tolerated Diet:  normal Tests:  NA Other:  see above for DC plan  Raymond Likenshristopher T Xiomar Crompton, MD 12/26/2017, 8:16 AM

## 2017-12-26 NOTE — Progress Notes (Signed)
Patient ID: Raymond Melton, male   DOB: 03-19-1956, 62 y.o.   MRN: [id number] Patient discharged to home/self care on his own.  Patient acknowledged all discharge instructions and receipt of all personal belongings.

## 2017-12-26 NOTE — Progress Notes (Signed)
  [facility] Discharge [facility] :  Will you be returning to the same living situation after discharge:  Yes,  home At discharge, do you have transportation home?: Yes,  personal car Do you have the ability to pay for your medications: Yes,  sent on no meds  Release of information consent forms completed and in the chart;  Patient's signature needed at discharge.  Patient to Follow up at: Follow-up Information    [facility] Follow up.   Why:  CSW called to ask for a hospital follow up appointment-left message with your phone number for them to call you back on Monday.  Call them if you have not heard back by monday afternoon. Contact information: [address] [place]  [phone]          Next level of care provider has access to [facility]:no  Safety Planning and Suicide Prevention discussed: Yes,  yes  Have you used any form of tobacco in the last 30 days? (Cigarettes, Smokeless Tobacco, Cigars, and/or Pipes): Yes  Has patient been referred to the Quitline?: Patient refused referral  Patient has been referred for addiction treatment: Pt. refused referral  Ida RogueRodney B Maebry Obrien, LCSW 12/26/2017, 10:04 AM

## 2017-12-29 ENCOUNTER — Other Ambulatory Visit: Payer: Medicare Other

## 2017-12-30 ENCOUNTER — Encounter: Payer: Self-pay | Admitting: Neurology

## 2017-12-31 ENCOUNTER — Telehealth: Payer: Self-pay | Admitting: Neurology

## 2017-12-31 ENCOUNTER — Ambulatory Visit
Admission: RE | Admit: 2017-12-31 | Discharge: 2017-12-31 | Disposition: A | Discharge status: Home / Self Care | Payer: Medicare Other | Source: Ambulatory Visit | Attending: Neurology | Admitting: Neurology

## 2017-12-31 DIAGNOSIS — R404 Transient alteration of awareness: Secondary | ICD-10-CM

## 2017-12-31 NOTE — Telephone Encounter (Signed)
Transfer imaging called, the patient apparently has gone to the emergency room several days ago, MRA of the head and neck were already done.  The studies today will be canceled.

## 2018-01-01 ENCOUNTER — Telehealth: Payer: Self-pay | Admitting: Neurology

## 2018-01-01 DIAGNOSIS — R404 Transient alteration of awareness: Secondary | ICD-10-CM

## 2018-01-01 NOTE — Telephone Encounter (Signed)
Pts wife calling wanting to speak with Dr. Anne HahnWillis or RN to discuss the results of pts MRA when available

## 2018-01-01 NOTE — Telephone Encounter (Signed)
I called the wife.  The patient had MRI angiogram of the head and neck that was normal.  He has not yet has EEG study.  When he had the MRI angiogram in the emergency room, he had been admitted to behavioral health, he had another event of slurred speech then.  He went into behavioral health for suicidal ideation.  We will reorder the EEG study and try to get this done.

## 2018-01-06 ENCOUNTER — Telehealth: Payer: Self-pay | Admitting: Neurology

## 2018-01-06 ENCOUNTER — Ambulatory Visit (INDEPENDENT_AMBULATORY_CARE_PROVIDER_SITE_OTHER): Payer: Medicare Other | Admitting: Neurology

## 2018-01-06 DIAGNOSIS — R404 Transient alteration of awareness: Secondary | ICD-10-CM

## 2018-01-06 DIAGNOSIS — R4182 Altered mental status, unspecified: Secondary | ICD-10-CM | POA: Diagnosis not present

## 2018-01-06 NOTE — Telephone Encounter (Signed)
I tried to call the patient regarding the EEG study.  Unable to leave a message.  Will call back tomorrow.  The EEG study was normal.

## 2018-01-06 NOTE — Procedures (Signed)
    History:  Raymond BlancoMichael Suess is a 62 year old patient with a history of diabetes, hypertension, and hepatitis C.  The patient underwent cervical spine surgery 1 year ago and had a sudden change in mental status following the surgery.  The patient believes that there has been a permanent change in cognitive capacity since that time.  The patient will have sudden events of slurred speech and gait instability that will come and go.  He is being evaluated for these events.  This is a routine EEG.  No skull defects are noted.  Medications include Norvasc, vitamin D, vitamin B12, and Toprol.  EEG classification: Normal awake  Description of the recording: The background rhythms of this recording consists of a fairly well modulated medium amplitude alpha rhythm of 9 Hz that is reactive to eye opening and closure. As the record progresses, the patient appears to remain in the waking state throughout the recording. Photic stimulation was performed, resulting in a bilateral and symmetric photic driving response. Hyperventilation was also performed, resulting in a minimal buildup of the background rhythm activities without significant slowing seen. At no time during the recording does there appear to be evidence of spike or spike wave discharges or evidence of focal slowing. EKG monitor shows no evidence of cardiac rhythm abnormalities with a heart rate of 90.  Impression: This is a normal EEG recording in the waking state. No evidence of ictal or interictal discharges are seen.

## 2018-01-07 MED ORDER — TOPIRAMATE 25 MG PO TABS: ORAL_TABLET | ORAL | 3 refills | Status: DC

## 2018-01-07 NOTE — Telephone Encounter (Signed)
Dr. Anne HahnWillis- are you okay with working him in sooner for headaches?

## 2018-01-07 NOTE — Telephone Encounter (Signed)
Called and spoke with patient about normal EEG. He verbalized understanding.  He has a f/u 03/17/18 but wondering if he can come in sooner to discuss headaches he is having. Advised I will check with Dr. Anne HahnWillis and call him back.

## 2018-01-07 NOTE — Telephone Encounter (Signed)
I called the patient.  We will initiate treatment with Topamax for the headaches and for the episodes of confusion.  He will contact me if he needs any dose adjustments.

## 2018-01-07 NOTE — Addendum Note (Signed)
Addended by: York SpanielWILLIS, CHARLES K on: 01/07/2018 05:03 PM   Modules accepted: Orders

## 2018-01-22 ENCOUNTER — Other Ambulatory Visit: Payer: Self-pay | Admitting: Neurosurgery

## 2018-01-27 ENCOUNTER — Telehealth: Payer: Self-pay | Admitting: Neurology

## 2018-01-27 NOTE — Telephone Encounter (Signed)
I called the wife.  The patient has had problems with feeling drunk, staggery, slurring speech, confused since 24 January 2018.  I [facility] the [facility] and the patient picked up a prescription for 1 mg Xanax tablets on 1 March, he is abusing benzodiazepine medications.  The reason for the episodes of altered mental status is secondary to benzodiazepine abuse.  I discussed this with the wife.

## 2018-01-27 NOTE — Telephone Encounter (Signed)
Patient's wife is calling stating for the last almost 4 days the patient has been having episodes of confusion and slurring speech. Please call and discuss.

## 2018-01-29 ENCOUNTER — Ambulatory Visit: Payer: Medicare Other | Admitting: Neurology

## 2018-02-03 DIAGNOSIS — R079 Chest pain, unspecified: Secondary | ICD-10-CM | POA: Diagnosis not present

## 2018-02-04 ENCOUNTER — Inpatient Hospital Stay (HOSPITAL_COMMUNITY)
Admission: AD | Admit: 2018-02-04 | Discharge: 2018-02-06 | DRG: 247 | Disposition: A | Discharge status: Home / Self Care | Payer: Medicare Other | Source: Other Acute Inpatient Hospital | Attending: Cardiovascular Disease | Admitting: Cardiovascular Disease

## 2018-02-04 ENCOUNTER — Encounter (HOSPITAL_COMMUNITY): Payer: Self-pay | Admitting: Cardiology

## 2018-02-04 DIAGNOSIS — I2510 Atherosclerotic heart disease of native coronary artery without angina pectoris: Secondary | ICD-10-CM | POA: Diagnosis present

## 2018-02-04 DIAGNOSIS — Z9889 Other specified postprocedural states: Secondary | ICD-10-CM

## 2018-02-04 DIAGNOSIS — Z9049 Acquired absence of other specified parts of digestive tract: Secondary | ICD-10-CM | POA: Diagnosis not present

## 2018-02-04 DIAGNOSIS — F1010 Alcohol abuse, uncomplicated: Secondary | ICD-10-CM | POA: Diagnosis present

## 2018-02-04 DIAGNOSIS — I700 Atherosclerosis of aorta: Secondary | ICD-10-CM

## 2018-02-04 DIAGNOSIS — Z8249 Family history of ischemic heart disease and other diseases of the circulatory system: Secondary | ICD-10-CM | POA: Diagnosis not present

## 2018-02-04 DIAGNOSIS — F1910 Other psychoactive substance abuse, uncomplicated: Secondary | ICD-10-CM | POA: Diagnosis present

## 2018-02-04 DIAGNOSIS — F3160 Bipolar disorder, current episode mixed, unspecified: Secondary | ICD-10-CM | POA: Diagnosis present

## 2018-02-04 DIAGNOSIS — Z91041 Radiographic dye allergy status: Secondary | ICD-10-CM

## 2018-02-04 DIAGNOSIS — I25750 Atherosclerosis of native coronary artery of transplanted heart with unstable angina: Secondary | ICD-10-CM | POA: Diagnosis present

## 2018-02-04 DIAGNOSIS — R079 Chest pain, unspecified: Secondary | ICD-10-CM | POA: Diagnosis present

## 2018-02-04 DIAGNOSIS — E119 Type 2 diabetes mellitus without complications: Secondary | ICD-10-CM | POA: Diagnosis present

## 2018-02-04 DIAGNOSIS — G894 Chronic pain syndrome: Secondary | ICD-10-CM | POA: Diagnosis present

## 2018-02-04 DIAGNOSIS — I209 Angina pectoris, unspecified: Secondary | ICD-10-CM | POA: Diagnosis present

## 2018-02-04 DIAGNOSIS — B1920 Unspecified viral hepatitis C without hepatic coma: Secondary | ICD-10-CM | POA: Diagnosis not present

## 2018-02-04 DIAGNOSIS — I119 Hypertensive heart disease without heart failure: Principal | ICD-10-CM | POA: Diagnosis present

## 2018-02-04 DIAGNOSIS — F1310 Sedative, hypnotic or anxiolytic abuse, uncomplicated: Secondary | ICD-10-CM | POA: Diagnosis not present

## 2018-02-04 DIAGNOSIS — R7611 Nonspecific reaction to tuberculin skin test without active tuberculosis: Secondary | ICD-10-CM | POA: Diagnosis present

## 2018-02-04 DIAGNOSIS — T508X5A Adverse effect of diagnostic agents, initial encounter: Secondary | ICD-10-CM | POA: Diagnosis present

## 2018-02-04 DIAGNOSIS — F1120 Opioid dependence, uncomplicated: Secondary | ICD-10-CM | POA: Diagnosis not present

## 2018-02-04 DIAGNOSIS — G8929 Other chronic pain: Secondary | ICD-10-CM | POA: Diagnosis present

## 2018-02-04 DIAGNOSIS — Z8619 Personal history of other infectious and parasitic diseases: Secondary | ICD-10-CM | POA: Diagnosis present

## 2018-02-04 DIAGNOSIS — Z91040 Latex allergy status: Secondary | ICD-10-CM | POA: Diagnosis not present

## 2018-02-04 DIAGNOSIS — Z888 Allergy status to other drugs, medicaments and biological substances status: Secondary | ICD-10-CM

## 2018-02-04 DIAGNOSIS — F332 Major depressive disorder, recurrent severe without psychotic features: Secondary | ICD-10-CM | POA: Diagnosis present

## 2018-02-04 DIAGNOSIS — Z79899 Other long term (current) drug therapy: Secondary | ICD-10-CM

## 2018-02-04 DIAGNOSIS — I10 Essential (primary) hypertension: Secondary | ICD-10-CM

## 2018-02-04 DIAGNOSIS — Z87442 Personal history of urinary calculi: Secondary | ICD-10-CM

## 2018-02-04 DIAGNOSIS — I200 Unstable angina: Secondary | ICD-10-CM | POA: Diagnosis not present

## 2018-02-04 DIAGNOSIS — E785 Hyperlipidemia, unspecified: Secondary | ICD-10-CM | POA: Diagnosis present

## 2018-02-04 DIAGNOSIS — Z955 Presence of coronary angioplasty implant and graft: Secondary | ICD-10-CM

## 2018-02-04 DIAGNOSIS — R9431 Abnormal electrocardiogram [ECG] [EKG]: Secondary | ICD-10-CM | POA: Diagnosis not present

## 2018-02-04 LAB — HEPARIN LEVEL (UNFRACTIONATED): Heparin Unfractionated: 0.28 IU/mL — ABNORMAL LOW (ref 0.30–0.70)

## 2018-02-04 MED ORDER — DIPHENHYDRAMINE HCL 50 MG/ML IJ SOLN: 50.0000 mg | Freq: Once | INTRAMUSCULAR | Status: AC

## 2018-02-04 MED ORDER — MORPHINE SULFATE (PF) 4 MG/ML IV SOLN
4.0000 mg | INTRAVENOUS | Status: DC | PRN
  Administered 2018-02-04 – 2018-02-06 (×11): 4 mg via INTRAVENOUS
  Filled 2018-02-04 (×12): qty 1

## 2018-02-04 MED ORDER — ACETAMINOPHEN 325 MG PO TABS: 650.0000 mg | ORAL_TABLET | ORAL | Status: DC | PRN

## 2018-02-04 MED ORDER — ASPIRIN EC 81 MG PO TBEC
81.0000 mg | DELAYED_RELEASE_TABLET | Freq: Every day | ORAL | Status: DC
  Administered 2018-02-06: 81 mg via ORAL
  Filled 2018-02-04: qty 1

## 2018-02-04 MED ORDER — TIZANIDINE HCL 4 MG PO TABS
4.0000 mg | ORAL_TABLET | Freq: Three times a day (TID) | ORAL | Status: DC | PRN
  Administered 2018-02-04 – 2018-02-06 (×5): 4 mg via ORAL
  Filled 2018-02-04 (×6): qty 1

## 2018-02-04 MED ORDER — NITROGLYCERIN IN D5W 200-5 MCG/ML-% IV SOLN
0.0000 ug/min | INTRAVENOUS | Status: DC
  Administered 2018-02-05: 30 ug/min via INTRAVENOUS
  Filled 2018-02-04: qty 250

## 2018-02-04 MED ORDER — PREDNISONE 50 MG PO TABS
50.0000 mg | ORAL_TABLET | Freq: Four times a day (QID) | ORAL | Status: AC
  Administered 2018-02-04 – 2018-02-05 (×3): 50 mg via ORAL
  Filled 2018-02-04 (×3): qty 1

## 2018-02-04 MED ORDER — TOPIRAMATE 25 MG PO TABS
75.0000 mg | ORAL_TABLET | Freq: Every day | ORAL | Status: DC
  Administered 2018-02-05: 75 mg via ORAL
  Filled 2018-02-04 (×2): qty 3

## 2018-02-04 MED ORDER — ONDANSETRON HCL 4 MG/2ML IJ SOLN: 4.0000 mg | Freq: Four times a day (QID) | INTRAMUSCULAR | Status: DC | PRN

## 2018-02-04 MED ORDER — NITROGLYCERIN 0.4 MG SL SUBL: 0.4000 mg | SUBLINGUAL_TABLET | SUBLINGUAL | Status: DC | PRN

## 2018-02-04 MED ORDER — KETOROLAC TROMETHAMINE 30 MG/ML IJ SOLN
30.0000 mg | Freq: Four times a day (QID) | INTRAMUSCULAR | Status: DC | PRN
  Administered 2018-02-05 – 2018-02-06 (×3): 30 mg via INTRAVENOUS
  Filled 2018-02-04 (×3): qty 1

## 2018-02-04 MED ORDER — DIPHENHYDRAMINE HCL 25 MG PO CAPS
50.0000 mg | ORAL_CAPSULE | Freq: Once | ORAL | Status: AC
Start: 2018-02-05 — End: 2018-02-05
  Administered 2018-02-05: 50 mg via ORAL
  Filled 2018-02-04: qty 2

## 2018-02-04 MED ORDER — ALPRAZOLAM 0.5 MG PO TABS
1.0000 mg | ORAL_TABLET | Freq: Every evening | ORAL | Status: DC | PRN
  Administered 2018-02-04 – 2018-02-05 (×2): 1 mg via ORAL
  Filled 2018-02-04 (×2): qty 2

## 2018-02-04 MED ORDER — HEPARIN (PORCINE) IN NACL 100-0.45 UNIT/ML-% IJ SOLN
1400.0000 [IU]/h | INTRAMUSCULAR | Status: DC
  Administered 2018-02-05: 1400 [IU]/h via INTRAVENOUS
  Filled 2018-02-04: qty 250

## 2018-02-04 MED ORDER — SODIUM CHLORIDE 0.9 % WEIGHT BASED INFUSION
1.0000 mL/kg/h | INTRAVENOUS | Status: DC
  Administered 2018-02-04: 1 mL/kg/h via INTRAVENOUS

## 2018-02-04 MED ORDER — METOPROLOL SUCCINATE ER 100 MG PO TB24
100.0000 mg | ORAL_TABLET | Freq: Every day | ORAL | Status: DC
  Administered 2018-02-05 – 2018-02-06 (×2): 100 mg via ORAL
  Filled 2018-02-04 (×2): qty 1

## 2018-02-04 MED ORDER — ROSUVASTATIN CALCIUM 10 MG PO TABS: 10.0000 mg | ORAL_TABLET | Freq: Every day | ORAL | Status: DC

## 2018-02-04 NOTE — H&P (Addendum)
Cardiology Admission History and Physical:   Patient ID: Raymond Melton; MRN: [id number]; DOB: 1956-02-02   Admission date: 02/04/2018  Primary Care Provider: Patient, No Pcp Per Primary Cardiologist: Jannifer HickBerry-new  Chief Complaint:  Chest pain  Patient Profile:   Raymond Melton is a 62 y.o. male with a history of chronic pain and substance abuse who was transferred [facility] for diagnostic cath  History of Present Illness:   Raymond Melton a 62 y/o male who has a history of chronic pain medication and benzodiazepine abuse. He had neck surgery a few years ago and has indicated that was the reason for his medication abuse but [facility] records indicate he has a long history of medication abuse. He was recently evaluated by Dr Anne HahnWillis for intermittent mental status changes. MRI and EEG were normal and further investigation indicated he was abusing Xanax.    [facility] with complaints of Lt sided chest pain. EKG and serial Troponin were negative. His Myoview was initially read as low risk but over read indicated some LV dilatation. Because of ongoing chest pain the pt was placed on Heparin and NTG and transferred for cath. He continues to have chest pain -"agony". He says he hasn't used opiates in a year. He tells me he takes one 1mg  Xanax QHS. He does have risk factors for CAD- HTN, HLD, Fm Hx, and H/O smoking.    Past Medical History:  Diagnosis Date  . Alcohol abuse   . Altered mental status 12/17/2017  . Chronic pain   . Diabetes mellitus without complication (HCC)   . Hepatitis C   . Hypertension   . Immune deficiency disorder (HCC)   . Kidney stones   . TB lung, latent     Past Surgical History:  Procedure Laterality Date  . ABDOMINAL SURGERY    . ANKLE SURGERY    . CHOLECYSTECTOMY    . ELBOW SURGERY    . EYE SURGERY    . HERNIA REPAIR    . WRIST SURGERY       Medications Prior to Admission: Prior to Admission medications   Medication Sig  Start Date End Date Taking? Authorizing Provider  metoprolol succinate (TOPROL-XL) 100 MG 24 hr tablet Take 1 tablet (100 mg total) by mouth daily. Take with or immediately following a meal: For high blood pressure 12/26/17   Nwoko, Nicole KindredAgnes I, NP  rosuvastatin (CRESTOR) 10 MG tablet Take 1 tablet (10 mg total) by mouth daily. For high cholesterol 12/27/17   Armandina StammerNwoko, Agnes I, NP  topiramate (TOPAMAX) 25 MG tablet Take one tablet at night for one week, then take 2 tablets at night for one week, then take 3 tablets at night. 01/07/18   York SpanielWillis, Charles K, MD     Allergies:    Allergies  Allergen Reactions  . Contrast Media [Iodinated Diagnostic Agents] Anaphylaxis and Swelling  . Ioxaglate Anaphylaxis and Swelling  . Metrizamide Anaphylaxis and Swelling  . Other Anaphylaxis, Shortness Of Breath and Swelling    Iv dye  . Codeine Itching    Other reaction(s): Other (See Comments) restless  . Iohexol Other (See Comments)  . Latex   . Lisinopril   . Tuberculin     Other reaction(s): Other (See Comments) Severe skin reaction  . Tuberculin Tests Other (See Comments)    Severe skin reaction  . Valsartan   . Zolpidem Other (See Comments)    confusion    Social History:   Social History  Socioeconomic History  . Marital status: Married    Spouse name: Not on file  . Number of children: Not on file  . Years of education: Not on file  . Highest education level: Not on file  Social Needs  . Financial resource strain: Not on file  . Food insecurity - worry: Not on file  . Food insecurity - inability: Not on file  . Transportation needs - medical: Not on file  . Transportation needs - non-medical: Not on file  Occupational History  . Not on file  Tobacco Use  . Smoking status: Current Some Day Smoker    Packs/day: 0.50    Types: Cigarettes  . Smokeless tobacco: Never Used  Substance and Sexual Activity  . Alcohol use: Yes    Comment: daily  . Drug use: No  . Sexual activity: Not  Currently  Other Topics Concern  . Not on file  Social History Narrative  . Not on file    Family History:   The patient's family history includes Cancer in his brother and sister; Depression in his mother; Hypertension in his brother, father, mother, and sister; Leukemia in his mother; Stroke in his father.    ROS:  Please see the history of present illness.  All other ROS reviewed and negative.     Physical Exam/Data:   Vitals:   02/04/18 1552  BP: 131/79  Pulse: 97  Temp: 99.1 F (37.3 C)  TempSrc: Oral  SpO2: 94%    Intake/Output Summary (Last 24 hours) at 02/04/2018 1709 Last data filed at 02/04/2018 1600 Gross per 24 hour  Intake -  Output 125 ml  Net -125 ml   There were no vitals filed for this visit. There is no height or weight on file to calculate BMI.  General:  Well nourished, well developed, anxious, in no acute distress HEENT: normal Lymph: no adenopathy Neck: no JVD Endocrine:  No thryomegaly Vascular: No carotid bruits; FA pulses 2+ bilaterally without bruits  Cardiac:  normal S1, S2; RRR; no murmur  Lungs:  clear to auscultation bilaterally, no wheezing, rhonchi or rales  Abd: soft, nontender, no hepatomegaly  Ext: no edema, intact Rt radial pulse Musculoskeletal:  No deformities, BUE and BLE strength normal and equal Skin: warm and dry  Neuro:  CNs 2-12 intact, no focal abnormalities noted Psych:  Normal affect    EKG:  The ECG that was done 02/03/18- NSR, ST   Relevant CV Studies: See [facility] records  Laboratory Data:  Labs done at [facility]- Normal CBC, normal BMET, CXR-NAD   Assessment and Plan:   Chest pain with a moderate risk for ACS  HTN  HLD  Smoker  FmHx of CAD  H/O chronic pain and medication abuse  Known C-spine DJD- pt tells me he is to have another operation (Dr Wynetta Emery to do).  Plan: Cath tomorrow. The patient understands that risks included but are not limited to stroke (1 in 1000), death (1 in 1000), kidney  failure [usually temporary] (1 in 500), bleeding (1 in 200), allergic reaction [possibly serious] (1 in 200).  The patient understands and agrees to proceed. If he gets a stent his C-psine surgery will need to be put off.  He'll need pre cath Rx for contrast allergy.  Severity of Illness: The appropriate patient status for this patient is OBSERVATION. Observation status is judged to be reasonable and necessary in order to provide the required intensity of service to ensure the patient's safety. The patient's  presenting symptoms, physical exam findings, and initial radiographic and laboratory data in the context of their medical condition is felt to place them at decreased risk for further clinical deterioration. Furthermore, it is anticipated that the patient will be medically stable for discharge from the hospital within 2 midnights of admission. The following factors support the patient status of observation.   " The patient's presenting symptoms include chest pain. " The physical exam findings-normal " The initial radiographic and laboratory data include abnormal Myoview.     For questions or updates, please contact [facility] Please consult [email or web address] for contact info under Cardiology/STEMI.    Signed, Corine Shelter, PA-C  02/04/2018 5:09 PM   Agree with note written by Corine Shelter [facility]  Raymond Melton was transferred from [facility] with chest pain and an abnormal Myoview stress test.He has a history of chronic pain syndrome with addiction to narcotics secondary to prior cervical disc surgery and sequela thereof.his cardiac r who follows notable for essential hypertension, hyperlipidemia, tobacco abuse and f history. He's had chest pain for last several days and was admitted to [facility]. His enzymes were negative and his EKG showed no acute changes. A Myoview stress test showed some subtle inferior changes with transient ischemic dilatation (1.7 TID ). His pain improved with  supplemental glycerin. He was transferred by EMS on IV heparin and nitroglycerin for cardiac catheterization. He is currently pain-free. His exam is benign. Plan is for cardiac catheterization tomorrow.  Nanetta Batty 02/04/2018 5:32 PM

## 2018-02-04 NOTE — Interval H&P Note (Signed)
Cath Lab Visit (complete for each Cath Lab visit)  Clinical Evaluation Leading to the Procedure:   ACS: No.  Non-ACS:    Anginal Classification: CCS IV  Anti-ischemic medical therapy: Minimal Therapy (1 class of medications)  Non-Invasive Test Results: Intermediate-risk stress test findings: cardiac mortality 1-3%/year  Prior CABG: No previous CABG      History and Physical Interval Note:  02/04/2018 6:05 PM  Lauralyn PrimesMichael James Genther  has presented today for surgery, with the diagnosis of abn stress  The various methods of treatment have been discussed with the patient and family. After consideration of risks, benefits and other options for treatment, the patient has consented to  Procedure(s): LEFT HEART CATH AND CORONARY ANGIOGRAPHY (N/A) as a surgical intervention .  The patient's history has been reviewed, patient examined, no change in status, stable for surgery.  I have reviewed the patient's chart and labs.  Questions were answered to the patient's satisfaction.     Lyn RecordsHenry W Zahlia Deshazer III

## 2018-02-04 NOTE — Progress Notes (Signed)
ANTICOAGULATION CONSULT NOTE  Pharmacy Consult:  Heparin  Indication: chest pain/ACS  Allergies  Allergen Reactions  . Contrast Media [Iodinated Diagnostic Agents] Anaphylaxis and Swelling  . Ioxaglate Anaphylaxis and Swelling  . Metrizamide Anaphylaxis and Swelling  . Other Anaphylaxis, Shortness Of Breath and Swelling    Iv dye  . Codeine Itching    Other reaction(s): Other (See Comments) restless  . Iohexol Other (See Comments)  . Latex   . Lisinopril   . Tuberculin     Other reaction(s): Other (See Comments) Severe skin reaction  . Tuberculin Tests Other (See Comments)    Severe skin reaction  . Valsartan   . Zolpidem Other (See Comments)    confusion    Patient Measurements: Wt 190lb (86.4kg)   Vital Signs: Temp: 99.8 F (37.7 C) (03/13 2023) Temp Source: Oral (03/13 2023) BP: 124/77 (03/13 2023) Pulse Rate: 102 (03/13 2023)  Labs: Recent Labs    02/04/18 2136  HEPARINUNFRC 0.28*    CrCl cannot be calculated (Patient's most recent lab result is older than the maximum 21 days allowed.).    Assessment: [age] YOM [place] [facility] with CP and abnormal stress test.  Pharmacy consulted to dose heparin (started at [facility] at 1200 units/hr; start time is not clear). Plans noted for cath 3/14.  Heparin level is slightly sub-therapeutic; no bleeding reported.   Goal of Therapy:  Heparin level 0.3-0.7 units/ml Monitor platelets by anticoagulation protocol: Yes    Plan:   Increase heparin gtt to 1400 units/hr F/U AM labs   Shylo Zamor D. Laney Potashang, PharmD, BCPS Pager:  [phone] 02/04/2018, 10:14 PM

## 2018-02-04 NOTE — Progress Notes (Signed)
ANTICOAGULATION CONSULT NOTE - Initial Consult  Pharmacy Consult for heparin  Indication: chest pain/ACS  Allergies  Allergen Reactions  . Contrast Media [Iodinated Diagnostic Agents] Anaphylaxis and Swelling  . Ioxaglate Anaphylaxis and Swelling  . Metrizamide Anaphylaxis and Swelling  . Other Anaphylaxis, Shortness Of Breath and Swelling    Iv dye  . Codeine Itching    Other reaction(s): Other (See Comments) restless  . Iohexol Other (See Comments)  . Latex   . Lisinopril   . Tuberculin     Other reaction(s): Other (See Comments) Severe skin reaction  . Tuberculin Tests Other (See Comments)    Severe skin reaction  . Valsartan   . Zolpidem Other (See Comments)    confusion    Patient Measurements: Wt 190lb (86.4kg)   Vital Signs: Temp: 99.1 F (37.3 C) (03/13 1552) Temp Source: Oral (03/13 1552) BP: 131/79 (03/13 1552) Pulse Rate: 97 (03/13 1552)  Labs: No results for input(s): HGB, HCT, PLT, APTT, LABPROT, INR, HEPARINUNFRC, HEPRLOWMOCWT, CREATININE, CKTOTAL, CKMB, TROPONINI in the last 72 hours.  CrCl cannot be calculated (Patient's most recent lab result is older than the maximum 21 days allowed.).   Medical History: Past Medical History:  Diagnosis Date  . Alcohol abuse   . Altered mental status 12/17/2017  . Chronic pain   . Diabetes mellitus without complication (HCC)   . Hepatitis C   . Hypertension   . Immune deficiency disorder (HCC)   . Kidney stones   . TB lung, latent     Medications:  Medications Prior to Admission  Medication Sig Dispense Refill Last Dose  . metoprolol succinate (TOPROL-XL) 100 MG 24 hr tablet Take 1 tablet (100 mg total) by mouth daily. Take with or immediately following a meal: For high blood pressure     . rosuvastatin (CRESTOR) 10 MG tablet Take 1 tablet (10 mg total) by mouth daily. For high cholesterol     . topiramate (TOPAMAX) 25 MG tablet Take one tablet at night for one week, then take 2 tablets at night for one  week, then take 3 tablets at night. 90 tablet 3    Scheduled:  . [START ON 02/05/2018] aspirin EC  81 mg Oral Daily  . [START ON 02/05/2018] metoprolol succinate  100 mg Oral Daily  . [START ON 02/05/2018] rosuvastatin  10 mg Oral Daily  . [START ON 02/05/2018] topiramate  75 mg Oral Daily    Assessment: 62 yo male here from [facility] with CP and abnormal stress test. Pharmacy consulted to dose heparin (started at [facility] at 1200 units/hr; start time is not clear). Plans noted for cath 3/14.  Goal of Therapy:  Heparin level 0.3-0.7 units/ml Monitor platelets by anticoagulation protocol: Yes   Plan:   -Continue heparin at 1200 units/hr -Heparin level in 6 hours and daily wth CBC daily  Harland GermanAndrew Bambie Pizzolato, PharmD 02/04/2018 6:05 PM

## 2018-02-05 ENCOUNTER — Encounter (HOSPITAL_COMMUNITY): Payer: Self-pay | Admitting: *Deleted

## 2018-02-05 ENCOUNTER — Other Ambulatory Visit: Payer: Self-pay

## 2018-02-05 ENCOUNTER — Encounter (HOSPITAL_COMMUNITY)
Admission: AD | Disposition: A | Discharge status: Home / Self Care | Payer: Self-pay | Source: Other Acute Inpatient Hospital | Attending: Cardiovascular Disease

## 2018-02-05 DIAGNOSIS — I25750 Atherosclerosis of native coronary artery of transplanted heart with unstable angina: Secondary | ICD-10-CM

## 2018-02-05 DIAGNOSIS — I25110 Atherosclerotic heart disease of native coronary artery with unstable angina pectoris: Secondary | ICD-10-CM | POA: Diagnosis not present

## 2018-02-05 HISTORY — PX: INTRAVASCULAR PRESSURE WIRE/FFR STUDY: CATH118243

## 2018-02-05 HISTORY — PX: LEFT HEART CATH AND CORONARY ANGIOGRAPHY: CATH118249

## 2018-02-05 HISTORY — PX: CORONARY STENT INTERVENTION: CATH118234

## 2018-02-05 LAB — CBC
HCT: 38.7 % — ABNORMAL LOW (ref 39.0–52.0)
HEMATOCRIT: 38.7 % — AB (ref 39.0–52.0)
HEMOGLOBIN: 13.5 g/dL (ref 13.0–17.0)
Hemoglobin: 14 g/dL (ref 13.0–17.0)
MCH: 32.6 pg (ref 26.0–34.0)
MCH: 33.7 pg (ref 26.0–34.0)
MCHC: 34.9 g/dL (ref 30.0–36.0)
MCHC: 36.2 g/dL — AB (ref 30.0–36.0)
MCV: 93.5 fL (ref 78.0–100.0)
MCV: 93 fL (ref 78.0–100.0)
PLATELETS: 158 10*3/uL (ref 150–400)
Platelets: 167 10*3/uL (ref 150–400)
RBC: 4.14 MIL/uL — ABNORMAL LOW (ref 4.22–5.81)
RBC: 4.16 MIL/uL — AB (ref 4.22–5.81)
RDW: 12.1 % (ref 11.5–15.5)
RDW: 12.1 % (ref 11.5–15.5)
WBC: 10 10*3/uL (ref 4.0–10.5)
WBC: 8.4 10*3/uL (ref 4.0–10.5)

## 2018-02-05 LAB — POCT ACTIVATED CLOTTING TIME
ACTIVATED CLOTTING TIME: 279 s
Activated Clotting Time: 290 seconds
Activated Clotting Time: 400 seconds

## 2018-02-05 LAB — BASIC METABOLIC PANEL
ANION GAP: 8 (ref 5–15)
BUN: 13 mg/dL (ref 6–20)
CO2: 23 mmol/L (ref 22–32)
Calcium: 9.1 mg/dL (ref 8.9–10.3)
Chloride: 101 mmol/L (ref 101–111)
Creatinine, Ser: 0.93 mg/dL (ref 0.61–1.24)
GFR calc Af Amer: >60 mL/min (ref >60)
GLUCOSE: 216 mg/dL — AB (ref 65–99)
POTASSIUM: 4.3 mmol/L (ref 3.5–5.1)
Sodium: 132 mmol/L — ABNORMAL LOW (ref 135–145)

## 2018-02-05 LAB — CREATININE, SERUM
CREATININE: 0.84 mg/dL (ref 0.61–1.24)
GFR calc Af Amer: >60 mL/min (ref >60)
GFR calc non Af Amer: >60 mL/min (ref >60)

## 2018-02-05 LAB — MRSA PCR SCREENING: MRSA BY PCR: NEGATIVE

## 2018-02-05 LAB — TSH: TSH: 1.065 u[IU]/mL (ref 0.350–4.500)

## 2018-02-05 LAB — HEPARIN LEVEL (UNFRACTIONATED): Heparin Unfractionated: 0.42 IU/mL (ref 0.30–0.70)

## 2018-02-05 LAB — PROTIME-INR
INR: 1.02
Prothrombin Time: 13.3 seconds (ref 11.4–15.2)

## 2018-02-05 SURGERY — LEFT HEART CATH AND CORONARY ANGIOGRAPHY
Anesthesia: LOCAL

## 2018-02-05 MED ORDER — TICAGRELOR 90 MG PO TABS
90.0000 mg | ORAL_TABLET | Freq: Two times a day (BID) | ORAL | Status: DC
  Administered 2018-02-05 – 2018-02-06 (×2): 90 mg via ORAL
  Filled 2018-02-05 (×2): qty 1

## 2018-02-05 MED ORDER — VERAPAMIL HCL 2.5 MG/ML IV SOLN
INTRAVENOUS | Status: AC
  Filled 2018-02-05: qty 2

## 2018-02-05 MED ORDER — FENTANYL CITRATE (PF) 100 MCG/2ML IJ SOLN
INTRAMUSCULAR | Status: DC | PRN
  Administered 2018-02-05: 50 ug via INTRAVENOUS

## 2018-02-05 MED ORDER — ADENOSINE 12 MG/4ML IV SOLN
INTRAVENOUS | Status: AC
  Filled 2018-02-05: qty 16

## 2018-02-05 MED ORDER — LIDOCAINE HCL (PF) 1 % IJ SOLN
INTRAMUSCULAR | Status: DC | PRN
  Administered 2018-02-05: 2 mL

## 2018-02-05 MED ORDER — ASPIRIN 81 MG PO CHEW
81.0000 mg | CHEWABLE_TABLET | Freq: Once | ORAL | Status: AC
  Administered 2018-02-05: 81 mg via ORAL
  Filled 2018-02-05: qty 1

## 2018-02-05 MED ORDER — NITROGLYCERIN 1 MG/10 ML FOR IR/CATH LAB
INTRA_ARTERIAL | Status: DC | PRN
  Administered 2018-02-05: 200 ug via INTRACORONARY

## 2018-02-05 MED ORDER — IOPAMIDOL (ISOVUE-370) INJECTION 76%
INTRAVENOUS | Status: DC | PRN
  Administered 2018-02-05: 150 mL

## 2018-02-05 MED ORDER — TICAGRELOR 90 MG PO TABS
ORAL_TABLET | ORAL | Status: AC
  Filled 2018-02-05: qty 2

## 2018-02-05 MED ORDER — ASPIRIN 81 MG PO CHEW
CHEWABLE_TABLET | ORAL | Status: AC
  Filled 2018-02-05: qty 1

## 2018-02-05 MED ORDER — NITROGLYCERIN 1 MG/10 ML FOR IR/CATH LAB
INTRA_ARTERIAL | Status: AC
  Filled 2018-02-05: qty 10

## 2018-02-05 MED ORDER — ROSUVASTATIN CALCIUM 20 MG PO TABS
20.0000 mg | ORAL_TABLET | Freq: Every day | ORAL | Status: DC
  Administered 2018-02-06: 20 mg via ORAL
  Filled 2018-02-05: qty 1

## 2018-02-05 MED ORDER — HEPARIN SODIUM (PORCINE) 5000 UNIT/ML IJ SOLN
5000.0000 [IU] | Freq: Three times a day (TID) | INTRAMUSCULAR | Status: DC
  Administered 2018-02-05: 5000 [IU] via SUBCUTANEOUS
  Filled 2018-02-05 (×2): qty 1

## 2018-02-05 MED ORDER — MIDAZOLAM HCL 2 MG/2ML IJ SOLN
INTRAMUSCULAR | Status: DC | PRN
  Administered 2018-02-05 (×2): 1 mg via INTRAVENOUS

## 2018-02-05 MED ORDER — HEPARIN (PORCINE) IN NACL 2-0.9 UNIT/ML-% IJ SOLN
INTRAMUSCULAR | Status: AC | PRN
  Administered 2018-02-05 (×2): 500 mL

## 2018-02-05 MED ORDER — TICAGRELOR 90 MG PO TABS
ORAL_TABLET | ORAL | Status: DC | PRN
  Administered 2018-02-05: 180 mg via ORAL

## 2018-02-05 MED ORDER — SODIUM CHLORIDE 0.9 % IV SOLN: 250.0000 mL | INTRAVENOUS | Status: DC | PRN

## 2018-02-05 MED ORDER — LIDOCAINE HCL 1 % IJ SOLN
INTRAMUSCULAR | Status: AC
  Filled 2018-02-05: qty 20

## 2018-02-05 MED ORDER — IOPAMIDOL (ISOVUE-370) INJECTION 76%
INTRAVENOUS | Status: AC
  Filled 2018-02-05: qty 50

## 2018-02-05 MED ORDER — ACETAMINOPHEN 325 MG PO TABS: 650.0000 mg | ORAL_TABLET | ORAL | Status: DC | PRN

## 2018-02-05 MED ORDER — HEPARIN (PORCINE) IN NACL 2-0.9 UNIT/ML-% IJ SOLN
INTRAMUSCULAR | Status: AC
  Filled 2018-02-05: qty 1000

## 2018-02-05 MED ORDER — SODIUM CHLORIDE 0.9 % WEIGHT BASED INFUSION: 1.5000 mL/kg/h | INTRAVENOUS | Status: AC

## 2018-02-05 MED ORDER — FENTANYL CITRATE (PF) 100 MCG/2ML IJ SOLN
INTRAMUSCULAR | Status: AC
Start: 2018-02-05 — End: 2018-02-05
  Filled 2018-02-05: qty 2

## 2018-02-05 MED ORDER — HEPARIN SODIUM (PORCINE) 1000 UNIT/ML IJ SOLN
INTRAMUSCULAR | Status: DC | PRN
  Administered 2018-02-05: 3000 [IU] via INTRAVENOUS
  Administered 2018-02-05: 5000 [IU] via INTRAVENOUS
  Administered 2018-02-05: 3000 [IU] via INTRAVENOUS

## 2018-02-05 MED ORDER — NICOTINE 21 MG/24HR TD PT24
21.0000 mg | MEDICATED_PATCH | Freq: Every day | TRANSDERMAL | Status: DC
  Administered 2018-02-05 – 2018-02-06 (×2): 21 mg via TRANSDERMAL
  Filled 2018-02-05 (×2): qty 1

## 2018-02-05 MED ORDER — ASPIRIN 81 MG PO CHEW: 81.0000 mg | CHEWABLE_TABLET | Freq: Every day | ORAL | Status: DC

## 2018-02-05 MED ORDER — MIDAZOLAM HCL 2 MG/2ML IJ SOLN
INTRAMUSCULAR | Status: AC
  Filled 2018-02-05: qty 2

## 2018-02-05 MED ORDER — ONDANSETRON HCL 4 MG/2ML IJ SOLN: 4.0000 mg | Freq: Four times a day (QID) | INTRAMUSCULAR | Status: DC | PRN

## 2018-02-05 MED ORDER — SODIUM CHLORIDE 0.9% FLUSH
3.0000 mL | Freq: Two times a day (BID) | INTRAVENOUS | Status: DC
  Administered 2018-02-05: 3 mL via INTRAVENOUS

## 2018-02-05 MED ORDER — LABETALOL HCL 5 MG/ML IV SOLN: 10.0000 mg | INTRAVENOUS | Status: AC | PRN

## 2018-02-05 MED ORDER — IOPAMIDOL (ISOVUE-370) INJECTION 76%
INTRAVENOUS | Status: AC
  Filled 2018-02-05: qty 100

## 2018-02-05 MED ORDER — SODIUM CHLORIDE 0.9% FLUSH: 3.0000 mL | INTRAVENOUS | Status: DC | PRN

## 2018-02-05 MED ORDER — HEPARIN SODIUM (PORCINE) 1000 UNIT/ML IJ SOLN
INTRAMUSCULAR | Status: AC
  Filled 2018-02-05: qty 1

## 2018-02-05 MED ORDER — HYDRALAZINE HCL 20 MG/ML IJ SOLN: 5.0000 mg | INTRAMUSCULAR | Status: AC | PRN

## 2018-02-05 MED ORDER — VERAPAMIL HCL 2.5 MG/ML IV SOLN
INTRAVENOUS | Status: DC | PRN
  Administered 2018-02-05: 10 mL via INTRA_ARTERIAL

## 2018-02-05 MED ORDER — ASPIRIN 325 MG PO TABS
ORAL_TABLET | ORAL | Status: AC
  Filled 2018-02-05: qty 1

## 2018-02-05 SURGICAL SUPPLY — 24 items
BALLN SAPPHIRE 2.0X12 (BALLOONS) ×2
BALLN SAPPHIRE ~~LOC~~ 2.5X8 (BALLOONS) ×2 IMPLANT
BALLN ~~LOC~~ EMERGE MR 2.5X12 (BALLOONS) ×2
BALLOON SAPPHIRE 2.0X12 (BALLOONS) ×1 IMPLANT
BALLOON ~~LOC~~ EMERGE MR 2.5X12 (BALLOONS) ×1 IMPLANT
BAND ZEPHYR COMPRESS 30 LONG (HEMOSTASIS) ×2 IMPLANT
CATH INFINITI 5 FR JL3.5 (CATHETERS) ×2 IMPLANT
CATH INFINITI JR4 5F (CATHETERS) ×2 IMPLANT
CATH MACH 1 8FR AL1 SH 90CM (CATHETERS) IMPLANT
CATH VISTA GUIDE 6FR XB3 (CATHETERS) ×2 IMPLANT
COVER PRB 48X5XTLSCP FOLD TPE (BAG) ×1 IMPLANT
COVER PROBE 5X48 (BAG) ×1
GLIDESHEATH SLEND A-KIT 6F 22G (SHEATH) ×2 IMPLANT
GUIDEWIRE INQWIRE 1.5J.035X260 (WIRE) ×1 IMPLANT
GUIDEWIRE PRESSURE COMET II (WIRE) ×2 IMPLANT
INQWIRE 1.5J .035X260CM (WIRE) ×2
KIT ENCORE 26 ADVANTAGE (KITS) ×2 IMPLANT
KIT HEART LEFT (KITS) ×2 IMPLANT
KIT HEMO VALVE WATCHDOG (MISCELLANEOUS) ×2 IMPLANT
PACK CARDIAC CATHETERIZATION (CUSTOM PROCEDURE TRAY) ×2 IMPLANT
STENT SYNERGY DES 2.25X16 (Permanent Stent) ×2 IMPLANT
TRANSDUCER W/STOPCOCK (MISCELLANEOUS) ×2 IMPLANT
TUBING CIL FLEX 10 FLL-RA (TUBING) ×2 IMPLANT
WIRE ASAHI PROWATER 180CM (WIRE) ×2 IMPLANT

## 2018-02-05 NOTE — Progress Notes (Signed)
Inpatient Diabetes Program Recommendations  AACE/ADA: New Consensus Statement on Inpatient Glycemic Control (2015)  Target Ranges:  Prepandial:   less than 140 mg/dL      Peak postprandial:   less than 180 mg/dL (1-2 hours)      Critically ill patients:  140 - 180 mg/dL   Lab Results  Component Value Date   GLUCAP 106 (H) 12/02/2016   HGBA1C 6.0 (H) 11/27/2016    Review of Glycemic ControlResults for Orpah MelterSEIP, Byrne JAMES (MRN [id number]) as of 02/05/2018 09:36  Ref. Range 02/05/2018 03:25  Glucose Latest Ref Range: 65 - 99 mg/dL 782216 (H)    Diabetes history: Type 2 DM Outpatient Diabetes medications: Metformin 500 mg bid, Janumet 50-1000 mg bid Current orders for Inpatient glycemic control:  None Inpatient Diabetes Program Recommendations:   Please add Novolog moderate correction tid with meals and HS.  Thanks,  Beryl MeagerJenny Tacie Mccuistion, RN, BC-ADM Inpatient Diabetes Coordinator Pager [phone] (8a-5p)

## 2018-02-05 NOTE — Care Management Obs Status (Signed)
MEDICARE OBSERVATION STATUS NOTIFICATION   Patient Details  Name: Raymond Melton MRN: [id number] Date of Birth: 12/07/1955   Medicare Observation Status Notification Given:  No(MOON not given - pt has been approved for inpt  so MOON not given)    Cherylann ParrClaxton, Jaxtin Raimondo S, RN 02/05/2018, 5:04 PM

## 2018-02-05 NOTE — Care Management Obs Status (Addendum)
MEDICARE OBSERVATION STATUS NOTIFICATION   Patient Details  Name: Raymond Melton MRN: [id number] Date of Birth: 10-15-1956   Medicare Observation Status Notification Given:  Yes    Cherylann ParrClaxton, Jerimie Mancuso S, RN 02/05/2018, 4:52 PM    MOON not given - pt has been reviewed and approved for inpt

## 2018-02-05 NOTE — Progress Notes (Addendum)
Progress Note  Patient Name: Raymond Melton Date of Encounter: 02/05/2018  Primary Cardiologist: No primary care provider on file.   Subjective   Patient was resting in his bed upon entering the room.  He stated that his chest pain had persisted requiring multiple doses of morphine overnight.  He did request that the nursing staff decrease the dosing of the nitroglycerin drip as this caused head pain.  At full dose, the nitroglycerin was sufficient at decreasing his chest pain however, he cannot tolerate this due to the headache.  He is aware of the plan is to undergo cardiac catheterization today and is in agreement.  Inpatient Medications    Scheduled Meds: . aspirin EC  81 mg Oral Daily  . diphenhydrAMINE  50 mg Oral Once   Or  . diphenhydrAMINE  50 mg Intravenous Once  . metoprolol succinate  100 mg Oral Daily  . predniSONE  50 mg Oral Q6H  . rosuvastatin  10 mg Oral Daily  . topiramate  75 mg Oral Daily   Continuous Infusions: . sodium chloride 1 mL/kg/hr (02/04/18 2152)  . heparin 1,400 Units/hr (02/05/18 1610)  . nitroGLYCERIN 30 mcg/min (02/05/18 0652)   PRN Meds: acetaminophen, ALPRAZolam, ketorolac, morphine injection, nitroGLYCERIN, ondansetron (ZOFRAN) IV, tiZANidine   Vital Signs    Vitals:   02/04/18 1552 02/04/18 2023 02/04/18 2354 02/05/18 0348  BP: 131/79 124/77 119/62 123/78  Pulse: 97 (!) 102 96 80  Resp:  16 19 15   Temp: 99.1 F (37.3 C) 99.8 F (37.7 C) 97.8 F (36.6 C) 97.7 F (36.5 C)  TempSrc: Oral Oral Oral Oral  SpO2: 94% 94% 93% 94%  Weight:  81 kg (178 lb 8 oz)    Height:  6\' 1"  (1.854 m)      Intake/Output Summary (Last 24 hours) at 02/05/2018 0730 Last data filed at 02/05/2018 0500 Gross per 24 hour  Intake 832.3 ml  Output 775 ml  Net 57.3 ml   Filed Weights   02/04/18 2023  Weight: 81 kg (178 lb 8 oz)    Telemetry    Sinus tachycardia - Personally Reviewed  ECG    Normal sinus rhythm.  Minimal ST elevation in  leads I, II, V5 and V6 with poor R wave progression in precordial leads.- Personally Reviewed  Physical Exam   GEN: No acute distress.  Non diaphoretic. Calm Neck: No JVD Cardiac: RRR, no murmurs, rubs, or gallops.  Respiratory: Clear to auscultation bilaterally. GI: Soft, nontender, non-distended  MS: No edema; No deformity. Neuro:  Nonfocal  Psych: Normal affect   Labs    Chemistry Recent Labs  Lab 02/05/18 0325  NA 132*  K 4.3  CL 101  CO2 23  GLUCOSE 216*  BUN 13  CREATININE 0.93  CALCIUM 9.1  GFRNONAA >60  GFRAA >60  ANIONGAP 8     Hematology Recent Labs  Lab 02/05/18 0325  WBC 10.0  RBC 4.14*  HGB 13.5  HCT 38.7*  MCV 93.5  MCH 32.6  MCHC 34.9  RDW 12.1  PLT 167    Cardiac EnzymesNo results for input(s): TROPONINI in the last 168 hours. No results for input(s): TROPIPOC in the last 168 hours.   BNPNo results for input(s): BNP, PROBNP in the last 168 hours.   DDimer No results for input(s): DDIMER in the last 168 hours.   Radiology    No results found.  Cardiac Studies   Cardiac cath ordered for 02/05/2018  Patient Profile  62 y.o. male who [facility] presented [facility] with chest pain.  He underwent an Myoview stress test abnormal demonstrating subtle inferior changes with transient ischemic dilation.  Is a past medical history notable for essential hypertension, HLD, tobacco abuse, and polysubstance abuse with prescription opioids and benzodiazepines.  Chest pain had lasted for several days prior to his admission [facility].  He was noted to have negative enzymes, EKG showed no acute changes.  His pain did however improve with sub-lingual nitroglycerin.  He was transferred by EMS on IV heparin and nitroglycerin for cardiac cath and with his current hospital where he was determined to be pain-free at that time.  Assessment & Plan    Chest pain: Patient is scheduled for cardiac cath today 02/05/2018 due to chest pain with  abnormal stress test as above. Continue morphine 4 mg IV every 3 hours as needed for severe pain Continue sublingual nitroglycerin as needed for chest pain Continue nitroglycerin GTT Continue rosuvastatin 10 mg Continue ASA, and heparin Continue metoprolol succinate 100 mg daily Continue alprazolam 1 mg nightly for anxiety Patient n.p.o. for cardiac cath today  For questions or updates, please contact [facility] Please consult [email or web address] for contact info under Cardiology/STEMI.    Signed, Lanelle BalLawrence Harbrecht, MD  02/05/2018, 7:30 AM    Agree with note by Dr. Crista ElliotHarbrecht  Patient ready for discharge this morning. He underwent cardiac catheterization yesterday by Dr. Katrinka BlazingSmith revealing a tight obtuse marginal branch stenosis which was stented successfully. Otherwise, he had noncritical CAD with normal LV function. He can be discharged on dual antiplatelet therapy and follow-up with a mid-level provider in 7-10 days and with me in 4-6 weeks.  Runell GessJonathan J. Jaleia Hanke, M.D., FACP, [facility]ACC, Earl LagosFAHA, [facility] [address] [place], [place]  [id number]  [phone] 02/06/2018 10:40 AM

## 2018-02-05 NOTE — Interval H&P Note (Signed)
Cath Lab Visit (complete for each Cath Lab visit)  Clinical Evaluation Leading to the Procedure:   ACS: Yes.    Non-ACS:    Anginal Classification: CCS III  Anti-ischemic medical therapy: Minimal Therapy (1 class of medications)  Non-Invasive Test Results: Intermediate-risk stress test findings: cardiac mortality 1-3%/year  Prior CABG: No previous CABG      History and Physical Interval Note:  02/05/2018 9:44 AM  Raymond Melton  has presented today for surgery, with the diagnosis of abn stress  The various methods of treatment have been discussed with the patient and family. After consideration of risks, benefits and other options for treatment, the patient has consented to  Procedure(s): LEFT HEART CATH AND CORONARY ANGIOGRAPHY (N/A) as a surgical intervention .  The patient's history has been reviewed, patient examined, no change in status, stable for surgery.  I have reviewed the patient's chart and labs.  Questions were answered to the patient's satisfaction.     Lyn RecordsHenry W Abhay Godbolt III

## 2018-02-05 NOTE — Progress Notes (Signed)
ANTICOAGULATION CONSULT NOTE  Pharmacy Consult:  Heparin  Indication: chest pain/ACS  Allergies  Allergen Reactions  . Contrast Media [Iodinated Diagnostic Agents] Anaphylaxis and Swelling  . Ioxaglate Anaphylaxis and Swelling  . Metrizamide Anaphylaxis and Swelling  . Other Anaphylaxis, Shortness Of Breath and Swelling    Iv dye  . Codeine Itching    Other reaction(s): Other (See Comments) restless  . Iohexol Other (See Comments)  . Latex   . Lisinopril   . Tuberculin     Other reaction(s): Other (See Comments) Severe skin reaction  . Tuberculin Tests Other (See Comments)    Severe skin reaction  . Valsartan   . Zolpidem Other (See Comments)    confusion    Patient Measurements: Wt 190lb (86.4kg)   Vital Signs: Temp: 97.7 F (36.5 C) (03/14 0706) Temp Source: Oral (03/14 0706) BP: 100/55 (03/14 0706) Pulse Rate: 80 (03/14 0348)  Labs: Recent Labs    02/04/18 2136 02/05/18 0325  HGB  --  13.5  HCT  --  38.7*  PLT  --  167  LABPROT  --  13.3  INR  --  1.02  HEPARINUNFRC 0.28* 0.42  CREATININE  --  0.93    Estimated Creatinine Clearance: 91.9 mL/min (by C-G formula based on SCr of 0.93 mg/dL).  Assessment: [age] YOM transferred [facility] with [facility] and abnormal stress test.  Pharmacy consulted to dose heparin (started at [facility] at 1200 units/hr; start time is not clear). Plans noted for cath 3/14.  Heparin level is within desired goal range this morning at 0.42 units/ml.  No noted bleeding complications and H/H and platelets are stable.  His PT/INR is within normal ranges as well.  Goal of Therapy:  Heparin level 0.3-0.7 units/ml Monitor platelets by anticoagulation protocol: Yes   Plan:   Continue heparin gtt at 1400 units/hr F/U after cardiac cath today for ongoing plans  Nadara MustardNita Arlene Genova, PharmD., MS Clinical Pharmacist Pager:  [phone] Thank you for allowing pharmacy to be part of this patients care team. 02/05/2018, 7:45 AM

## 2018-02-06 ENCOUNTER — Telehealth: Payer: Self-pay | Admitting: Cardiology

## 2018-02-06 DIAGNOSIS — Z79899 Other long term (current) drug therapy: Secondary | ICD-10-CM | POA: Diagnosis not present

## 2018-02-06 DIAGNOSIS — Z91041 Radiographic dye allergy status: Secondary | ICD-10-CM | POA: Diagnosis not present

## 2018-02-06 DIAGNOSIS — E785 Hyperlipidemia, unspecified: Secondary | ICD-10-CM | POA: Diagnosis present

## 2018-02-06 DIAGNOSIS — I2510 Atherosclerotic heart disease of native coronary artery without angina pectoris: Secondary | ICD-10-CM | POA: Diagnosis present

## 2018-02-06 DIAGNOSIS — G894 Chronic pain syndrome: Secondary | ICD-10-CM | POA: Diagnosis present

## 2018-02-06 DIAGNOSIS — F1010 Alcohol abuse, uncomplicated: Secondary | ICD-10-CM | POA: Diagnosis present

## 2018-02-06 DIAGNOSIS — Z888 Allergy status to other drugs, medicaments and biological substances status: Secondary | ICD-10-CM | POA: Diagnosis not present

## 2018-02-06 DIAGNOSIS — R079 Chest pain, unspecified: Secondary | ICD-10-CM | POA: Diagnosis not present

## 2018-02-06 DIAGNOSIS — Z87442 Personal history of urinary calculi: Secondary | ICD-10-CM | POA: Diagnosis not present

## 2018-02-06 DIAGNOSIS — E119 Type 2 diabetes mellitus without complications: Secondary | ICD-10-CM | POA: Diagnosis present

## 2018-02-06 DIAGNOSIS — F1910 Other psychoactive substance abuse, uncomplicated: Secondary | ICD-10-CM | POA: Diagnosis present

## 2018-02-06 DIAGNOSIS — I25750 Atherosclerosis of native coronary artery of transplanted heart with unstable angina: Secondary | ICD-10-CM | POA: Diagnosis present

## 2018-02-06 DIAGNOSIS — Z91040 Latex allergy status: Secondary | ICD-10-CM | POA: Diagnosis not present

## 2018-02-06 DIAGNOSIS — T508X5A Adverse effect of diagnostic agents, initial encounter: Secondary | ICD-10-CM | POA: Diagnosis present

## 2018-02-06 DIAGNOSIS — I119 Hypertensive heart disease without heart failure: Secondary | ICD-10-CM | POA: Diagnosis present

## 2018-02-06 DIAGNOSIS — Z8249 Family history of ischemic heart disease and other diseases of the circulatory system: Secondary | ICD-10-CM | POA: Diagnosis not present

## 2018-02-06 DIAGNOSIS — Z9049 Acquired absence of other specified parts of digestive tract: Secondary | ICD-10-CM | POA: Diagnosis not present

## 2018-02-06 DIAGNOSIS — G8929 Other chronic pain: Secondary | ICD-10-CM | POA: Diagnosis present

## 2018-02-06 DIAGNOSIS — B1920 Unspecified viral hepatitis C without hepatic coma: Secondary | ICD-10-CM | POA: Diagnosis present

## 2018-02-06 DIAGNOSIS — R7611 Nonspecific reaction to tuberculin skin test without active tuberculosis: Secondary | ICD-10-CM | POA: Diagnosis present

## 2018-02-06 DIAGNOSIS — F1310 Sedative, hypnotic or anxiolytic abuse, uncomplicated: Secondary | ICD-10-CM | POA: Diagnosis present

## 2018-02-06 DIAGNOSIS — F3160 Bipolar disorder, current episode mixed, unspecified: Secondary | ICD-10-CM | POA: Diagnosis present

## 2018-02-06 LAB — BASIC METABOLIC PANEL
Anion gap: 10 (ref 5–15)
BUN: 16 mg/dL (ref 6–20)
CALCIUM: 8.9 mg/dL (ref 8.9–10.3)
CHLORIDE: 106 mmol/L (ref 101–111)
CO2: 19 mmol/L — AB (ref 22–32)
CREATININE: 0.9 mg/dL (ref 0.61–1.24)
GFR calc Af Amer: >60 mL/min (ref >60)
GFR calc non Af Amer: >60 mL/min (ref >60)
GLUCOSE: 137 mg/dL — AB (ref 65–99)
Potassium: 3.7 mmol/L (ref 3.5–5.1)
Sodium: 135 mmol/L (ref 135–145)

## 2018-02-06 LAB — CBC
HEMATOCRIT: 38.3 % — AB (ref 39.0–52.0)
HEMOGLOBIN: 13.3 g/dL (ref 13.0–17.0)
MCH: 32.4 pg (ref 26.0–34.0)
MCHC: 34.7 g/dL (ref 30.0–36.0)
MCV: 93.2 fL (ref 78.0–100.0)
Platelets: 177 10*3/uL (ref 150–400)
RBC: 4.11 MIL/uL — ABNORMAL LOW (ref 4.22–5.81)
RDW: 12 % (ref 11.5–15.5)
WBC: 13.7 10*3/uL — ABNORMAL HIGH (ref 4.0–10.5)

## 2018-02-06 MED ORDER — ROSUVASTATIN CALCIUM 20 MG PO TABS: 20.0000 mg | ORAL_TABLET | Freq: Every day | ORAL | 3 refills | Status: AC

## 2018-02-06 MED ORDER — NITROGLYCERIN 0.4 MG SL SUBL: 0.4000 mg | SUBLINGUAL_TABLET | SUBLINGUAL | 12 refills | Status: DC | PRN

## 2018-02-06 MED ORDER — ASPIRIN 81 MG PO TBEC: 81.0000 mg | DELAYED_RELEASE_TABLET | Freq: Every day | ORAL | 3 refills | Status: DC

## 2018-02-06 MED ORDER — NICOTINE 21 MG/24HR TD PT24: 21.0000 mg | MEDICATED_PATCH | Freq: Every day | TRANSDERMAL | 0 refills | Status: DC

## 2018-02-06 MED ORDER — TICAGRELOR 90 MG PO TABS: 90.0000 mg | ORAL_TABLET | Freq: Two times a day (BID) | ORAL | 0 refills | Status: DC

## 2018-02-06 MED ORDER — TRAMADOL HCL 50 MG PO TABS: 50.0000 mg | ORAL_TABLET | Freq: Three times a day (TID) | ORAL | 0 refills | Status: DC | PRN

## 2018-02-06 MED FILL — Lidocaine HCl Local Inj 1%: INTRAMUSCULAR | Qty: 20 | Status: AC

## 2018-02-06 MED FILL — Heparin Sodium (Porcine) 2 Unit/ML in Sodium Chloride 0.9%: INTRAMUSCULAR | Qty: 1000 | Status: AC

## 2018-02-06 NOTE — Plan of Care (Signed)
Pt. Received all discharge paperwork. Pt. Verbalized that he would [facility] [place]. Pt. Progressed and adequate for discharge per MD order.

## 2018-02-06 NOTE — Progress Notes (Signed)
Progress Note  Patient Name: Lauralyn PrimesMichael James Klinger Date of Encounter: 02/06/2018  Primary Cardiologist: No primary care provider on file.   Subjective   The patient was up walking and eating a snack between the room today.  He inquires when he can be discharged home as he felt much improved.  He attested to some soreness in the chest and shoulder area otherwise denied chest pain.  Pain is resolved since admission.  Patient stated understanding of the postprocedural results and current treatment recommendations.  Inpatient Medications    Scheduled Meds: . aspirin EC  81 mg Oral Daily  . heparin  5,000 Units Subcutaneous Q8H  . metoprolol succinate  100 mg Oral Daily  . nicotine  21 mg Transdermal Daily  . rosuvastatin  20 mg Oral Daily  . sodium chloride flush  3 mL Intravenous Q12H  . ticagrelor  90 mg Oral BID  . topiramate  75 mg Oral Daily   Continuous Infusions: . sodium chloride     PRN Meds: sodium chloride, acetaminophen, ALPRAZolam, ketorolac, morphine injection, nitroGLYCERIN, ondansetron (ZOFRAN) IV, sodium chloride flush, tiZANidine   Vital Signs    Vitals:   02/05/18 1700 02/05/18 1900 02/05/18 2340 02/06/18 0321  BP: 106/76 (!) 171/93 128/63 130/67  Pulse:      Resp: 16 14 14 20   Temp:  97.8 F (36.6 C) 97.8 F (36.6 C) 97.8 F (36.6 C)  TempSrc:  Oral Oral Oral  SpO2: 98% 99% 98% 98%  Weight:      Height:        Intake/Output Summary (Last 24 hours) at 02/06/2018 0602 Last data filed at 02/06/2018 0324 Gross per 24 hour  Intake 704 ml  Output 1300 ml  Net -596 ml   Filed Weights   02/04/18 2023  Weight: 81 kg (178 lb 8 oz)    Telemetry    Normal sinus rhythm- Personally Reviewed  ECG    Post PCI EKG in NSR, diffuse T-wave abnormalities absent - Personally Reviewed  Physical Exam   GEN: No acute distress.   Neck: No JVD Cardiac: RRR, no murmurs, rubs, or gallops.  Respiratory: Clear to auscultation bilaterally. GI: Soft, nontender,  non-distended  MS: No edema; No deformity. Neuro:  Nonfocal  Psych: Normal affect   Labs    Chemistry Recent Labs  Lab 02/05/18 0325 02/05/18 1213 02/06/18 0257  NA 132*  --  135  K 4.3  --  3.7  CL 101  --  106  CO2 23  --  19*  GLUCOSE 216*  --  137*  BUN 13  --  16  CREATININE 0.93 0.84 0.90  CALCIUM 9.1  --  8.9  GFRNONAA >60 >60 >60  GFRAA >60 >60 >60  ANIONGAP 8  --  10     Hematology Recent Labs  Lab 02/05/18 0325 02/05/18 1213 02/06/18 0257  WBC 10.0 8.4 13.7*  RBC 4.14* 4.16* 4.11*  HGB 13.5 14.0 13.3  HCT 38.7* 38.7* 38.3*  MCV 93.5 93.0 93.2  MCH 32.6 33.7 32.4  MCHC 34.9 36.2* 34.7  RDW 12.1 12.1 12.0  PLT 167 158 177    Cardiac EnzymesNo results for input(s): TROPONINI in the last 168 hours. No results for input(s): TROPIPOC in the last 168 hours.   BNPNo results for input(s): BNP, PROBNP in the last 168 hours.   DDimer No results for input(s): DDIMER in the last 168 hours.   Radiology     Cardiac Studies   Cardiac  Cath:  Normal left main.  Scattered irregularities less than 40% in the proximal mid and distal RCA.  PDA also has less than 50% narrowing.  LAD with luminal irregularities but no high-grade obstruction.  Areas of irregularity less than 40%.  Circumflex coronary artery gives origin to a small to moderate sized first obtuse marginal graded at 80-90%.  The continuation of the circumflex beyond the first obtuse marginal contains eccentric 40-60% narrowing with haziness.  Normal LV size, and systolic function with EF 55%.  LVEDP is elevated at 19 mmHg.  Proximal to mid circumflex FFR on the 60% narrowing revealed a value of 0.92.  Successful stenting of the 80-90% stenosis in the proximal portion of the first obtuse marginal to less than 10% using a 16 mm Synergy postdilated the 2.5 mm in diameter.  TIMI grade III flow was noted.  Prolonged balloon inflation did not reproduce the patient's presenting complaint.  Atypical chest  pain not related to myocardial ischemia.  RECOMMENDATIONS:  Risk factor modification controlling blood pressure and LDL cholesterol aggressively to guideline targets.  Care in the future not to perform repeated invasive evaluation for atypical/nonischemic pain.  Aspirin and Brilinta for at least 6 months.  Patient Profile     62 y.o. male who initially presented to [facility] with chest pain.  He underwent an Myoview stress test abnormal demonstrating subtle inferior changes with transient ischemic dilation.  A past medical history notable for essential hypertension, HLD, tobacco abuse, and polysubstance abuse with prescription opioids and benzodiazepines.  Chest pain had been present for several days prior to his admission to [facility].  He was noted to have negative enzymes, EKG showed no acute changes.  His pain did however, improve with sub-lingual nitroglycerin.  He was transferred by EMS on IV heparin and nitroglycerin for cardiac cath. He underwent cardiac cath with stenting of the first obtuse marginal to less than 10%.  Unfortunately prolonged balloon inflation at this site did not reproduce the patient's presenting complaint.  There was notable 60% occlusion proximal to mid circumflex.  Assessment & Plan    Chest pain:  Patient underwent cardiac catheterization as above on 02/05/2018 due to chest pain. -Continue morphine 4 mg IV every 3 hours for severe pain -Continue something like nitroglycerin as needed for chest pain - Discontinued nitroglycerin GTT -Continue rosuvastatin 20 mg daily -Continue Brilinta 90 mg daily -Continue ASA -Continue metoprolol succinate 100 mg daily -Continue alprazolam 1 mg nightly for anxiety -Patient heart healthy diet  Hypertension: We will consider the addition of an ACE inhibitor as patient's blood pressure tolerates  Patient will need to be on aspirin and Brilinta for at least 6 months as per post catheter recommendations.  In  addition recommend starting LDL guidelines with aggressive high intensity statins. He is likely to be discharged today.  For questions or updates, please contact [facility] Please consult [email or web address] for contact info under Cardiology/STEMI.     Signed, Lanelle Bal, MD  02/06/2018, 6:02 AM   [phone]

## 2018-02-06 NOTE — Care Management Note (Signed)
Case Management Note  Patient Details  Name: Raymond Melton MRN: [id number] Date of Birth: 1956-06-11  Subjective/Objective:   Pt admitted with CP                Action/Plan:  PTA independent from home with wife.    Expected Discharge Date:  02/06/18               Expected Discharge Plan:  Home/Self Care  In-House Referral:     Discharge planning Services  CM Consult  Post Acute Care Choice:    Choice offered to:     DME Arranged:    DME Agency:     HH Arranged:    HH Agency:     Status of Service:     If discussed at [employer] of [employer] Meetings, dates discussed:    Additional Comments: 02/06/2018 Pt will discharge home on Brilinta - CM submitted benefit check.  CM provided free 30 day card and submitted benefit check.  Pt states he has PCP Dr Lendon ColonelHawks.  CM contacted pharmacy of choice [facility] on [address] [place] and was informed pharmacy can fill prescription Cherylann ParrClaxton, Odes Lolli S, RN 02/06/2018, 11:14 AM

## 2018-02-06 NOTE — Progress Notes (Signed)
CARDIAC REHAB PHASE I   PRE:  Rate/Rhythm: off tele  BP:  Supine:   Sitting: 144/81  Standing:    SaO2:   MODE:  Ambulation: 400 ft   POST:  Rate/Rhythm: 68 per pulse ox  BP:  Supine:   Sitting: 161/81  Standing:    SaO2: 100%RA 1050-1145 Pt walked 400 ft on RA with steady gait and no CP. Tolerated well. Gave pt heart healthy diet, ex ed, reviewed NTG use, and discussed the importance of brilinta with stent. Case manager in to see pt. Discussed CRP 2 and will refer to [facility]. Pt had me write notes for him on ed handouts so he would not forget information. He called his wife and had her on speaker phone because he has 25 steps to get into apartment and he was unsure if she would make sure he could get in apartment safely. She said she would make sure he got in apartment and settled. Jovita Gamma. Gave pt smoking cessation handout and discussed importance of not smoking. Pt with many questions that were answered.    Luetta Nuttingharlene Karsen Nakanishi, RN BSN  02/06/2018 11:40 AM

## 2018-02-06 NOTE — Telephone Encounter (Signed)
I was notified by the operator to contact the patient. He was discharged today after being hospitalize for chest pain for which he underwent PCI. He was discharged home today.  He states that following discharge he noticed worsening bruising of his arm (at which he underwent LHC). He has also had mild chest pain, although he states that this is stable since his PCI. He was encouraged to seek evaluation in the ED to rule out vascular complication of his arm and to further characterize chest pain.

## 2018-02-06 NOTE — Care Management Note (Signed)
Case Management Note  Patient Details  Name: Orpah MelterMichael James Krul MRN: [id number] Date of Birth: [birth year]  Subjective/Objective:   Pt admitted with CP                Action/Plan:  PTA independent from home with wife.    Expected Discharge Date:  02/08/18               Expected Discharge Plan:  Home/Self Care  In-House Referral:     Discharge planning Services  CM Consult  Post Acute Care Choice:    Choice offered to:     DME Arranged:    DME Agency:     HH Arranged:    HH Agency:     Status of Service:     If discussed at [employer] Length of [employer] Meetings, dates discussed:    Additional Comments:  Cherylann ParrClaxton, Vernard Gram S, RN 02/06/2018, 8:52 AM

## 2018-02-06 NOTE — Discharge Summary (Addendum)
Discharge Summary    Patient ID: Raymond Melton,  MRN: [id number], DOB/AGE: June 19, 1956 62 y.o.  Admit date: 02/04/2018 Discharge date: 02/06/2018  Primary Care Provider: Patient, No Pcp Per Primary Cardiologist: No primary care provider on file.  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 Contrast Media [Iodinated Diagnostic Agents] Anaphylaxis and Swelling  . Ioxaglate Anaphylaxis and Swelling  . Metrizamide Anaphylaxis and Swelling  . Other Anaphylaxis, Shortness Of Breath and Swelling    Iv dye  . Codeine Itching    Other reaction(s): Other (See Comments) restless  . Iohexol Other (See Comments)  . Latex   . Lisinopril   . Tuberculin     Other reaction(s): Other (See Comments) Severe skin reaction  . Tuberculin Tests Other (See Comments)    Severe skin reaction  . Valsartan   . Zolpidem Other (See Comments)    confusion    Diagnostic Studies/Procedures    Cardiac Cath:  Normal left main.  Scattered irregularities less than 40% in the proximal mid and distal RCA. PDA also has less than 50% narrowing.  LAD with luminal irregularities but no high-grade obstruction. Areas of irregularity less than 40%.  Circumflex coronary artery gives origin to a small to moderate sized first obtuse marginal graded at 80-90%. The continuation of the circumflex beyond the first obtuse marginal contains eccentric 40-60% narrowing with  haziness.  Normal LV size, and systolic function with EF 55%. LVEDP is elevated at 19 mmHg.  Proximal to mid circumflex FFR on the 60% narrowing revealed a value of 0.92.  Successful stenting of the 80-90% stenosis in the proximal portion of the first obtuse marginal to less than 10% using a 16 mm Synergy postdilated the 2.5 mm in diameter. TIMI grade III flow was noted. Prolonged balloon inflation did not reproduce the patient's presenting complaint.  Atypical chest pain not related to myocardial ischemia.  RECOMMENDATIONS:  Risk factor modification controlling blood pressure and LDL cholesterol aggressively to guideline targets.  Care in the future not to perform repeated invasive evaluation for atypical/nonischemic pain.  Aspirin and Brilinta for at least 6 months. _____________   History of Present Illness     62 y.o. male who initially presented to [facility] with chest pain. He underwent an Myoview stress test that was abnormal demonstrating subtleinferior changes with transient ischemic dilation. Patient has a PMHx notable for essential hypertension, HLD, tobacco abuse, and polysubstance abuse with prescription opioids and benzodiazepines. Chest pain had been present for several days prior to his admission to [facility]. He was noted to have negative enzymes, EKG showed no acute changes. His pain did however, improve with sub-lingual nitroglycerin. He was transferred by EMS on IV heparin and nitroglycerin for cardiac cath.   Hospital Course     Consultants: N/A   He underwent cardiac cath with stenting of the first obtuse marginal to less than 10%.  Unfortunately, prolonged balloon inflation at this site did not reproduce the  patient's presenting complaint.  There was notable 60% occlusion proximal to mid circumflex.  A 16mm Synergy postdilated to 2.105mm diameter was placed in the first obtuse marginal. The patients chest pain improved prior to the procedure  while on nitro gtt. He continued to improve the following day with resolution of his chest pain. He was ambulating freely about the room denying dyspnea, chest pain, nausea, dizziness, or other concerning symptoms. He was advised to stop smoking and provided with scripts for nicotine patches. In addition the patient was advised to continue his beta blocker, as well as the new prescription for ASA and Brilinta until told otherwise by his cardiologist. Appointment were made for follow-up with a midlevel provider in 2-3 weeks and again with Dr. Allyson SabalBerry in 5-6 weeks. He was given strict return precautions and advised to call the cardiology clinic with any questions.   _____________  Discharge Vitals Blood pressure 140/83, pulse 84, temperature (!) 97.5 F (36.4 C), temperature source Oral, resp. rate 13, height 6\' 1"  (1.854 m), weight 81 kg (178 lb 8 oz), SpO2 98 %.  Filed Weights   02/04/18 2023  Weight: 81 kg (178 lb 8 oz)    Labs & Radiologic Studies    CBC Recent Labs    02/05/18 1213 02/06/18 0257  WBC 8.4 13.7*  HGB 14.0 13.3  HCT 38.7* 38.3*  MCV 93.0 93.2  PLT 158 177   Basic Metabolic Panel Recent Labs    16/08/9602/14/19 0325 02/05/18 1213 02/06/18 0257  NA 132*  --  135  K 4.3  --  3.7  CL 101  --  106  CO2 23  --  19*  GLUCOSE 216*  --  137*  BUN 13  --  16  CREATININE 0.93 0.84 0.90  CALCIUM 9.1  --  8.9   Liver Function Tests No results for input(s): AST, ALT, ALKPHOS, BILITOT, PROT, ALBUMIN in the last 72 hours. No results for input(s): LIPASE, AMYLASE in the last 72 hours. Cardiac Enzymes No results for input(s): CKTOTAL, CKMB, CKMBINDEX, TROPONINI in the last 72 hours. BNP Invalid input(s): POCBNP D-Dimer No results for input(s): DDIMER in the last 72 hours. Hemoglobin A1C No results for input(s): HGBA1C in the last 72 hours. Fasting Lipid Panel No results for input(s): CHOL, HDL, LDLCALC, TRIG, CHOLHDL, LDLDIRECT in the last 72 hours. Thyroid Function  Tests Recent Labs    02/05/18 0325  TSH 1.065   _____________  No results found. Disposition   Pt is being discharged home today in good condition.  Follow-up Plans & Appointments     Discharge Instructions    Amb Referral to Cardiac Rehabilitation   Complete by:  As directed    Referring to [place] Phase 2   Diagnosis:  Coronary Stents   Call MD for:  extreme fatigue   Complete by:  As directed    Call MD for:  persistant dizziness or light-headedness   Complete by:  As directed    Call MD for:  severe uncontrolled pain   Complete by:  As directed    Diet - low sodium heart healthy   Complete by:  As directed    Increase activity slowly   Complete by:  As directed       Discharge Medications   Allergies as of 02/06/2018      Reactions   Contrast Media [iodinated Diagnostic Agents] Anaphylaxis, Swelling   Ioxaglate Anaphylaxis, Swelling   Metrizamide Anaphylaxis, Swelling   Other Anaphylaxis, Shortness Of Breath,  Swelling   Iv dye   Codeine Itching   Other reaction(s): Other (See Comments) restless   Iohexol Other (See Comments)   Latex    Lisinopril    Tuberculin    Other reaction(s): Other (See Comments) Severe skin reaction   Tuberculin Tests Other (See Comments)   Severe skin reaction   Valsartan    Zolpidem Other (See Comments)   confusion      Medication List    STOP taking these medications   metFORMIN 500 MG 24 hr tablet Commonly known as:  GLUCOPHAGE-XR   metFORMIN 500 MG tablet Commonly known as:  GLUCOPHAGE   nicotine 14 mg/24hr patch Commonly known as:  NICODERM CQ - dosed in mg/24 hours Replaced by:  nicotine 21 mg/24hr patch     TAKE these medications   ALPRAZolam 1 MG tablet Commonly known as:  XANAX Take 1 mg by mouth at bedtime.   aspirin 81 MG EC tablet Take 1 tablet (81 mg total) by mouth daily.   metoprolol succinate 100 MG 24 hr tablet Commonly known as:  TOPROL-XL Take 1 tablet (100 mg total) by mouth daily. Take  with or immediately following a meal: For high blood pressure   nicotine 21 mg/24hr patch Commonly known as:  NICODERM CQ - dosed in mg/24 hours Place 1 patch (21 mg total) onto the skin daily. Replaces:  nicotine 14 mg/24hr patch   nitroGLYCERIN 0.4 MG SL tablet Commonly known as:  NITROSTAT Place 1 tablet (0.4 mg total) under the tongue every 5 (five) minutes x 3 doses as needed for chest pain.   promethazine 25 MG tablet Commonly known as:  PHENERGAN Take 25 mg by mouth every 12 (twelve) hours as needed for nausea or vomiting.   rosuvastatin 20 MG tablet Commonly known as:  CRESTOR Take 1 tablet (20 mg total) by mouth daily. What changed:    medication strength  how much to take  additional instructions   sitaGLIPtin-metformin 50-1000 MG tablet Commonly known as:  JANUMET Take 1 tablet by mouth 2 (two) times daily with a meal.   ticagrelor 90 MG Tabs tablet Commonly known as:  BRILINTA Take 1 tablet (90 mg total) by mouth 2 (two) times daily.   tiZANidine 4 MG tablet Commonly known as:  ZANAFLEX Take 4 mg by mouth 3 (three) times daily as needed for muscle spasms.   topiramate 25 MG tablet Commonly known as:  TOPAMAX Take one tablet at night for one week, then take 2 tablets at night for one week, then take 3 tablets at night.   traMADol 50 MG tablet Commonly known as:  ULTRAM Take 1 tablet (50 mg total) by mouth every 8 (eight) hours as needed for moderate pain or severe pain.   traZODone 50 MG tablet Commonly known as:  DESYREL Take 50 mg by mouth at bedtime.   Vitamin D (Ergocalciferol) 50000 units Caps capsule Commonly known as:  DRISDOL Take 50,000 Units by mouth 2 (two) times a week.       Aspirin prescribed at discharge?  Yes High Intensity Statin Prescribed? (Lipitor 40-80mg  or Crestor 20-40mg ): Yes Beta Blocker Prescribed? Yes For EF <40%, was ACEI/ARB Prescribed? No: N/A ADP Receptor Inhibitor Prescribed? (i.e. Plavix etc.-Includes Medically  Managed Patients): Yes For EF <40%, Aldosterone Inhibitor Prescribed? No: N/A Was EF assessed during THIS hospitalization? Yes Was Cardiac Rehab II ordered? (Included Medically managed Patients): No: N/A   Outstanding Labs/Studies   N/A  Duration of Discharge Encounter  Greater than 30 minutes including physician time.  Johnnye Lana NP 02/06/2018, 1:11 PM  Agree with note by Dr. Crista Elliot.  Patient had circumflex obtuse marginal branch stenting with a drug-eluting stent by Dr. Katrinka Blazing yesterday. Otherwise he had noncritical CAD. He's had no further chest pain. He is on dual antiplatelet therapy which cannot be interrupted for at least 6-12 months. He is stable for discharge today.  Runell Gess, M.D., FACP, [facility], [place]os [facility] [address] [place], [place]  [address]  [phone] 02/06/2018 3:04 PM

## 2018-02-06 NOTE — Discharge Instructions (Signed)
No driving, lifting or strenuous activity until Monday. Please also refrain from strenuous activity until you are seen in our clinic.  It is essential that you also remain compliant with your medications as listed in this handout.

## 2018-02-07 ENCOUNTER — Telehealth: Payer: Self-pay | Admitting: Internal Medicine

## 2018-02-07 NOTE — Telephone Encounter (Addendum)
Cardiology Moonlighter Note  Returned page from patient. Yesterday noticed some superficial bruising over the R radial access site. Over the course of the day the bruising spread to his R elbow. Has not increased in size since yesterday evening. Called today because he was concerned about the appearance of his arm. He denies any swelling of the arm. The bruising is superficial only and he has no firmness or tenderness. There is minimal pain over the R radial access site. He has normal movement and sensation of the R wrist, hand and fingers.   I reassured the patient that this superficial bruising is an expected finding and that it should improve over the next several days. He will use tylenol PRN for discomfort, warm compresses, and will elevate the arm. I informed him of worrisome symptoms such as those mentioned above. He understands that he should either call back or come to the ED should he develop any of these more worrisome symptoms.   Rosario JacksAnthony Philip Jamea Robicheaux, MD Cardiology Fellow, PGY-5

## 2018-02-10 NOTE — Telephone Encounter (Signed)
Who was the patient?

## 2018-02-12 ENCOUNTER — Ambulatory Visit (INDEPENDENT_AMBULATORY_CARE_PROVIDER_SITE_OTHER): Payer: Medicare Other | Admitting: Cardiovascular Disease

## 2018-02-12 ENCOUNTER — Encounter: Payer: Self-pay | Admitting: Cardiovascular Disease

## 2018-02-12 ENCOUNTER — Ambulatory Visit: Payer: Medicare Other | Admitting: Cardiovascular Disease

## 2018-02-12 VITALS — BP 104/60 | HR 61 | Ht 73.0 in | Wt 179.0 lb

## 2018-02-12 DIAGNOSIS — I25750 Atherosclerosis of native coronary artery of transplanted heart with unstable angina: Secondary | ICD-10-CM | POA: Diagnosis not present

## 2018-02-12 DIAGNOSIS — E785 Hyperlipidemia, unspecified: Secondary | ICD-10-CM | POA: Diagnosis not present

## 2018-02-12 MED FILL — Adenosine IV Soln 12 MG/4ML: INTRAVENOUS | Qty: 16 | Status: AC

## 2018-02-12 NOTE — Patient Instructions (Signed)
Medication Instructions: Your physician recommends that you continue on your current medications as directed. Please refer to the Current Medication list given to you today.  Labwork: Your physician recommends that you return for a FASTING lipid profile and hepatic function panel in 6-8 weeks.  Follow-Up: We request that you follow-up in: 6 months with an extender and in 12 months with Dr San MorelleBerry  You will receive a reminder letter in the mail two months in advance. If you don't receive a letter, please call our office to schedule the follow-up appointment.  If you need a refill on your cardiac medications before your next appointment, please call your pharmacy.

## 2018-02-12 NOTE — Progress Notes (Signed)
02/12/2018 Raymond Melton [facility]   27-Dec-1955  [id number]  Primary Physician Patient, No Pcp Per Primary Cardiologist: Runell Gess MD Raymond Melton, [place], [place]  HPI:  Raymond Melton is a 62 y.o. mildly overweight separated Caucasian male with no children who currently does not work. He was recently discharged from the hospital after being transferred from relative hospital with unstable angina. He has a long history of tobacco abuse smoking one half pack a day until recently when he dramatically reduce this. He does have a history of treated hypertension and diabetes. There is a strong family history of heart disease. He's never had a heart attack or stroke. He was admitted with unstable angina and underwent diagnostic coronary angiography by Dr. Katrinka Melton on 02/05/18 revealed a high-grade obtuse marginal branch stenosis which was successfully stented using a synergy drug-eluting stent with noncritical disease otherwise and normal LV function. His chest pain has markedly improved since that time.    Current Meds  Medication Sig  . ALPRAZolam (XANAX) 1 MG tablet Take 1 mg by mouth at bedtime.  [name] aspirin 81 MG EC tablet Take 1 tablet (81 mg total) by mouth daily.  . metoprolol succinate (TOPROL-XL) 100 MG 24 hr tablet Take 1 tablet (100 mg total) by mouth daily. Take with or immediately following a meal: For high blood pressure  . nicotine (NICODERM CQ - DOSED IN MG/24 HOURS) 21 mg/24hr patch Place 1 patch (21 mg total) onto the skin daily.  . nitroGLYCERIN (NITROSTAT) 0.4 MG SL tablet Place 1 tablet (0.4 mg total) under the tongue every 5 (five) minutes x 3 doses as needed for chest pain.  . promethazine (PHENERGAN) 25 MG tablet Take 25 mg by mouth every 12 (twelve) hours as needed for nausea or vomiting.  . rosuvastatin (CRESTOR) 20 MG tablet Take 1 tablet (20 mg total) by mouth daily.  . sitaGLIPtin-metformin (JANUMET) 50-1000 MG tablet Take 1 tablet by mouth 2 (two) times daily with a meal.   . ticagrelor (BRILINTA) 90 MG TABS tablet Take 1 tablet (90 mg total) by mouth 2 (two) times daily.  [name] tiZANidine (ZANAFLEX) 4 MG tablet Take 4 mg by mouth 3 (three) times daily as needed for muscle spasms.  . traMADol (ULTRAM) 50 MG tablet Take 1 tablet (50 mg total) by mouth every 8 (eight) hours as needed for moderate pain or severe pain.  . Vitamin D, Ergocalciferol, (DRISDOL) 50000 units CAPS capsule Take 50,000 Units by mouth 2 (two) times a week.     Allergies  Allergen Reactions  . Contrast Media [Iodinated Diagnostic Agents] Anaphylaxis and Swelling  . Ioxaglate Anaphylaxis and Swelling  . Metrizamide Anaphylaxis and Swelling  . Other Anaphylaxis, Shortness Of Breath and Swelling    Iv dye  . Codeine Itching    Other reaction(s): Other (See Comments) restless  . Iohexol Other (See Comments)  . Latex   . Lisinopril   . Tuberculin     Other reaction(s): Other (See Comments) Severe skin reaction  . Tuberculin Tests Other (See Comments)    Severe skin reaction  . Valsartan   . Zolpidem Other (See Comments)    confusion    Social History   Socioeconomic History  . Marital status: Married    Spouse name: Not on file  . Number of children: Not on file  . Years of education: Not on file  . Highest education level: Not on file  Occupational History  . Not on file  Social  Needs  . Financial resource strain: Not on file  . Food insecurity:    Worry: Not on file    Inability: Not on file  . Transportation needs:    Medical: Not on file    Non-medical: Not on file  Tobacco Use  . Smoking status: Current Some Day Smoker    Packs/day: 0.50    Types: Cigarettes  . Smokeless tobacco: Never Used  Substance and Sexual Activity  . Alcohol use: Yes    Comment: daily  . Drug use: No  . Sexual activity: Not Currently  Lifestyle  . Physical activity:    Days per week: Not on file    Minutes per session: Not on file  . Stress: Not on file  Relationships  . Social  connections:    Talks on phone: Not on file    Gets together: Not on file    Attends religious service: Not on file    Active member of club or organization: Not on file    Attends meetings of clubs or organizations: Not on file    Relationship status: Not on file  . Intimate partner violence:    Fear of current or ex partner: Not on file    Emotionally abused: Not on file    Physically abused: Not on file    Forced sexual activity: Not on file  Other Topics Concern  . Not on file  Social History Narrative  . Not on file     Review of Systems: General: negative for chills, fever, night sweats or weight changes.  Cardiovascular: negative for chest pain, dyspnea on exertion, edema, orthopnea, palpitations, paroxysmal nocturnal dyspnea or shortness of breath Dermatological: negative for rash Respiratory: negative for cough or wheezing Urologic: negative for hematuria Abdominal: negative for nausea, vomiting, diarrhea, bright red blood per rectum, melena, or hematemesis Neurologic: negative for visual changes, syncope, or dizziness All other systems reviewed and are otherwise negative except as noted above.    Blood pressure 104/60, pulse 61, height 6\' 1"  (1.854 m), weight 179 lb (81.2 kg).  General appearance: alert and no distress Neck: no adenopathy, no carotid bruit, no JVD, supple, symmetrical, trachea midline and thyroid not enlarged, symmetric, no tenderness/mass/nodules Lungs: clear to auscultation bilaterally Heart: regular rate and rhythm, S1, S2 normal, no murmur, click, rub or gallop Extremities: extremities normal, atraumatic, no cyanosis or edema Pulses: 2+ and symmetric Skin: ecchymosis on his right forearm probably related to bleeding from the right radial puncture site. He has a 2+ right radial pulse. Neurologic: Alert and oriented X 3, normal strength and tone. Normal symmetric reflexes. Normal coordination and gait  EKG sinus rhythm at 61 without ST or T-wave  changes. I personally reviewed this EKG  ASSESSMENT AND PLAN:   Hypertension History of essential hypertension with blood pressure measured at 104/60. He is on metoprolol. Continue current meds at current dosing.  Hyperlipidemia History of hyperlipidemia currently on statin therapy. We will recheck a lipid profile in 6-8 weeks.  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 Katrinka Melton on 01/26/18 with a Synergy 2.5 mm x 16 mm long drug-eluting stent.His LV function was normal. He did have an mid AV groove circumflex stenosis that was negative by FFR. Since stent implantation in his neck and arm pain have resolved and he said no further chest pain. He will need to be on dual antiplatelet therapy  uninterrupted for 12 months      Runell GessJonathan J. Willodene Stallings MD [facility] PcFACP,FACC,FAHA, [facility] 02/12/2018 9:34 AM

## 2018-02-12 NOTE — Assessment & Plan Note (Signed)
History of CAD status post recent obtuse marginal branch stenting by Dr. Katrinka BlazingSmith on 01/26/18 with a Synergy 2.5 mm x 16 mm long drug-eluting stent.His LV function was normal. He did have an mid AV groove circumflex stenosis that was negative by FFR. Since stent implantation in his neck and arm pain have resolved and he said no further chest pain. He will need to be on dual antiplatelet therapy uninterrupted for 12 months

## 2018-02-12 NOTE — Assessment & Plan Note (Signed)
History of hyperlipidemia currently on statin therapy. We will recheck a lipid profile in 6-8 weeks.

## 2018-02-12 NOTE — Assessment & Plan Note (Signed)
History of essential hypertension with blood pressure measured at 104/60. He is on metoprolol. Continue current meds at current dosing.

## 2018-02-17 ENCOUNTER — Telehealth: Payer: Self-pay | Admitting: Physician Assistant

## 2018-02-17 NOTE — Telephone Encounter (Signed)
The patient called the answering service after-hours today. Recent dc from hospital with stenting. Feeling flu like symptoms, malaise, congestion, coughing, feeling terrible. Does not have primary. D/w Dr. Mayford Knifeurner (DOD) - she recommends he proceed to urgent care so someone can lay eyes on him to assess him and likely prescribe antiviral if appropriate. The patient verbalized understanding and gratitude. [facility] is nearby and open for another hour - plans to proceed ASAP. Gavyn Zoss PA-C

## 2018-02-24 ENCOUNTER — Ambulatory Visit: Payer: Self-pay | Admitting: Physician Assistant

## 2018-03-09 NOTE — Pre-Procedure Instructions (Signed)
Raymond Melton  03/09/2018      [id number] - [place], [place] - [address] AT [facility] [address] [place] [address] Phone: [phone] Fax: [phone]  [facility] - [place], [place] - [place], [place] - [address] [place] [address] Phone: [phone] Fax: [phone]    Your procedure is scheduled on April 24  Report to [facility] Admitting at 0530 A.M.  Call this number if you have problems the morning of surgery:  [phone]   Remember:  Do not eat food or drink liquids after midnight.  Continue all medications as directed by your physician except follow these medication instructions before surgery below   Take these medicines the morning of surgery with A SIP OF WATER  metoprolol succinate (TOPROL-XL)  nitroGLYCERIN (NITROSTAT) if needed for chest pain traMADol (ULTRAM)  7 days prior to surgery STOP taking any Aspirin(unless otherwise instructed by your surgeon), Aleve, Naproxen, Ibuprofen, Motrin, Advil, Goody's, BC's, all herbal medications, fish oil, and all vitamins  Follow Physicians instructions about ticagrelor (BRILINTA)   WHAT DO I DO ABOUT MY DIABETES MEDICATION?   Do not take oral diabetes medicines (pills) the morning of surgery. sitaGLIPtin-metformin (JANUMET)    How to Manage Your Diabetes Before and After Surgery  Why is it important to control my blood sugar before and after surgery? . Improving blood sugar levels before and after surgery helps healing and can limit problems. . A way of improving blood sugar control is eating a healthy diet by: o  Eating less sugar and carbohydrates o  Increasing activity/exercise o  Talking with your doctor about reaching your blood sugar goals . High blood sugars (greater than 180 mg/dL) can raise your risk of infections and slow your recovery, so you will need to focus on controlling your diabetes during  the weeks before surgery. . Make sure that the doctor who takes care of your diabetes knows about your planned surgery including the date and location.  How do I manage my blood sugar before surgery? . Check your blood sugar at least 4 times a day, starting 2 days before surgery, to make sure that the level is not too high or low. o Check your blood sugar the morning of your surgery when you wake up and every 2 hours until you get to the Short Stay unit. . If your blood sugar is less than 70 mg/dL, you will need to treat for low blood sugar: o Do not take insulin. o Treat a low blood sugar (less than 70 mg/dL) with  cup of clear juice (cranberry or apple), 4 glucose tablets, OR glucose gel. o Recheck blood sugar in 15 minutes after treatment (to make sure it is greater than 70 mg/dL). If your blood sugar is not greater than 70 mg/dL on recheck, call [phone] for further instructions. . Report your blood sugar to the short stay nurse when you get to Short Stay.  . If you are admitted to the hospital after surgery: o Your blood sugar will be checked by the staff and you will probably be given insulin after surgery (instead of oral diabetes medicines) to make sure you have good blood sugar levels. o The goal for blood sugar control after surgery is 80-180 mg/dL.    Do not wear jewelry  Do not wear lotions, powders, or cologne, or deodorant.  Men may shave face and neck.  Do not bring valuables to the hospital.  [facility] is not responsible for any belongings or valuables.  Contacts, dentures or bridgework may not be worn into surgery.  Leave your suitcase in the car.  After surgery it may be brought to your room.  For patients admitted to the hospital, discharge time will be determined by your treatment team.  Patients discharged the day of surgery will not be allowed to drive home.    Special instructions:   [facility]- Preparing For Surgery  Before surgery, you can play an  important role. Because skin is not sterile, your skin needs to be as free of germs as possible. You can reduce the number of germs on your skin by washing with CHG (chlorahexidine gluconate) Soap before surgery.  CHG is an antiseptic cleaner which kills germs and bonds with the skin to continue killing germs even after washing.  Please do not use if you have an allergy to CHG or antibacterial soaps. If your skin becomes reddened/irritated stop using the CHG.  Do not shave (including legs and underarms) for at least 48 hours prior to first CHG shower. It is OK to shave your face.  Please follow these instructions carefully.   1. Shower the NIGHT BEFORE SURGERY and the MORNING OF SURGERY with CHG.   2. If you chose to wash your hair, wash your hair first as usual with your normal shampoo.  3. After you shampoo, rinse your hair and body thoroughly to remove the shampoo.  4. Use CHG as you would any other liquid soap. You can apply CHG directly to the skin and wash gently with a scrungie or a clean washcloth.   5. Apply the CHG Soap to your body ONLY FROM THE NECK DOWN.  Do not use on open wounds or open sores. Avoid contact with your eyes, ears, mouth and genitals (private parts). Wash Face and genitals (private parts)  with your normal soap.  6. Wash thoroughly, paying special attention to the area where your surgery will be performed.  7. Thoroughly rinse your body with warm water from the neck down.  8. DO NOT shower/wash with your normal soap after using and rinsing off the CHG Soap.  9. Pat yourself dry with a CLEAN TOWEL.  10. Wear CLEAN PAJAMAS to bed the night before surgery, wear comfortable clothes the morning of surgery  11. Place CLEAN SHEETS on your bed the night of your first shower and DO NOT SLEEP WITH PETS.    Day of Surgery: Do not apply any deodorants/lotions. Please wear clean clothes to the hospital/surgery center.      Please read over the following fact sheets  that you were given.

## 2018-03-10 ENCOUNTER — Inpatient Hospital Stay (HOSPITAL_COMMUNITY)
Admission: RE | Admit: 2018-03-10 | Discharge: 2018-03-10 | Disposition: A | Discharge status: Home / Self Care | Payer: Self-pay | Source: Ambulatory Visit

## 2018-03-10 NOTE — Progress Notes (Addendum)
Patient did not come to PAT appointment.  His voicemail is not set up and he did not answer numerous calls trying to reach him.

## 2018-03-11 ENCOUNTER — Telehealth: Payer: Self-pay

## 2018-03-11 NOTE — Telephone Encounter (Signed)
   [facility] Pre-operative Risk Assessment    Request for surgical clearance:  1. What type of surgery is being performed? C3-C4 C4-C5 Anterior Cervical Fusion with removal of C5-7  2. When is this surgery scheduled? 03/18/18   3. What type of clearance is required (medical clearance vs. Pharmacy clearance to hold med vs. Both)? Both  4. Are there any medications that need to be held prior to surgery and how long? Brilinta and ASA  5. Practice name and name of physician performing surgery? [facility] (D. Elaina Hoops)  6. What is your office phone and fax number? [phone] Fax: [phone]  7. Anesthesia type (None, local, MAC, general) ? General   Meryl Crutch 03/11/2018, 5:16 PM  _________________________________________________________________   (provider comments below)

## 2018-03-12 NOTE — Telephone Encounter (Signed)
SPOKE PT WITH RECOMMENDATIONS FROM PROVIDER OF POSTPONING SURGERY TILL 01-2019   DUE TO STENT PLACEMENT TAKE UP TO A YEAR BEFORE ANY  MEDICATIONS ADJUSTMENTS CAN BE MADE.  PT WAS UNDERSTANDING AND AGREEABLE WITH DECISION.

## 2018-03-12 NOTE — Telephone Encounter (Signed)
   Primary Cardiologist: Dr. Allyson SabalBerry  Chart reviewed as part of pre-operative protocol coverage.   History of CAD status post recent obtuse marginal branch stenting by Dr. Katrinka BlazingSmith on 01/26/18 with a Synergy 2.5 mm x 16 mm long drug-eluting stent. His LV function was normal. He did have an mid AV groove circumflex stenosis that was negative by FFR. He will need to be on dual antiplatelet therapy uninterrupted for 12 months. He cannot have any elective surgical procedures at this time, given need for DAPT w/o any interruption. Will notify [facility] that surgery will have to be postpone until after 01/27/2019.  Will fax notification and will clear from preop pool.   Raymond LisBrittainy Simmons, PA-C 03/12/2018, 1:45 PM

## 2018-03-15 ENCOUNTER — Other Ambulatory Visit: Payer: Self-pay | Admitting: Cardiovascular Disease

## 2018-03-16 NOTE — Telephone Encounter (Signed)
Rx request sent to pharmacy.  

## 2018-03-17 ENCOUNTER — Ambulatory Visit: Payer: Medicare Other | Admitting: Neurology

## 2018-03-18 ENCOUNTER — Encounter (HOSPITAL_COMMUNITY): Admission: RE | Payer: Self-pay | Source: Ambulatory Visit

## 2018-03-18 ENCOUNTER — Ambulatory Visit (HOSPITAL_COMMUNITY): Admission: RE | Admit: 2018-03-18 | Payer: Medicare Other | Source: Ambulatory Visit | Admitting: Neurosurgery

## 2018-03-18 SURGERY — ANTERIOR CERVICAL DECOMPRESSION/DISCECTOMY FUSION 2 LEVEL/HARDWARE REMOVAL
Anesthesia: General

## 2018-03-24 ENCOUNTER — Ambulatory Visit: Payer: Self-pay | Admitting: Cardiovascular Disease

## 2018-03-25 ENCOUNTER — Encounter: Payer: Self-pay | Admitting: *Deleted

## 2018-03-25 NOTE — Telephone Encounter (Signed)
I called the patient.  The patient indicates that he is separated from his wife, he does not wish for our office to communicate with his wife at all.  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

## 2018-03-25 NOTE — Telephone Encounter (Signed)
I tried to call the patient.  The patient does not answer his telephone, does not have a voice message system.  I will try to call back later.

## 2018-03-25 NOTE — Telephone Encounter (Signed)
Pt called stating he is unsure why any information had been released to his wife when he hadnt signed any type of DPR. Pt states he wanted to speak with Dr. 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

## 2018-03-26 ENCOUNTER — Telehealth: Payer: Self-pay | Admitting: Cardiovascular Disease

## 2018-03-26 NOTE — Telephone Encounter (Signed)
Pt is calling and having bruising all over body and need to speak to nurse. Please call pt

## 2018-03-26 NOTE — Telephone Encounter (Signed)
There is nothing to indicate that acting different is a side effect of Brilinta.   The bruising/bleeding are sometimes  More noticeable to patients in the first few weeks after starting, but this is not unusual.   Cannot switch off Brilinta without Dr. Allyson Sabal approval.

## 2018-03-26 NOTE — Telephone Encounter (Signed)
Spoke with pt who states that he has noticed an increase in bruising over the last week all over his body. He reports having a scratch this morning that bled out more than normal,  to the point of having to change his sheet but eventually stopped. He also states that his wife reports he has been acting differently. Pt unable to describe exact symptoms but state she said "just different." Pt educated on side effects of Brilinta but feels bruising has increased.   Pt offered appointment with Dr. Allyson Sabal today, but states he is unable to come  due to transportation. Routed to Dr. Allyson Sabal and Ilda Basset D for further recommendation.

## 2018-03-26 NOTE — Telephone Encounter (Signed)
Returned call to pt. Unable to leave message.

## 2018-03-30 NOTE — Telephone Encounter (Signed)
Patient calling the office for samples of medication:   1.  What medication and dosage are you requesting samples for? Berilinta   2.  Are you currently out of this medication? Two pills left

## 2018-03-30 NOTE — Telephone Encounter (Signed)
Please make sure patient gets refills of Brilinta.

## 2018-03-30 NOTE — Telephone Encounter (Signed)
Spoke with Pt and advised of recommendation. Also recommended to keep follow up appointment. Verbalized understanding.    Pt also requesting samples and advised that they are available for pick up at the front desk.  Brilinta 90 mg Qty: 4 bottle Lot # [id number] Expire:1/22

## 2018-03-31 ENCOUNTER — Telehealth: Payer: Self-pay

## 2018-03-31 MED ORDER — CLOPIDOGREL BISULFATE 75 MG PO TABS: 75.0000 mg | ORAL_TABLET | Freq: Every day | ORAL | 3 refills | Status: DC

## 2018-03-31 NOTE — Telephone Encounter (Addendum)
Pt came in office to pick up sample and was bleeding from his arm. Bandage applied and spoke with Dr. Allyson Sabal due to pt concerned that the bleeding and bruising has increased.   Per Dr. Allyson Sabal pt is to stop the brilinta 90 mg and start Plavix 75 mg daily. Pt verbalized understanding.  Sample Brilinta 90 mg returned to med cabinet.

## 2018-04-03 ENCOUNTER — Telehealth: Payer: Self-pay | Admitting: Cardiology

## 2018-04-03 NOTE — Telephone Encounter (Signed)
Received page from patient.  He is noted significant bruising in multiple places.  Per review of the notes, for bruising, he was just switched from Brilinta to Plavix.  Otherwise, he denied chest pain shortness of breath, presyncope or any other significant GI bleeding or other symptoms.  I encouraged him to continue taking his Plavix, and that he could touch base with the office during the daytime if he has further issues.  He knows to visit the emergency room on a more urgent basis if he develops any the warning symptoms listed above.

## 2018-04-07 ENCOUNTER — Encounter: Payer: Self-pay | Admitting: Cardiovascular Disease

## 2018-04-07 NOTE — Telephone Encounter (Signed)
Follow Up:; ° ° °Returning your call. °

## 2018-04-07 NOTE — Telephone Encounter (Signed)
This encounter was created in error - please disregard.

## 2018-04-08 ENCOUNTER — Telehealth: Payer: Self-pay | Admitting: Cardiovascular Disease

## 2018-04-08 NOTE — Telephone Encounter (Signed)
[facility] is calling  Stating they have scheduled pt several times for rehab and pt doesn't not show up. Wanted to let us know why pt hasn't had cardiac rehab done. Stated pt may have some memory problems.

## 2018-04-08 NOTE — Telephone Encounter (Signed)
Patient has a follow up appointment 04/22/18 will discuss at ov.

## 2018-04-22 ENCOUNTER — Encounter: Payer: Self-pay | Admitting: Cardiovascular Disease

## 2018-04-22 ENCOUNTER — Ambulatory Visit (INDEPENDENT_AMBULATORY_CARE_PROVIDER_SITE_OTHER): Payer: Medicare Other | Admitting: Cardiovascular Disease

## 2018-04-22 DIAGNOSIS — I25750 Atherosclerosis of native coronary artery of transplanted heart with unstable angina: Secondary | ICD-10-CM | POA: Diagnosis not present

## 2018-04-22 DIAGNOSIS — I10 Essential (primary) hypertension: Secondary | ICD-10-CM | POA: Diagnosis not present

## 2018-04-22 DIAGNOSIS — E7800 Pure hypercholesterolemia, unspecified: Secondary | ICD-10-CM | POA: Diagnosis not present

## 2018-04-22 NOTE — Progress Notes (Signed)
04/22/2018 Raymond Melton [facility]   1955-12-03  [id number]  Primary Physician Patient, No Pcp Per Primary Cardiologist: Runell Gess MD [name], [place], [place]  HPI:  Raymond Melton is a 62 y.o.   mildly overweight separated Caucasian male with no children who currently does not work.  I last saw him in the office 02/12/2018.  He has a long history of tobacco abuse smoking one half pack a day until recently when he dramatically reduce this. He does have a history of treated hypertension and diabetes. There is a strong family history of heart disease. He's never had a heart attack or stroke. He was admitted with unstable angina and underwent diagnostic coronary angiography by Dr. Katrinka Blazing on 02/05/18 revealed a high-grade obtuse marginal branch stenosis which was successfully stented using a synergy drug-eluting stent with noncritical disease otherwise and normal LV function. His chest pain has markedly improved since that time.  Since I saw him 2 months ago he is done well.  He was transition from Brilinta to Plavix because of excessive bleeding.  He is currently asymptomatic.  His lipid profile performed 03/19/2018 revealed total cholesterol of 80, LDL of 17 and HDL of 45.    Current Meds  Medication Sig  . ALPRAZolam (XANAX) 1 MG tablet Take 1 mg by mouth at bedtime.  [name] aspirin 81 MG EC tablet Take 1 tablet (81 mg total) by mouth daily.  . clopidogrel (PLAVIX) 75 MG tablet Take 1 tablet (75 mg total) by mouth daily.  . metoprolol succinate (TOPROL-XL) 100 MG 24 hr tablet Take 1 tablet (100 mg total) by mouth daily. Take with or immediately following a meal: For high blood pressure  . nicotine (NICODERM CQ - DOSED IN MG/24 HOURS) 21 mg/24hr patch Place 1 patch (21 mg total) onto the skin daily.  . nitroGLYCERIN (NITROSTAT) 0.4 MG SL tablet Place 1 tablet (0.4 mg total) under the tongue every 5 (five) minutes x 3 doses as needed for chest pain.  . promethazine (PHENERGAN) 25 MG tablet Take 25  mg by mouth every 12 (twelve) hours as needed for nausea or vomiting.  . rosuvastatin (CRESTOR) 10 MG tablet TAKE ONE TABLET BY MOUTH EVERY DAY  . rosuvastatin (CRESTOR) 20 MG tablet Take 1 tablet (20 mg total) by mouth daily.  . sitaGLIPtin-metformin (JANUMET) 50-1000 MG tablet Take 1 tablet by mouth 2 (two) times daily with a meal.  . tiZANidine (ZANAFLEX) 4 MG tablet Take 4 mg by mouth 3 (three) times daily as needed for muscle spasms.  . Vitamin D, Ergocalciferol, (DRISDOL) 50000 units CAPS capsule Take 50,000 Units by mouth 2 (two) times a week.  . [DISCONTINUED] traMADol (ULTRAM) 50 MG tablet Take 1 tablet (50 mg total) by mouth every 8 (eight) hours as needed for moderate pain or severe pain.     Allergies  Allergen Reactions  . Contrast Media [Iodinated Diagnostic Agents] Anaphylaxis and Swelling  . Ioxaglate Anaphylaxis and Swelling  . Metrizamide Anaphylaxis and Swelling  . Other Anaphylaxis, Shortness Of Breath and Swelling    Iv dye  . Codeine Itching    Other reaction(s): Other (See Comments) restless  . Iohexol Other (See Comments)  . Latex   . Lisinopril   . Tuberculin     Other reaction(s): Other (See Comments) Severe skin reaction  . Tuberculin Tests Other (See Comments)    Severe skin reaction  . Valsartan   . Zolpidem Other (See Comments)    confusion  Social History   Socioeconomic History  . Marital status: Legally Separated    Spouse name: Not on file  . Number of children: Not on file  . Years of education: Not on file  . Highest education level: Not on file  Occupational History  . Not on file  Social Needs  . Financial resource strain: Not on file  . Food insecurity:    Worry: Not on file    Inability: Not on file  . Transportation needs:    Medical: Not on file    Non-medical: Not on file  Tobacco Use  . Smoking status: Current Some Day Smoker    Packs/day: 0.50    Types: Cigarettes  . Smokeless tobacco: Never Used  Substance and  Sexual Activity  . Alcohol use: Yes    Comment: daily  . Drug use: No  . Sexual activity: Not Currently  Lifestyle  . Physical activity:    Days per week: Not on file    Minutes per session: Not on file  . Stress: Not on file  Relationships  . Social connections:    Talks on phone: Not on file    Gets together: Not on file    Attends religious service: Not on file    Active member of club or organization: Not on file    Attends meetings of clubs or organizations: Not on file    Relationship status: Not on file  . Intimate partner violence:    Fear of current or ex partner: Not on file    Emotionally abused: Not on file    Physically abused: Not on file    Forced sexual activity: Not on file  Other Topics Concern  . Not on file  Social History Narrative  . Not on file     Review of Systems: General: negative for chills, fever, night sweats or weight changes.  Cardiovascular: negative for chest pain, dyspnea on exertion, edema, orthopnea, palpitations, paroxysmal nocturnal dyspnea or shortness of breath Dermatological: negative for rash Respiratory: negative for cough or wheezing Urologic: negative for hematuria Abdominal: negative for nausea, vomiting, diarrhea, bright red blood per rectum, melena, or hematemesis Neurologic: negative for visual changes, syncope, or dizziness All other systems reviewed and are otherwise negative except as noted above.    Blood pressure 126/74, pulse (!) 56, height  (1.854 m), weight 174 lb (78.9 kg).  General appearance: alert and no distress Neck: no adenopathy, no carotid bruit, no JVD, supple, symmetrical, trachea midline and thyroid not enlarged, symmetric, no tenderness/mass/nodules Lungs: clear to auscultation bilaterally Heart: regular rate and rhythm, S1, S2 normal, no murmur, click, rub or gallop Extremities: extremities normal, atraumatic, no cyanosis or edema Pulses: 2+ and symmetric Skin: Skin color, texture, turgor  normal. No rashes or lesions Neurologic: Alert and oriented X 3, normal strength and tone. Normal symmetric reflexes. Normal coordination and gait  EKG not performed today  ASSESSMENT AND PLAN:   Hypertension History of essential hypertension her blood pressure measured at 126/74.  He is on metoprolol.  Continue current meds at current dosing.  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 Katrinka Blazing 02/05/2018 with an excellent result.  He had otherwise noncritical CAD with normal LV function.  He was on Brilinta initially which was changed to Plavix because of excessive bruising.  He currently denies chest pain or shortness of breath.  Hyperlipidemia History of hyperlipidemia on high-dose statin therapy with recent lipid profile performed 03/19/2018 revealing a total cholesterol of 80, LDL of 17 and HDL 45.      Runell Gess MD FACP,FACC,FAHA, [facility] 04/22/2018 11:09 AM

## 2018-04-22 NOTE — Patient Instructions (Signed)

## 2018-04-22 NOTE — Assessment & Plan Note (Signed)
History of hyperlipidemia on high-dose statin therapy with recent lipid profile performed 03/19/2018 revealing a total cholesterol of 80, LDL of 17 and HDL 45.

## 2018-04-22 NOTE — Assessment & Plan Note (Signed)
History of essential hypertension her blood pressure measured at 126/74.  He is on metoprolol.  Continue current meds at current dosing.

## 2018-04-22 NOTE — Assessment & Plan Note (Signed)
History of CAD status post circumflex obtuse marginal branch PCI and drug-eluting stenting by Dr. Katrinka Blazing 02/05/2018 with an excellent result.  He had otherwise noncritical CAD with normal LV function.  He was on Brilinta initially which was changed to Plavix because of excessive bruising.  He currently denies chest pain or shortness of breath.

## 2018-05-10 ENCOUNTER — Other Ambulatory Visit: Payer: Self-pay

## 2018-05-10 ENCOUNTER — Emergency Department (HOSPITAL_COMMUNITY): Payer: Medicare Other

## 2018-05-10 ENCOUNTER — Emergency Department (HOSPITAL_COMMUNITY)
Admission: EM | Admit: 2018-05-10 | Discharge: 2018-05-11 | Disposition: A | Discharge status: Home / Self Care | Payer: Medicare Other | Attending: Emergency Medicine | Admitting: Emergency Medicine

## 2018-05-10 ENCOUNTER — Encounter (HOSPITAL_COMMUNITY): Payer: Self-pay | Admitting: Emergency Medicine

## 2018-05-10 DIAGNOSIS — F1310 Sedative, hypnotic or anxiolytic abuse, uncomplicated: Secondary | ICD-10-CM

## 2018-05-10 DIAGNOSIS — Z79899 Other long term (current) drug therapy: Secondary | ICD-10-CM | POA: Insufficient documentation

## 2018-05-10 DIAGNOSIS — F17210 Nicotine dependence, cigarettes, uncomplicated: Secondary | ICD-10-CM | POA: Diagnosis not present

## 2018-05-10 DIAGNOSIS — I10 Essential (primary) hypertension: Secondary | ICD-10-CM | POA: Insufficient documentation

## 2018-05-10 DIAGNOSIS — R471 Dysarthria and anarthria: Secondary | ICD-10-CM | POA: Diagnosis not present

## 2018-05-10 DIAGNOSIS — E785 Hyperlipidemia, unspecified: Secondary | ICD-10-CM | POA: Insufficient documentation

## 2018-05-10 DIAGNOSIS — R4182 Altered mental status, unspecified: Secondary | ICD-10-CM

## 2018-05-10 DIAGNOSIS — I2510 Atherosclerotic heart disease of native coronary artery without angina pectoris: Secondary | ICD-10-CM | POA: Diagnosis not present

## 2018-05-10 DIAGNOSIS — Z7982 Long term (current) use of aspirin: Secondary | ICD-10-CM | POA: Diagnosis not present

## 2018-05-10 DIAGNOSIS — E119 Type 2 diabetes mellitus without complications: Secondary | ICD-10-CM | POA: Diagnosis not present

## 2018-05-10 DIAGNOSIS — Z91040 Latex allergy status: Secondary | ICD-10-CM | POA: Insufficient documentation

## 2018-05-10 DIAGNOSIS — Z7984 Long term (current) use of oral hypoglycemic drugs: Secondary | ICD-10-CM | POA: Insufficient documentation

## 2018-05-10 LAB — COMPREHENSIVE METABOLIC PANEL
ALBUMIN: 3.8 g/dL (ref 3.5–5.0)
ALT: 24 U/L (ref 17–63)
ANION GAP: 8 (ref 5–15)
AST: 25 U/L (ref 15–41)
Alkaline Phosphatase: 95 U/L (ref 38–126)
BUN: 15 mg/dL (ref 6–20)
CO2: 23 mmol/L (ref 22–32)
Calcium: 9 mg/dL (ref 8.9–10.3)
Chloride: 104 mmol/L (ref 101–111)
Creatinine, Ser: 1.03 mg/dL (ref 0.61–1.24)
GFR calc Af Amer: >60 mL/min (ref >60)
GLUCOSE: 143 mg/dL — AB (ref 65–99)
POTASSIUM: 4 mmol/L (ref 3.5–5.1)
Sodium: 135 mmol/L (ref 135–145)
TOTAL PROTEIN: 6.4 g/dL — AB (ref 6.5–8.1)
Total Bilirubin: 0.9 mg/dL (ref 0.3–1.2)

## 2018-05-10 LAB — I-STAT CHEM 8, ED
BUN: 16 mg/dL (ref 6–20)
CHLORIDE: 107 mmol/L (ref 101–111)
Calcium, Ion: 1.08 mmol/L — ABNORMAL LOW (ref 1.15–1.40)
Creatinine, Ser: 0.8 mg/dL (ref 0.61–1.24)
Glucose, Bld: 144 mg/dL — ABNORMAL HIGH (ref 65–99)
HEMATOCRIT: 37 % — AB (ref 39.0–52.0)
Hemoglobin: 12.6 g/dL — ABNORMAL LOW (ref 13.0–17.0)
POTASSIUM: 3.5 mmol/L (ref 3.5–5.1)
SODIUM: 139 mmol/L (ref 135–145)
TCO2: 21 mmol/L — ABNORMAL LOW (ref 22–32)

## 2018-05-10 LAB — DIFFERENTIAL
Abs Immature Granulocytes: 0 10*3/uL (ref 0.0–0.1)
BASOS PCT: 1 %
Basophils Absolute: 0 10*3/uL (ref 0.0–0.1)
EOS ABS: 0.2 10*3/uL (ref 0.0–0.7)
EOS PCT: 3 %
IMMATURE GRANULOCYTES: 1 %
Lymphocytes Relative: 20 %
Lymphs Abs: 1.3 10*3/uL (ref 0.7–4.0)
Monocytes Absolute: 0.5 10*3/uL (ref 0.1–1.0)
Monocytes Relative: 7 %
NEUTROS PCT: 68 %
Neutro Abs: 4.4 10*3/uL (ref 1.7–7.7)

## 2018-05-10 LAB — CBC
HCT: 41.7 % (ref 39.0–52.0)
Hemoglobin: 14.4 g/dL (ref 13.0–17.0)
MCH: 32 pg (ref 26.0–34.0)
MCHC: 34.5 g/dL (ref 30.0–36.0)
MCV: 92.7 fL (ref 78.0–100.0)
PLATELETS: 193 10*3/uL (ref 150–400)
RBC: 4.5 MIL/uL (ref 4.22–5.81)
RDW: 11.7 % (ref 11.5–15.5)
WBC: 6.4 10*3/uL (ref 4.0–10.5)

## 2018-05-10 LAB — PROTIME-INR
INR: 0.97
PROTHROMBIN TIME: 12.8 s (ref 11.4–15.2)

## 2018-05-10 LAB — ETHANOL

## 2018-05-10 LAB — APTT: APTT: 29 s (ref 24–36)

## 2018-05-10 LAB — I-STAT TROPONIN, ED: TROPONIN I, POC: 0 ng/mL (ref 0.00–0.08)

## 2018-05-10 MED ORDER — HALOPERIDOL LACTATE 5 MG/ML IJ SOLN
2.0000 mg | Freq: Once | INTRAMUSCULAR | Status: AC
  Administered 2018-05-10: 2 mg via INTRAVENOUS
  Filled 2018-05-10: qty 1

## 2018-05-10 MED ORDER — NALOXONE HCL 2 MG/2ML IJ SOSY
PREFILLED_SYRINGE | INTRAMUSCULAR | Status: AC
  Filled 2018-05-10: qty 2

## 2018-05-10 MED ORDER — NALOXONE HCL 0.4 MG/ML IJ SOLN
0.4000 mg | INTRAMUSCULAR | Status: DC | PRN
  Filled 2018-05-10: qty 1
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
## 2018-05-10 NOTE — ED Notes (Signed)
CODE STROKE CANCELLED PER KIRKPATRICK @ 2219
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
## 2018-05-11 NOTE — ED Notes (Signed)
Pt up and ambulating around room stating that he is ready to go. Pt back in bed and stating that he has someone that can pick him up so that he can go home.

## 2018-05-11 NOTE — ED Notes (Signed)
Pt taken to the waiting room in a wheelchair, pt asked "how am I supposed to get home", this RN advised pt that we wanted him to stay until he was able to find a ride. Offered for pt to return to a hallway bed, pt refused. Pt sitting in the waiting room.

## 2018-05-11 NOTE — ED Notes (Addendum)
Pt is awake and agitated; looking for a ride home.

## 2018-05-11 NOTE — ED Notes (Signed)
Pt is requesting that we call his wife. However, he doesn't have her number.

## 2018-05-11 NOTE — ED Notes (Signed)
Pt ambulatory at this time, Dr. Preston FleetingGlick present, will continue with discharge process.

## 2018-05-11 NOTE — Discharge Instructions (Addendum)
Return if you are having any problems. 

## 2018-05-11 NOTE — ED Provider Notes (Signed)
Patient signed out by Dr. Clarene DukeLittle, presented with altered mental status that seemed to respond to naloxone.  He had also received haloperidol.  Patient is requesting to be discharged.  I am going to evaluate him.  He is now awake and oriented to person, place, time.  He denies taking any drugs tonight.  Drug screen is pending, but alcohol level is not detectable.  He has been ambulatory in the room.  At this point, he is clearly improving and I do not see any indication for hospitalization.  He is discharged with strict return precautions.   Dione BoozeGlick, Drae Mitzel, MD 05/11/18 [phone]

## 2018-05-12 DIAGNOSIS — E1165 Type 2 diabetes mellitus with hyperglycemia: Secondary | ICD-10-CM

## 2018-05-12 DIAGNOSIS — F1011 Alcohol abuse, in remission: Secondary | ICD-10-CM

## 2018-05-12 DIAGNOSIS — N179 Acute kidney failure, unspecified: Secondary | ICD-10-CM

## 2018-05-12 DIAGNOSIS — F3112 Bipolar disorder, current episode manic without psychotic features, moderate: Secondary | ICD-10-CM

## 2018-05-12 DIAGNOSIS — R109 Unspecified abdominal pain: Secondary | ICD-10-CM

## 2018-05-12 DIAGNOSIS — F418 Other specified anxiety disorders: Secondary | ICD-10-CM

## 2018-05-12 DIAGNOSIS — I2510 Atherosclerotic heart disease of native coronary artery without angina pectoris: Secondary | ICD-10-CM

## 2018-05-13 DIAGNOSIS — F1011 Alcohol abuse, in remission: Secondary | ICD-10-CM | POA: Diagnosis not present

## 2018-05-13 DIAGNOSIS — I2510 Atherosclerotic heart disease of native coronary artery without angina pectoris: Secondary | ICD-10-CM | POA: Diagnosis not present

## 2018-05-13 DIAGNOSIS — R109 Unspecified abdominal pain: Secondary | ICD-10-CM | POA: Diagnosis not present

## 2018-05-13 DIAGNOSIS — N179 Acute kidney failure, unspecified: Secondary | ICD-10-CM | POA: Diagnosis not present

## 2018-05-19 ENCOUNTER — Ambulatory Visit (INDEPENDENT_AMBULATORY_CARE_PROVIDER_SITE_OTHER): Payer: Medicare Other | Admitting: Cardiovascular Disease

## 2018-05-19 ENCOUNTER — Encounter: Payer: Self-pay | Admitting: Cardiovascular Disease

## 2018-05-19 ENCOUNTER — Telehealth: Payer: Self-pay | Admitting: Cardiology

## 2018-05-19 DIAGNOSIS — E7800 Pure hypercholesterolemia, unspecified: Secondary | ICD-10-CM

## 2018-05-19 DIAGNOSIS — I25750 Atherosclerosis of native coronary artery of transplanted heart with unstable angina: Secondary | ICD-10-CM

## 2018-05-19 DIAGNOSIS — I10 Essential (primary) hypertension: Secondary | ICD-10-CM | POA: Diagnosis not present

## 2018-05-19 NOTE — Assessment & Plan Note (Signed)
History of CAD status post cardiac catheterization, PCI and stenting of a small first obtuse marginal branch by Dr. Katrinka BlazingSmith 02/05/2018 with FFR of the AV groove circumflex that was negative at 0.92.  Otherwise he had noncritical CAD with 30% LAD 40% RCA with normal LV function.  He has complained of some atypical chest pain however.  At this point, I reassured him that it is unlikely that he had progression of disease last 3 months.  I do not feel compelled to order either a noninvasive study or recommend repeat catheterization at this time.
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
## 2018-05-19 NOTE — Assessment & Plan Note (Signed)
History of essential hypertension with blood pressure measured today at 112/66.  He is on metoprolol.  Continue current meds at current dosing.

## 2018-05-19 NOTE — Patient Instructions (Signed)
Your physician recommends that you schedule a follow-up appointment in: APP IN 3 MONTHS  Your physician wants you to follow-up in: ONE YEAR WITH DR San MorelleBERRY You will receive a reminder letter in the mail two months in advance. If you don't receive a letter, please call our office to schedule the follow-up appointment.    If you need a refill on your cardiac medications before your next appointment, please call your pharmacy.

## 2018-05-19 NOTE — Assessment & Plan Note (Signed)
History of hyperlipidemia on statin therapy. 
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
## 2018-05-26 ENCOUNTER — Telehealth: Payer: Self-pay | Admitting: Cardiovascular Disease
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
## 2018-06-01 ENCOUNTER — Ambulatory Visit (INDEPENDENT_AMBULATORY_CARE_PROVIDER_SITE_OTHER): Payer: Medicare Other | Admitting: Physician Assistant

## 2018-06-01 ENCOUNTER — Encounter: Payer: Self-pay | Admitting: Physician Assistant

## 2018-06-01 VITALS — BP 132/72 | HR 67 | Ht 73.0 in | Wt 169.0 lb

## 2018-06-01 DIAGNOSIS — R0609 Other forms of dyspnea: Secondary | ICD-10-CM | POA: Diagnosis not present

## 2018-06-01 DIAGNOSIS — R0789 Other chest pain: Secondary | ICD-10-CM

## 2018-06-01 DIAGNOSIS — I2510 Atherosclerotic heart disease of native coronary artery without angina pectoris: Secondary | ICD-10-CM

## 2018-06-01 DIAGNOSIS — R0600 Dyspnea, unspecified: Secondary | ICD-10-CM

## 2018-06-01 DIAGNOSIS — I10 Essential (primary) hypertension: Secondary | ICD-10-CM

## 2018-06-01 NOTE — Patient Instructions (Addendum)
NO MEDICATION CHANGES AT PRESENT.     Raymond LoserRhonda will discuss with Dr Allyson SabalBerry whether to continue with both  Aspirin and Clopidogrel, but for now continue taking both medication daily.   Your physician discussed the importance of regular exercise and recommended that you start or continue a regular exercise program for good health. Walk daily in the coolest part of the day . Each day go or walk a little longer , add 2 to 4 minutes. OXYGEN SATURATION WAS GOOD  98% to 92 %-- increase activity.   KEEP APPOINTMENT WITH DR BERRY  2020 , WILL CANCEL SEPT WITH Raymond Melton  KEEP APPOINTMENT WITH PRIMARY DOCTOR FOR TOMORROW - ASK WHETHER PRIMARY CAN ORDER PHYSICAL THERAPY  If you need a refill on your cardiac medications before your next appointment, please call your pharmacy.
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
## 2018-06-02 ENCOUNTER — Telehealth: Payer: Self-pay | Admitting: Physician Assistant

## 2018-06-02 NOTE — Addendum Note (Signed)
Addended by: Tobin ChadMARTIN, Kathee Tumlin V on: 06/02/2018 04:54 PM   Modules accepted: Orders

## 2018-06-02 NOTE — Telephone Encounter (Signed)
Patient aware he can stop taking aspirin 81 mg daily,but continue plavix (clopidogrel)

## 2018-06-02 NOTE — Telephone Encounter (Signed)
Spoke to patient . Informed patient that RN spoke to PHARMACY yesterday. The cost of NTG sublingual tablets are $15.99 a bottle.  patient states he will call pharmacy to pick medications.

## 2018-06-02 NOTE — Telephone Encounter (Signed)
Spoke with Dr. Allyson SabalBerry.  Because of his bruising, okay to take Plavix alone, stop aspirin. However, because of the recent drug-eluting stent it is very important that he not miss any doses of Plavix.  Theodore DemarkRhonda Nikoletta Varma, PA-C 06/02/2018 4:35 PM

## 2018-06-02 NOTE — Telephone Encounter (Signed)
New Message    Patient is returning call that he received in reference to his visit in the office yesterday. Please call

## 2018-07-03 ENCOUNTER — Inpatient Hospital Stay (HOSPITAL_COMMUNITY)
Admission: AD | Admit: 2018-07-03 | Discharge: 2018-07-04 | DRG: 894 | Discharge status: Against Medical Advice | Payer: Medicare Other | Source: Other Acute Inpatient Hospital | Attending: Internal Medicine | Admitting: Internal Medicine

## 2018-07-03 ENCOUNTER — Telehealth: Payer: Self-pay | Admitting: Cardiovascular Disease

## 2018-07-03 DIAGNOSIS — E440 Moderate protein-calorie malnutrition: Secondary | ICD-10-CM | POA: Diagnosis present

## 2018-07-03 DIAGNOSIS — F10232 Alcohol dependence with withdrawal with perceptual disturbance: Secondary | ICD-10-CM | POA: Diagnosis not present

## 2018-07-03 DIAGNOSIS — F10231 Alcohol dependence with withdrawal delirium: Secondary | ICD-10-CM | POA: Diagnosis present

## 2018-07-03 DIAGNOSIS — Z91040 Latex allergy status: Secondary | ICD-10-CM

## 2018-07-03 DIAGNOSIS — F17210 Nicotine dependence, cigarettes, uncomplicated: Secondary | ICD-10-CM | POA: Diagnosis present

## 2018-07-03 DIAGNOSIS — F3160 Bipolar disorder, current episode mixed, unspecified: Secondary | ICD-10-CM | POA: Diagnosis present

## 2018-07-03 DIAGNOSIS — E119 Type 2 diabetes mellitus without complications: Secondary | ICD-10-CM | POA: Diagnosis present

## 2018-07-03 DIAGNOSIS — B1920 Unspecified viral hepatitis C without hepatic coma: Secondary | ICD-10-CM | POA: Diagnosis present

## 2018-07-03 DIAGNOSIS — I10 Essential (primary) hypertension: Secondary | ICD-10-CM | POA: Diagnosis present

## 2018-07-03 DIAGNOSIS — F19988 Other psychoactive substance use, unspecified with other psychoactive substance-induced disorder: Secondary | ICD-10-CM | POA: Diagnosis present

## 2018-07-03 DIAGNOSIS — Z79899 Other long term (current) drug therapy: Secondary | ICD-10-CM | POA: Diagnosis not present

## 2018-07-03 DIAGNOSIS — Z8619 Personal history of other infectious and parasitic diseases: Secondary | ICD-10-CM | POA: Diagnosis present

## 2018-07-03 DIAGNOSIS — Z955 Presence of coronary angioplasty implant and graft: Secondary | ICD-10-CM | POA: Diagnosis not present

## 2018-07-03 DIAGNOSIS — I2510 Atherosclerotic heart disease of native coronary artery without angina pectoris: Secondary | ICD-10-CM | POA: Diagnosis present

## 2018-07-03 DIAGNOSIS — F10939 Alcohol use, unspecified with withdrawal, unspecified: Secondary | ICD-10-CM | POA: Diagnosis present

## 2018-07-03 DIAGNOSIS — F4310 Post-traumatic stress disorder, unspecified: Secondary | ICD-10-CM | POA: Diagnosis present

## 2018-07-03 DIAGNOSIS — Z7902 Long term (current) use of antithrombotics/antiplatelets: Secondary | ICD-10-CM | POA: Diagnosis not present

## 2018-07-03 DIAGNOSIS — F1120 Opioid dependence, uncomplicated: Secondary | ICD-10-CM | POA: Diagnosis present

## 2018-07-03 DIAGNOSIS — Z885 Allergy status to narcotic agent status: Secondary | ICD-10-CM | POA: Diagnosis not present

## 2018-07-03 DIAGNOSIS — E785 Hyperlipidemia, unspecified: Secondary | ICD-10-CM | POA: Diagnosis present

## 2018-07-03 DIAGNOSIS — I209 Angina pectoris, unspecified: Secondary | ICD-10-CM | POA: Diagnosis present

## 2018-07-03 DIAGNOSIS — Z6821 Body mass index (BMI) 21.0-21.9, adult: Secondary | ICD-10-CM

## 2018-07-03 DIAGNOSIS — Z888 Allergy status to other drugs, medicaments and biological substances status: Secondary | ICD-10-CM

## 2018-07-03 DIAGNOSIS — R079 Chest pain, unspecified: Secondary | ICD-10-CM | POA: Diagnosis not present

## 2018-07-03 DIAGNOSIS — R451 Restlessness and agitation: Secondary | ICD-10-CM | POA: Diagnosis not present

## 2018-07-03 DIAGNOSIS — Z91041 Radiographic dye allergy status: Secondary | ICD-10-CM

## 2018-07-03 DIAGNOSIS — F10239 Alcohol dependence with withdrawal, unspecified: Secondary | ICD-10-CM | POA: Diagnosis present

## 2018-07-03 DIAGNOSIS — E872 Acidosis: Secondary | ICD-10-CM | POA: Diagnosis present

## 2018-07-03 DIAGNOSIS — R296 Repeated falls: Secondary | ICD-10-CM | POA: Diagnosis present

## 2018-07-03 DIAGNOSIS — E441 Mild protein-calorie malnutrition: Secondary | ICD-10-CM

## 2018-07-03 LAB — TROPONIN I: Troponin I: <0.03 ng/mL (ref <0.03)

## 2018-07-03 LAB — CBC WITH DIFFERENTIAL/PLATELET
Abs Immature Granulocytes: 0 10*3/uL (ref 0.0–0.1)
BASOS ABS: 0 10*3/uL (ref 0.0–0.1)
Basophils Relative: 0 %
EOS ABS: 0.1 10*3/uL (ref 0.0–0.7)
EOS PCT: 1 %
HCT: 47.6 % (ref 39.0–52.0)
Hemoglobin: 16.9 g/dL (ref 13.0–17.0)
Immature Granulocytes: 0 %
Lymphocytes Relative: 17 %
Lymphs Abs: 1.2 10*3/uL (ref 0.7–4.0)
MCH: 31.8 pg (ref 26.0–34.0)
MCHC: 35.5 g/dL (ref 30.0–36.0)
MCV: 89.6 fL (ref 78.0–100.0)
Monocytes Absolute: 0.6 10*3/uL (ref 0.1–1.0)
Monocytes Relative: 9 %
Neutro Abs: 5.2 10*3/uL (ref 1.7–7.7)
Neutrophils Relative %: 73 %
Platelets: 218 10*3/uL (ref 150–400)
RBC: 5.31 MIL/uL (ref 4.22–5.81)
RDW: 12.1 % (ref 11.5–15.5)
WBC: 7.1 10*3/uL (ref 4.0–10.5)

## 2018-07-03 LAB — BRAIN NATRIURETIC PEPTIDE: B Natriuretic Peptide: 28 pg/mL (ref 0.0–100.0)

## 2018-07-03 LAB — HEMOGLOBIN A1C
HEMOGLOBIN A1C: 5.8 % — AB (ref 4.8–5.6)
MEAN PLASMA GLUCOSE: 119.76 mg/dL

## 2018-07-03 LAB — ACETAMINOPHEN LEVEL

## 2018-07-03 LAB — COMPREHENSIVE METABOLIC PANEL
ALT: 23 U/L (ref 0–44)
ANION GAP: 19 — AB (ref 5–15)
AST: 26 U/L (ref 15–41)
Albumin: 4.4 g/dL (ref 3.5–5.0)
Alkaline Phosphatase: 148 U/L — ABNORMAL HIGH (ref 38–126)
BILIRUBIN TOTAL: 1.9 mg/dL — AB (ref 0.3–1.2)
BUN: 11 mg/dL (ref 8–23)
CO2: 16 mmol/L — ABNORMAL LOW (ref 22–32)
Calcium: 9.8 mg/dL (ref 8.9–10.3)
Chloride: 103 mmol/L (ref 98–111)
Creatinine, Ser: 1.09 mg/dL (ref 0.61–1.24)
Glucose, Bld: 111 mg/dL — ABNORMAL HIGH (ref 70–99)
POTASSIUM: 3.4 mmol/L — AB (ref 3.5–5.1)
Sodium: 138 mmol/L (ref 135–145)
TOTAL PROTEIN: 7.6 g/dL (ref 6.5–8.1)

## 2018-07-03 LAB — GLUCOSE, CAPILLARY
GLUCOSE-CAPILLARY: 150 mg/dL — AB (ref 70–99)
GLUCOSE-CAPILLARY: 181 mg/dL — AB (ref 70–99)
Glucose-Capillary: 131 mg/dL — ABNORMAL HIGH (ref 70–99)

## 2018-07-03 LAB — MAGNESIUM: MAGNESIUM: 1.7 mg/dL (ref 1.7–2.4)

## 2018-07-03 LAB — BETA-HYDROXYBUTYRIC ACID: Beta-Hydroxybutyric Acid: 0.1 mmol/L (ref 0.05–0.27)

## 2018-07-03 LAB — LACTIC ACID, PLASMA: Lactic Acid, Venous: 1.4 mmol/L (ref 0.5–1.9)

## 2018-07-03 LAB — SALICYLATE LEVEL: Salicylate Lvl: <7 mg/dL (ref 2.8–30.0)

## 2018-07-03 LAB — PHOSPHORUS: PHOSPHORUS: 2.1 mg/dL — AB (ref 2.5–4.6)

## 2018-07-03 LAB — MRSA PCR SCREENING: MRSA BY PCR: NEGATIVE

## 2018-07-03 LAB — HEPARIN LEVEL (UNFRACTIONATED): HEPARIN UNFRACTIONATED: 0.36 [IU]/mL (ref 0.30–0.70)

## 2018-07-03 LAB — PROTIME-INR
INR: 0.96
PROTHROMBIN TIME: 12.7 s (ref 11.4–15.2)

## 2018-07-03 LAB — APTT: APTT: 25 s (ref 24–36)

## 2018-07-03 LAB — TSH: TSH: 3.519 u[IU]/mL (ref 0.350–4.500)

## 2018-07-03 LAB — ETHANOL: Alcohol, Ethyl (B): <10 mg/dL (ref <10)

## 2018-07-03 MED ORDER — HEPARIN (PORCINE) IN NACL 100-0.45 UNIT/ML-% IJ SOLN
1050.0000 [IU]/h | INTRAMUSCULAR | Status: DC
  Administered 2018-07-03: 900 [IU]/h via INTRAVENOUS
  Filled 2018-07-03: qty 250

## 2018-07-03 MED ORDER — ATORVASTATIN CALCIUM 40 MG PO TABS: 40.0000 mg | ORAL_TABLET | Freq: Every day | ORAL | Status: DC

## 2018-07-03 MED ORDER — HALOPERIDOL LACTATE 5 MG/ML IJ SOLN
5.0000 mg | Freq: Once | INTRAMUSCULAR | Status: AC
  Administered 2018-07-03: 5 mg via INTRAVENOUS

## 2018-07-03 MED ORDER — TRAMADOL HCL 50 MG PO TABS
50.0000 mg | ORAL_TABLET | Freq: Four times a day (QID) | ORAL | Status: DC | PRN
Start: 2018-07-03 — End: 2018-07-04

## 2018-07-03 MED ORDER — MORPHINE SULFATE (PF) 2 MG/ML IV SOLN
2.0000 mg | Freq: Once | INTRAVENOUS | Status: AC
  Administered 2018-07-03: 2 mg via INTRAVENOUS

## 2018-07-03 MED ORDER — ONDANSETRON HCL 4 MG PO TABS: 4.0000 mg | ORAL_TABLET | Freq: Four times a day (QID) | ORAL | Status: DC | PRN

## 2018-07-03 MED ORDER — ROSUVASTATIN CALCIUM 20 MG PO TABS: 20.0000 mg | ORAL_TABLET | Freq: Every day | ORAL | Status: DC

## 2018-07-03 MED ORDER — DEXMEDETOMIDINE HCL IN NACL 400 MCG/100ML IV SOLN
0.2000 ug/kg/h | INTRAVENOUS | Status: DC
  Administered 2018-07-03: 0.5 ug/kg/h via INTRAVENOUS
  Administered 2018-07-04: 0.2 ug/kg/h via INTRAVENOUS
  Filled 2018-07-03 (×2): qty 100

## 2018-07-03 MED ORDER — LORAZEPAM 2 MG/ML IJ SOLN
1.0000 mg | INTRAMUSCULAR | Status: DC | PRN
  Administered 2018-07-04: 2 mg via INTRAVENOUS
  Filled 2018-07-03: qty 1

## 2018-07-03 MED ORDER — ASPIRIN EC 81 MG PO TBEC: 81.0000 mg | DELAYED_RELEASE_TABLET | Freq: Every day | ORAL | Status: DC

## 2018-07-03 MED ORDER — NITROGLYCERIN 0.4 MG SL SUBL: 0.4000 mg | SUBLINGUAL_TABLET | SUBLINGUAL | Status: DC | PRN

## 2018-07-03 MED ORDER — TRAZODONE HCL 50 MG PO TABS: 25.0000 mg | ORAL_TABLET | Freq: Every evening | ORAL | Status: DC | PRN

## 2018-07-03 MED ORDER — FOLIC ACID 1 MG PO TABS: 1.0000 mg | ORAL_TABLET | Freq: Every day | ORAL | Status: DC

## 2018-07-03 MED ORDER — VITAMIN B-1 100 MG PO TABS: 100.0000 mg | ORAL_TABLET | Freq: Every day | ORAL | Status: DC

## 2018-07-03 MED ORDER — LORAZEPAM 1 MG PO TABS: 1.0000 mg | ORAL_TABLET | Freq: Four times a day (QID) | ORAL | Status: DC

## 2018-07-03 MED ORDER — ONDANSETRON HCL 4 MG/2ML IJ SOLN: 4.0000 mg | Freq: Four times a day (QID) | INTRAMUSCULAR | Status: DC | PRN

## 2018-07-03 MED ORDER — ACETAMINOPHEN 325 MG PO TABS: 650.0000 mg | ORAL_TABLET | Freq: Four times a day (QID) | ORAL | Status: DC | PRN

## 2018-07-03 MED ORDER — NICOTINE 21 MG/24HR TD PT24
21.0000 mg | MEDICATED_PATCH | Freq: Every day | TRANSDERMAL | Status: DC
  Administered 2018-07-03: 21 mg via TRANSDERMAL
  Filled 2018-07-03: qty 1

## 2018-07-03 MED ORDER — POTASSIUM CHLORIDE IN NACL 20-0.9 MEQ/L-% IV SOLN
INTRAVENOUS | Status: DC
  Administered 2018-07-03: 14:00:00 via INTRAVENOUS
  Filled 2018-07-03: qty 1000

## 2018-07-03 MED ORDER — ASPIRIN 81 MG PO CHEW
324.0000 mg | CHEWABLE_TABLET | ORAL | Status: AC
  Administered 2018-07-03: 324 mg via ORAL
  Filled 2018-07-03: qty 4

## 2018-07-03 MED ORDER — POLYETHYLENE GLYCOL 3350 17 G PO PACK: 17.0000 g | PACK | Freq: Every day | ORAL | Status: DC | PRN

## 2018-07-03 MED ORDER — ADULT MULTIVITAMIN W/MINERALS CH: 1.0000 | ORAL_TABLET | Freq: Every day | ORAL | Status: DC

## 2018-07-03 MED ORDER — NOREPINEPHRINE 4 MG/250ML-% IV SOLN
0.0000 ug/min | INTRAVENOUS | Status: DC
Start: 2018-07-03 — End: 2018-07-03

## 2018-07-03 MED ORDER — LORAZEPAM 1 MG PO TABS: 1.0000 mg | ORAL_TABLET | Freq: Four times a day (QID) | ORAL | Status: DC | PRN

## 2018-07-03 MED ORDER — SODIUM CHLORIDE 0.9% FLUSH
3.0000 mL | Freq: Two times a day (BID) | INTRAVENOUS | Status: DC
Start: 2018-07-03 — End: 2018-07-04
  Administered 2018-07-03 (×2): 3 mL via INTRAVENOUS

## 2018-07-03 MED ORDER — POTASSIUM PHOSPHATES 15 MMOLE/5ML IV SOLN
30.0000 mmol | Freq: Once | INTRAVENOUS | Status: AC
  Administered 2018-07-03: 30 mmol via INTRAVENOUS
  Filled 2018-07-03: qty 10

## 2018-07-03 MED ORDER — ACETAMINOPHEN 325 MG PO TABS: 650.0000 mg | ORAL_TABLET | ORAL | Status: DC | PRN

## 2018-07-03 MED ORDER — LORAZEPAM 2 MG/ML IJ SOLN
0.0000 mg | Freq: Four times a day (QID) | INTRAMUSCULAR | Status: DC
  Administered 2018-07-03: 2 mg via INTRAVENOUS
  Filled 2018-07-03: qty 1

## 2018-07-03 MED ORDER — LORAZEPAM 2 MG/ML IJ SOLN: 1.0000 mg | Freq: Four times a day (QID) | INTRAMUSCULAR | Status: DC | PRN

## 2018-07-03 MED ORDER — LORAZEPAM 1 MG PO TABS
4.0000 mg | ORAL_TABLET | Freq: Four times a day (QID) | ORAL | Status: DC
  Administered 2018-07-03 – 2018-07-04 (×3): 4 mg via ORAL
  Filled 2018-07-03 (×3): qty 4

## 2018-07-03 MED ORDER — ASPIRIN 300 MG RE SUPP: 300.0000 mg | RECTAL | Status: AC

## 2018-07-03 MED ORDER — HEPARIN BOLUS VIA INFUSION
4000.0000 [IU] | Freq: Once | INTRAVENOUS | Status: AC
  Administered 2018-07-03: 4000 [IU] via INTRAVENOUS
  Filled 2018-07-03: qty 4000

## 2018-07-03 MED ORDER — LORAZEPAM 1 MG PO TABS: 2.0000 mg | ORAL_TABLET | Freq: Four times a day (QID) | ORAL | Status: DC

## 2018-07-03 MED ORDER — LORAZEPAM 2 MG/ML IJ SOLN: 0.0000 mg | Freq: Two times a day (BID) | INTRAMUSCULAR | Status: DC

## 2018-07-03 MED ORDER — METOPROLOL SUCCINATE ER 100 MG PO TB24
100.0000 mg | ORAL_TABLET | Freq: Every day | ORAL | Status: DC
  Administered 2018-07-03: 100 mg via ORAL
  Filled 2018-07-03: qty 1

## 2018-07-03 MED ORDER — THIAMINE HCL 100 MG/ML IJ SOLN
100.0000 mg | Freq: Every day | INTRAMUSCULAR | Status: DC
  Administered 2018-07-03: 100 mg via INTRAVENOUS
  Filled 2018-07-03: qty 2

## 2018-07-03 MED ORDER — HALOPERIDOL LACTATE 5 MG/ML IJ SOLN
INTRAMUSCULAR | Status: AC
  Filled 2018-07-03: qty 1

## 2018-07-03 MED ORDER — CLOPIDOGREL BISULFATE 75 MG PO TABS: 75.0000 mg | ORAL_TABLET | Freq: Every day | ORAL | Status: DC

## 2018-07-03 MED ORDER — ACETAMINOPHEN 650 MG RE SUPP: 650.0000 mg | Freq: Four times a day (QID) | RECTAL | Status: DC | PRN

## 2018-07-03 MED ORDER — MORPHINE SULFATE (PF) 2 MG/ML IV SOLN
INTRAVENOUS | Status: AC
  Filled 2018-07-03: qty 1

## 2018-07-03 MED ORDER — THIAMINE HCL 100 MG/ML IJ SOLN
Freq: Once | INTRAVENOUS | Status: AC
  Administered 2018-07-03: 14:00:00 via INTRAVENOUS
  Filled 2018-07-03: qty 1000

## 2018-07-03 MED ORDER — INSULIN ASPART 100 UNIT/ML ~~LOC~~ SOLN
0.0000 [IU] | Freq: Three times a day (TID) | SUBCUTANEOUS | Status: DC
  Administered 2018-07-03: 3 [IU] via SUBCUTANEOUS
  Administered 2018-07-04: 2 [IU] via SUBCUTANEOUS

## 2018-07-03 MED ORDER — HYDRALAZINE HCL 20 MG/ML IJ SOLN: 10.0000 mg | INTRAMUSCULAR | Status: DC | PRN

## 2018-07-03 NOTE — Care Management Note (Addendum)
Case Management Note  Patient Details  Name: Raymond Melton MRN: [id number] Date of Birth: [birth year]  Subjective/Objective:   From home, [facility] for multiple falls and chest pain. PMH significant for hypertension, EtOH abuse, and type 2 diabetes mellitus.  etoh withdrawal.   Conts on precedex , heparin.         Action/Plan: NCM will follow for transition of care needs.   Expected Discharge Date:                  Expected Discharge Plan:     In-House Referral:     Discharge planning Services  CM Consult  Post Acute Care Choice:    Choice offered to:     DME Arranged:    DME Agency:     HH Arranged:    HH Agency:     Status of Service:  In process, will continue to follow  If discussed at Long Length of Stay Meetings, dates discussed:    Additional Comments:  Leone Havenaylor, Marcie Shearon Clinton, RN 07/03/2018, 4:43 PM

## 2018-07-03 NOTE — Consult Note (Signed)
PULMONARY / CRITICAL CARE MEDICINE   Name: Raymond Melton MRN: [id number] DOB: Oct 27, 1956    ADMISSION DATE:  07/03/2018 CONSULTATION DATE:  8/9  REFERRING MD:  Willette PaSheehan   CHIEF COMPLAINT:  Alcohol withdrawal/delirium tremens  HISTORY OF PRESENT ILLNESS:   62 year old male patient with a prior history of coronary artery disease, prior posttraumatic stress syndrome, hypertension, alcohol abuse, multiple falls.  Most recent cardiac cath February 05, 2018.  Underwent PCI.  Initially presented to [facility] on 8/8 with chief complaint of chest pain, he reported the pain to be intermittent.  Typically resolves spontaneously.  He had been having multiple falls at home.  Denied any head trauma.  Denied any shortness of breath.  There was concern about new T wave inversion in V2, 1, and aVL he was therefore transferred to [facility] for cardiac evaluation.  Early in the a.m. hours on 8/9 prior to [facility] he began to become visibly tremulous, he was agitated, restless, and aggressive towards staff.  He reported his last drink was the day prior.  He was given several doses of benzodiazepines as well as a one-time dose of phenobarbital.He was initially to be admitted to stepdown unit, however his need for frequent PRN's this is sedated transferred to intensive care and consideration for Precedex protocol.  PAST MEDICAL HISTORY :  He  has a past medical history of Alcohol abuse, Altered mental status (12/17/2017), Chronic pain, Diabetes mellitus without complication (HCC), Hepatitis C, Hypertension, Immune deficiency disorder (HCC), Kidney stones, and TB lung, latent.  PAST SURGICAL HISTORY: He  has a past surgical history that includes Ankle surgery; Hernia repair; Cholecystectomy; Eye surgery; Abdominal surgery; Wrist surgery; Elbow surgery; LEFT HEART CATH AND CORONARY ANGIOGRAPHY (N/A, 02/05/2018); INTRAVASCULAR PRESSURE WIRE/FFR STUDY (N/A, 02/05/2018); and CORONARY STENT  INTERVENTION (N/A, 02/05/2018).  Allergies  Allergen Reactions  . Contrast Media [Iodinated Diagnostic Agents] Anaphylaxis and Swelling  . Ioxaglate Anaphylaxis and Swelling  . Metrizamide Anaphylaxis and Swelling  . Other Anaphylaxis, Shortness Of Breath and Swelling    Iv dye  . Codeine Itching    Other reaction(s): Other (See Comments) restless  . Iohexol Other (See Comments)  . Latex   . Lisinopril   . Tuberculin     Other reaction(s): Other (See Comments) Severe skin reaction  . Tuberculin Tests Other (See Comments)    Severe skin reaction  . Valsartan   . Zolpidem Other (See Comments)    confusion    No current facility-administered medications on file prior to encounter.    Current Outpatient Medications on File Prior to Encounter  Medication Sig  . ALPRAZolam (XANAX) 1 MG tablet Take 1 mg by mouth at bedtime.  . clopidogrel (PLAVIX) 75 MG tablet Take 1 tablet (75 mg total) by mouth daily.  . metoprolol succinate (TOPROL-XL) 100 MG 24 hr tablet Take 1 tablet (100 mg total) by mouth daily. Take with or immediately following a meal: For high blood pressure  . nicotine (NICODERM CQ - DOSED IN MG/24 HOURS) 21 mg/24hr patch Place 1 patch (21 mg total) onto the skin daily.  . nitroGLYCERIN (NITROSTAT) 0.4 MG SL tablet Place 1 tablet (0.4 mg total) under the tongue every 5 (five) minutes x 3 doses as needed for chest pain.  . promethazine (PHENERGAN) 25 MG tablet Take 25 mg by mouth every 12 (twelve) hours as needed for nausea or vomiting.  . rosuvastatin (CRESTOR) 10 MG tablet TAKE ONE TABLET BY MOUTH EVERY DAY  . rosuvastatin (  CRESTOR) 20 MG tablet Take 1 tablet (20 mg total) by mouth daily.  . sitaGLIPtin-metformin (JANUMET) 50-1000 MG tablet Take 1 tablet by mouth 2 (two) times daily with a meal.  . tiZANidine (ZANAFLEX) 4 MG tablet Take 4 mg by mouth 3 (three) times daily as needed for muscle spasms.  . Vitamin D, Ergocalciferol, (DRISDOL) 50000 units CAPS capsule Take  50,000 Units by mouth 2 (two) times a week.    FAMILY HISTORY:  His family history includes Cancer in his brother and sister; Depression in his mother; Heart attack in his brother and father; Hypertension in his brother, father, mother, and sister; Leukemia in his mother; Stroke in his father.  SOCIAL HISTORY: He  reports that he has been smoking cigarettes. He has been smoking about 0.50 packs per day. He has never used smokeless tobacco. He reports that he drinks alcohol. He reports that he does not use drugs.  REVIEW OF SYSTEMS:   Unable to obtain  SUBJECTIVE:  Currently wants to go home.  Continues to adamantly deny alcohol use.  States "do not make it I drink just a shot, sometimes a little bit over the line if they fill it that way"   VITAL SIGNS: Blood Pressure (Abnormal) 158/114   Pulse 94   Temperature 97.6 F (36.4 C) (Axillary)   Respiration (Abnormal) 22   Height 6\' 1"  (1.854 m)   Weight 74.6 kg   Oxygen Saturation 100%   Body Mass Index 21.70 kg/m    HEMODYNAMICS:    VENTILATOR SETTINGS:    INTAKE / OUTPUT: No intake/output data recorded.  PHYSICAL EXAMINATION: General: 62 year old white male who is currently agitated, loud, and having visual hallucinations in the room.  He is yelling at staff, and difficult to redirect Neuro: Awake, restless, moves all extremity. HEENT: Normocephalic atraumatic no jugular venous distention Cardiovascular: Regular rate and rhythm tachycardic Lungs: Clear to auscultation Abdomen: Soft nontender Musculoskeletal: Multiple healed scars on knees following prior falls.  Equal strength and bulk   LABS:  BMET Recent Labs  Lab 07/03/18 1326  NA 138  K 3.4*  CL 103  CO2 16*  BUN 11  CREATININE 1.09  GLUCOSE 111*    Electrolytes Recent Labs  Lab 07/03/18 1326  CALCIUM 9.8  MG 1.7  PHOS 2.1*    CBC Recent Labs  Lab 07/03/18 1326  WBC 7.1  HGB 16.9  HCT 47.6  PLT 218    Coag's Recent Labs  Lab  07/03/18 1326  APTT 25  INR 0.96    Sepsis Markers No results for input(s): LATICACIDVEN, PROCALCITON, O2SATVEN in the last 168 hours.  ABG No results for input(s): PHART, PCO2ART, PO2ART in the last 168 hours.  Liver Enzymes Recent Labs  Lab 07/03/18 1326  AST 26  ALT 23  ALKPHOS 148*  BILITOT 1.9*  ALBUMIN 4.4    Cardiac Enzymes Recent Labs  Lab 07/03/18 1326  TROPONINI <0.03    Glucose Recent Labs  Lab 07/03/18 1148  GLUCAP 150*     STUDIES:    CULTURES:   ANTIBIOTICS:  SIGNIFICANT EVENTS:   ASSESSMENT / PLAN:   EtOH withdrawal with DT's - EtOH level at [place] found to be 0.  Started off calm and appropriate but later became agitated and aggressive.  He received multiple doses of ativan, valium, phenobarb before being transferred to [facility].  Initially admitted to SDU but later transferred to ICU for initiation of precedex given ongoing hallucinations and agitation. Plan: Continue precedex infusion with  PRN ativan. Continue banana bag. EtOH cessation counseling. safety sitter  F/u UDS Send apap level Trend LFTs   Chest pain - has hx of coronary occlusion with PCI in March 2019 (Dr. Gery Pray).  TWI's noted V1, V2, aVL. Plan: Continue heparin for now. Trend troponin. Continue preadmission clopidogrel. Might need cards consult.   Hx HTN, HLD. Plan: Monitor hemodynamics. Hydralazine PRN. Hold preadmission crestor for now.  Anion gap metabolic acidosis.  Not hypotensive. Not sure about etiology.  Doubt sepsis. No evidence of seizure.  Plan Ck salicylate level, lactic acid, send beta hydroxybutyric acid   Fluid and electrolyte imbalance: hypokalemia, hypophosphatemia  Plan Replace and recheck  Hx DM. Plan: SSI. Hold preadmission janumet.   Hx bipolar affective disorder, PTSD, chronic pain. Plan: Continue supportive care for DT's. Hold preadmission alprazolam, tizanidine.   Simonne Martinet ACNP-BC [facility]  Pulmonary/Critical Care Pager # [phone] OR # [phone] if no answer  07/03/2018, 3:36 PM

## 2018-07-03 NOTE — Care Management (Signed)
Awaiting medication reconciliation prior to ordering home meds.

## 2018-07-03 NOTE — Progress Notes (Signed)
Initial Nutrition Assessment  DOCUMENTATION CODES:   Non-severe (moderate) malnutrition in context of social or environmental circumstances  INTERVENTION:   - Continue MVI with minerals daily  - RD to order double protein with all meal trays  - Encourage adequate PO intake  - Pt has declined oral nutrition supplements at this time but is willing to reconsider once he feels better. Recommend Glucerna Shake po TID, each supplement provides 220 kcal and 10 grams of protein.  NUTRITION DIAGNOSIS:   Moderate Malnutrition related to social / environmental circumstances (polysubstance abuse) as evidenced by mild fat depletion, moderate muscle depletion, percent weight loss (8.6% weight loss in < 2 months).  GOAL:   Patient will meet greater than or equal to 90% of their needs  MONITOR:   PO intake, Weight trends, Labs  REASON FOR ASSESSMENT:   Consult Assessment of nutrition requirement/status, Poor PO  ASSESSMENT:   62 year old male who presented to [facility] from [facility] ED for multiple falls and chest pain. PMH significant for hypertension, EtOH abuse, and type 2 diabetes mellitus.  Spoke with pt at bedside. RN at bedside providing nursing care at time of visit.  Pt reports that he typically has a good appetite but that he started being less hungry and eating less after his "heart surgery." Noted pt may be referring to heart cath in March 2019. 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 Per weight history in chart, pt has lost 15.4 lbs since 05/10/18. This is an 8.6% weight loss in less than 2 months which is significant for timeframe.  Pt reports that he typically consumes 2 meals daily. His first meal is lunch at the senior center where he has "a full course meal." Pt reports a meal might include roast pork, peaches, and milk. Pt states that he eats dinner at home which  may include a frozen pizza or beef ravioli "from the can."  Pt states that he was on a "heart" diet during his last admission and that he has been trying to follow that diet at home. Pt reports he eats "a lot of [place], chicken, and fish." Pt also states he drinks a half gallon of chocolate milk daily.  Pt is concerned about his frequent falls, stating that he "blacks out." When asked about EtOH consumption, pt states "I rarely drink alcohol, maybe 1-2 drinks a month." This information is inconsistent with other statements pt has made that have been recorded in his chart. Per H&P, pt with a history of "multiple substance abuse."  Medications reviewed and include: 1 mg folic acid, sliding scale Novolog, MVI with minerals daily, 100 mg thiamine daily, IV KCl  Labs reviewed.  NUTRITION - FOCUSED PHYSICAL EXAM:    Most Recent Value  Orbital Region  Mild depletion  Upper Arm Region  Mild depletion  Thoracic and Lumbar Region  No depletion  Buccal Region  Mild depletion  Temple Region  No depletion  Clavicle Bone Region  No depletion  Clavicle and Acromion Bone Region  No depletion  Scapular Bone Region  Unable to assess  Dorsal Hand  No depletion  Patellar Region  Moderate depletion  Anterior Thigh Region  Moderate depletion  Posterior Calf Region  Moderate depletion  Edema (RD Assessment)  None  Hair  Reviewed  Eyes  Reviewed  Mouth  Reviewed  Skin  Reviewed  Nails  Reviewed  Diet Order:   Diet Order            Diet heart healthy/carb modified Room service appropriate? Yes; Fluid consistency: Thin  Diet effective now              EDUCATION NEEDS:   Not appropriate for education at this time  Skin:  Skin Assessment: Reviewed RN Assessment (no data entered)  Last BM:  unknown/PTA  Height:   Ht Readings from Last 1 Encounters:  07/03/18 6\' 1"  (1.854 m)    Weight:   Wt Readings from Last 1 Encounters:  07/03/18 74.6 kg    Ideal Body Weight:  83.64  kg  BMI:  Body mass index is 21.7 kg/m.  Estimated Nutritional Needs:   Kcal:  2000-2200 kcal  Protein:  100-115 grams  Fluid:  2.0-2.2 L    Earma ReadingKate Jablonski Doniqua Saxby, MS, RD, LDN Pager: [phone] Weekend/After Hours: [phone]

## 2018-07-03 NOTE — Telephone Encounter (Signed)
New Message:      Pt is currently calling us from the hosp in ICU he states he is having horrible chest pain
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
## 2018-07-03 NOTE — Telephone Encounter (Signed)
Spoke with patient who is currently in hospital in the ICU. He is complaining of chest pain. Advised that patient contact his nurse who will assess him, administer any meds if needed, consult his doctor if needed. While on the phone, the phlebotomist and nurse tech were also in the room. No further assistance needed

## 2018-07-03 NOTE — Progress Notes (Signed)
ANTICOAGULATION CONSULT NOTE - Initial Consult  Pharmacy Consult for heparin Indication: chest pain/ACS  Allergies  Allergen Reactions  . Contrast Media [Iodinated Diagnostic Agents] Anaphylaxis and Swelling  . Ioxaglate Anaphylaxis and Swelling  . Metrizamide Anaphylaxis and Swelling  . Other Anaphylaxis, Shortness Of Breath and Swelling    Iv dye  . Codeine Itching    Other reaction(s): Other (See Comments) restless  . Iohexol Other (See Comments)  . Latex   . Lisinopril   . Tuberculin     Other reaction(s): Other (See Comments) Severe skin reaction  . Tuberculin Tests Other (See Comments)    Severe skin reaction  . Valsartan   . Zolpidem Other (See Comments)    confusion    Patient Measurements: Height: 6\' 1"  (185.4 cm) Weight: 164 lb 7.4 oz (74.6 kg) IBW/kg (Calculated) : 79.9 Heparin Dosing Weight: 74 kg  Vital Signs: Temp: 97.6 F (36.4 C) (08/09 1151) Temp Source: Axillary (08/09 1151)  Labs: No results for input(s): HGB, HCT, PLT, APTT, LABPROT, INR, HEPARINUNFRC, HEPRLOWMOCWT, CREATININE, CKTOTAL, CKMB, TROPONINI in the last 72 hours.  CrCl cannot be calculated (Patient's most recent lab result is older than the maximum 21 days allowed.).  Assessment: CC/HPI: 62 yo m presenting from [place] for CP eval; also with possible etoh withdrawal  PMH: DM2, HTN, Hx etoh abuse  Goal of Therapy:  Heparin level 0.3-0.7 units/ml Monitor platelets by anticoagulation protocol: Yes   Plan:  Heparin bolus 4000 units x 1 Heparin gtt 900 units/hr Initial lvl 2100 Daily HL CBC F/U cards plan  Isaac BlissMichael Amey Hossain, PharmD, BCPS, BCCCP Clinical Pharmacist [phone]  Please check AMION for all [facility] numbers  07/03/2018 12:58 PM

## 2018-07-03 NOTE — Plan of Care (Signed)

## 2018-07-03 NOTE — Plan of Care (Deleted)
Brief Nutrition Education Note  RD consulted for nutrition education regarding diabetes.  Attempted to speak with pt x 2. Pt was asleep on first attempt and awake on second attempt but continued to fall asleep during conversation with RD. Pt with brief one-word answered to RD questions.  Pt is not appropriate for more comprehensive diabetes education at this time.  Provided pt with "Carbohydrate Counting for People with Diabetes" handout from the Academy of Nutrition and Dietetics. Let pt and family member know to contact RN if any questions arise so that RD can return at a later date.  Earma ReadingKate Jablonski Doris Mcgilvery, MS, RD, LDN Pager: [phone] Weekend/After Hours: [phone]

## 2018-07-03 NOTE — Progress Notes (Addendum)
Inpatient Diabetes Program Recommendations  AACE/ADA: New Consensus Statement on Inpatient Glycemic Control (2015)  Target Ranges:  Prepandial:   less than 140 mg/dL      Peak postprandial:   less than 180 mg/dL (1-2 hours)      Critically ill patients:  140 - 180 mg/dL   Lab Results  Component Value Date   GLUCAP 150 (H) 07/03/2018   HGBA1C 6.0 (H) 11/27/2016    Review of Glycemic Control  Diabetes history: Type 2 Outpatient Diabetes medications: Janumet 50-1000 mg BID Current orders for Inpatient glycemic control: Novolog 0-15 units TID & HS  Inpatient Diabetes Program Recommendations:    Received diabetes coordinator consult. Patient was started on Novolog 0-15 units correction scale TID & HS in March, 2019 as an inpatient. Only blood sugar available  is 150 mg/dl. Agree with current order of Novolog MODERATE correction scale TID. Will continue to monitor blood sugars while in the hospital.  Smith MinceKendra Shavona Gunderman RN BSN CDE Diabetes Coordinator Pager: [phone]  8am-5pm

## 2018-07-04 DIAGNOSIS — E440 Moderate protein-calorie malnutrition: Secondary | ICD-10-CM | POA: Diagnosis not present

## 2018-07-04 DIAGNOSIS — F19988 Other psychoactive substance use, unspecified with other psychoactive substance-induced disorder: Secondary | ICD-10-CM | POA: Diagnosis not present

## 2018-07-04 DIAGNOSIS — F10232 Alcohol dependence with withdrawal with perceptual disturbance: Secondary | ICD-10-CM | POA: Diagnosis not present

## 2018-07-04 DIAGNOSIS — F10231 Alcohol dependence with withdrawal delirium: Principal | ICD-10-CM

## 2018-07-04 DIAGNOSIS — F3160 Bipolar disorder, current episode mixed, unspecified: Secondary | ICD-10-CM | POA: Diagnosis not present

## 2018-07-04 DIAGNOSIS — F1120 Opioid dependence, uncomplicated: Secondary | ICD-10-CM | POA: Diagnosis not present

## 2018-07-04 LAB — CBC
HCT: 42.3 % (ref 39.0–52.0)
Hemoglobin: 14.6 g/dL (ref 13.0–17.0)
MCH: 31.6 pg (ref 26.0–34.0)
MCHC: 34.5 g/dL (ref 30.0–36.0)
MCV: 91.6 fL (ref 78.0–100.0)
PLATELETS: 189 10*3/uL (ref 150–400)
RBC: 4.62 MIL/uL (ref 4.22–5.81)
RDW: 12.3 % (ref 11.5–15.5)
WBC: 5.4 10*3/uL (ref 4.0–10.5)

## 2018-07-04 LAB — BASIC METABOLIC PANEL
Anion gap: 12 (ref 5–15)
BUN: 8 mg/dL (ref 8–23)
CALCIUM: 8.5 mg/dL — AB (ref 8.9–10.3)
CO2: 17 mmol/L — AB (ref 22–32)
CREATININE: 0.79 mg/dL (ref 0.61–1.24)
Chloride: 106 mmol/L (ref 98–111)
GFR calc Af Amer: >60 mL/min (ref >60)
Glucose, Bld: 167 mg/dL — ABNORMAL HIGH (ref 70–99)
Potassium: 3.5 mmol/L (ref 3.5–5.1)
Sodium: 135 mmol/L (ref 135–145)

## 2018-07-04 LAB — URINALYSIS, ROUTINE W REFLEX MICROSCOPIC
Bilirubin Urine: NEGATIVE
GLUCOSE, UA: NEGATIVE mg/dL
KETONES UR: NEGATIVE mg/dL
Nitrite: POSITIVE — AB
PH: 6 (ref 5.0–8.0)
PROTEIN: NEGATIVE mg/dL
Specific Gravity, Urine: 1.017 (ref 1.005–1.030)

## 2018-07-04 LAB — MAGNESIUM: MAGNESIUM: 1.8 mg/dL (ref 1.7–2.4)

## 2018-07-04 LAB — GLUCOSE, CAPILLARY: GLUCOSE-CAPILLARY: 154 mg/dL — AB (ref 70–99)

## 2018-07-04 LAB — RAPID URINE DRUG SCREEN, HOSP PERFORMED
Amphetamines: NOT DETECTED
BARBITURATES: POSITIVE — AB
BENZODIAZEPINES: POSITIVE — AB
Cocaine: NOT DETECTED
Opiates: POSITIVE — AB
Tetrahydrocannabinol: NOT DETECTED

## 2018-07-04 LAB — HEPARIN LEVEL (UNFRACTIONATED): HEPARIN UNFRACTIONATED: 0.23 [IU]/mL — AB (ref 0.30–0.70)

## 2018-07-04 LAB — PHOSPHORUS: Phosphorus: 4.9 mg/dL — ABNORMAL HIGH (ref 2.5–4.6)

## 2018-07-04 LAB — TROPONIN I: Troponin I: <0.03 ng/mL (ref <0.03)

## 2018-07-04 MED ORDER — ASPIRIN 81 MG PO TBEC: 81.0000 mg | DELAYED_RELEASE_TABLET | Freq: Every day | ORAL | 0 refills | Status: AC

## 2018-07-04 MED ORDER — NICOTINE 14 MG/24HR TD PT24: 14.0000 mg | MEDICATED_PATCH | Freq: Every day | TRANSDERMAL | Status: DC
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
## 2018-07-04 NOTE — Progress Notes (Signed)
Pt requested to leave AMA, after speaking with Dartha Lodgegbata, MD pt signed AMA form. Both PIV's removed Pt given his belongings & reports his ride is on the way Pt escorted off the unit & directed towards hospital entrance

## 2018-07-04 NOTE — Progress Notes (Signed)
PULMONARY / CRITICAL CARE MEDICINE   Name: Raymond Melton MRN: [id number] DOB: 1956-05-30    ADMISSION DATE:  07/03/2018 CONSULTATION DATE:  8/9  REFERRING MD:  Willette Pa   CHIEF COMPLAINT:  Alcohol withdrawal/delirium tremens  HISTORY OF PRESENT ILLNESS:   62 year old male patient with a prior history of coronary artery disease, prior posttraumatic stress syndrome, hypertension, alcohol abuse, multiple falls.  Most recent cardiac cath February 05, 2018.  Underwent PCI.  Initially presented to [facility] on 8/8 with chief complaint of chest pain, he reported the pain to be intermittent.  Typically resolves spontaneously.  He had been having multiple falls at home.  Denied any head trauma.  Denied any shortness of breath.  There was concern about new T wave inversion in V2, 1, and aVL he was therefore transferred to [facility] for cardiac evaluation.  Early in the a.m. hours on 8/9 prior to transfer to [facility] he began to become visibly tremulous, he was agitated, restless, and aggressive towards staff.  He reported his last drink was the day prior.  He was given several doses of benzodiazepines as well as a one-time dose of phenobarbital.He was initially to be admitted to stepdown unit, however his need for frequent PRN's this is sedated transferred to intensive care and consideration for Precedex protocol.    SUBJECTIVE:  Denies alcohol abuse.  Wants to go home. VITAL SIGNS: BP (!) 94/58 (BP Location: Left Arm)   Pulse (!) 57   Temp 98 F (36.7 C) (Axillary)   Resp 13   Ht 6\' 1"  (1.854 m)   Wt 75.2 kg   SpO2 90%   BMI 21.87 kg/m   HEMODYNAMICS:    VENTILATOR SETTINGS:    INTAKE / OUTPUT: I/O last 3 completed shifts: In: 1192.5 [P.O.:120; I.V.:531.8; IV Piggyback:540.8] Out: 400 [Urine:400]  PHYSICAL EXAMINATION: General: Awake alert follows commands no acute distress HEENT: JVD, no lymphadenopathy Neuro: Awake alert orientated to time person and place CV:  Heart sounds regular regular rate and rhythm.  Currently on heparin drip for chest pain PULM: Decreased breath sounds in the bases ON:GEXB, non-tender, bsx4 active  Extremities: warm/dry, - edema  Skin: no rashes or lesions    LABS:  BMET Recent Labs  Lab 07/03/18 1326 07/04/18 0131  NA 138 135  K 3.4* 3.5  CL 103 106  CO2 16* 17*  BUN 11 8  CREATININE 1.09 0.79  GLUCOSE 111* 167*    Electrolytes Recent Labs  Lab 07/03/18 1326 07/04/18 0131  CALCIUM 9.8 8.5*  MG 1.7 1.8  PHOS 2.1* 4.9*    CBC Recent Labs  Lab 07/03/18 1326 07/04/18 0131  WBC 7.1 5.4  HGB 16.9 14.6  HCT 47.6 42.3  PLT 218 189    Coag's Recent Labs  Lab 07/03/18 1326  APTT 25  INR 0.96    Sepsis Markers Recent Labs  Lab 07/03/18 1909  LATICACIDVEN 1.4    ABG No results for input(s): PHART, PCO2ART, PO2ART in the last 168 hours.  Liver Enzymes Recent Labs  Lab 07/03/18 1326  AST 26  ALT 23  ALKPHOS 148*  BILITOT 1.9*  ALBUMIN 4.4    Cardiac Enzymes Recent Labs  Lab 07/03/18 1326 07/03/18 1909 07/04/18 0131  TROPONINI <0.03 <0.03 <0.03    Glucose Recent Labs  Lab 07/03/18 1148 07/03/18 1754 07/03/18 2244 07/04/18 0747  GLUCAP 150* 181* 131* 154*     STUDIES:    CULTURES:   ANTIBIOTICS:  SIGNIFICANT EVENTS: 08/2018 appears  awake alert wants to leave AMA  ASSESSMENT / PLAN:   EtOH withdrawal with DT's - [facility].  Started off calm and appropriate but later became agitated and aggressive.  He received multiple doses of ativan, valium, phenobarb [facility]  Initially admitted to SDU but later transferred to ICU for initiation of precedex given ongoing hallucinations and agitation. Plan:  Wean Precedex off Vitamin therapy Alcohol counseling UDS positive for barbiturates benzodiazepines and opiates     Chest pain - has hx of coronary occlusion with PCI in March 2019 (Dr. Gery PrayBarry).  TWI's noted  V1, V2, aVL. Plan:  Currently on heparin drip Trending troponin currently less than 0.03 No longer having chest pain on 07/04/2018 I would either call cardiology consult or discontinue heparin at this time this will be the decision per triad.   Hx HTN, HLD. Plan:  Blood pressure monitoring Intensivist as needed   Anion gap metabolic acidosis.  Resolved 18 2019 Not hypotensive. Not sure about etiology.  Doubt sepsis. No evidence of seizure.  Plan Salicylate level is normal Lactic acid level 1.4   Fluid and electrolyte imbalance: hypokalemia, hypophosphatemia resolved Recent Labs  Lab 07/03/18 1326 07/04/18 0131  K 3.4* 3.5  ' Recent Labs  Lab 07/03/18 1326 07/04/18 0131  NA 138 135    Plan 07/04/2018 currently corrected  Hx DM. CBG (last 3)  Recent Labs    07/03/18 1754 07/03/18 2244 07/04/18 0747  GLUCAP 181* 131* 154*    Plan:  Sliding-scale insulin Resume home medication   Hx bipolar affective disorder, PTSD, chronic pain. Plan:  Wean Precedex off Continue home medications for bipolar disorder and anxiety and pain 18 2019 note he wants to leave AMA.   Brett CanalesSteve Raysha Tilmon ACNP [facility] Pager [phone] till 1 pm If no answer page [phone] 07/04/2018, 9:39 AM

## 2018-07-04 NOTE — Progress Notes (Addendum)
ANTICOAGULATION CONSULT NOTE - Follow Up Consult  Pharmacy Consult for heparin Indication: chest pain/ACS  Labs: Recent Labs    07/03/18 1326 07/03/18 1909 07/03/18 2232  HGB 16.9  --   --   HCT 47.6  --   --   PLT 218  --   --   APTT 25  --   --   LABPROT 12.7  --   --   INR 0.96  --   --   HEPARINUNFRC  --   --  0.36  CREATININE 1.09  --   --   TROPONINI <0.03 <0.03  --     Assessment/Plan:  62yo male therapeutic on heparin with initial dosing for CP. Will continue gtt at current rate and confirm stable with am labs.   Vernard GamblesVeronda Mattilyn Crites, PharmD, BCPS  07/04/2018,12:27 AM   Addendum: Level now subtherapeutic at 0.23.  Will increase heparin gtt by 2 units/kg/hr to 1050 units/hr and check level in 6 hours.  VB 3:14 AM

## 2018-07-17 ENCOUNTER — Encounter (HOSPITAL_COMMUNITY): Payer: Self-pay | Admitting: Emergency Medicine

## 2018-07-17 ENCOUNTER — Emergency Department (HOSPITAL_COMMUNITY)
Admission: EM | Admit: 2018-07-17 | Discharge: 2018-07-17 | Disposition: A | Discharge status: Home / Self Care | Payer: Medicare Other | Attending: Emergency Medicine | Admitting: Emergency Medicine

## 2018-07-17 ENCOUNTER — Ambulatory Visit (HOSPITAL_COMMUNITY)
Admission: RE | Admit: 2018-07-17 | Discharge: 2018-07-17 | Disposition: A | Discharge status: Home / Self Care | Payer: Medicare Other | Source: Home / Self Care | Attending: Psychiatry | Admitting: Psychiatry

## 2018-07-17 DIAGNOSIS — Z79899 Other long term (current) drug therapy: Secondary | ICD-10-CM | POA: Insufficient documentation

## 2018-07-17 DIAGNOSIS — I10 Essential (primary) hypertension: Secondary | ICD-10-CM

## 2018-07-17 DIAGNOSIS — Z91040 Latex allergy status: Secondary | ICD-10-CM | POA: Insufficient documentation

## 2018-07-17 DIAGNOSIS — Z955 Presence of coronary angioplasty implant and graft: Secondary | ICD-10-CM | POA: Diagnosis not present

## 2018-07-17 DIAGNOSIS — Z7902 Long term (current) use of antithrombotics/antiplatelets: Secondary | ICD-10-CM | POA: Insufficient documentation

## 2018-07-17 DIAGNOSIS — Z7982 Long term (current) use of aspirin: Secondary | ICD-10-CM | POA: Diagnosis not present

## 2018-07-17 DIAGNOSIS — F17210 Nicotine dependence, cigarettes, uncomplicated: Secondary | ICD-10-CM | POA: Diagnosis not present

## 2018-07-17 DIAGNOSIS — F1320 Sedative, hypnotic or anxiolytic dependence, uncomplicated: Secondary | ICD-10-CM

## 2018-07-17 DIAGNOSIS — Z7984 Long term (current) use of oral hypoglycemic drugs: Secondary | ICD-10-CM | POA: Diagnosis not present

## 2018-07-17 DIAGNOSIS — F1310 Sedative, hypnotic or anxiolytic abuse, uncomplicated: Secondary | ICD-10-CM

## 2018-07-17 DIAGNOSIS — E119 Type 2 diabetes mellitus without complications: Secondary | ICD-10-CM

## 2018-07-17 DIAGNOSIS — I2510 Atherosclerotic heart disease of native coronary artery without angina pectoris: Secondary | ICD-10-CM | POA: Diagnosis not present

## 2018-07-17 LAB — COMPREHENSIVE METABOLIC PANEL
ALBUMIN: 4.6 g/dL (ref 3.5–5.0)
ALK PHOS: 153 U/L — AB (ref 38–126)
ALT: 30 U/L (ref 0–44)
AST: 31 U/L (ref 15–41)
Anion gap: 10 (ref 5–15)
BILIRUBIN TOTAL: 1.1 mg/dL (ref 0.3–1.2)
BUN: 17 mg/dL (ref 8–23)
CALCIUM: 9.5 mg/dL (ref 8.9–10.3)
CO2: 26 mmol/L (ref 22–32)
CREATININE: 0.92 mg/dL (ref 0.61–1.24)
Chloride: 103 mmol/L (ref 98–111)
GFR calc Af Amer: >60 mL/min (ref >60)
GFR calc non Af Amer: >60 mL/min (ref >60)
GLUCOSE: 131 mg/dL — AB (ref 70–99)
Potassium: 4.2 mmol/L (ref 3.5–5.1)
Sodium: 139 mmol/L (ref 135–145)
TOTAL PROTEIN: 7.9 g/dL (ref 6.5–8.1)

## 2018-07-17 LAB — CBC
HCT: 44.1 % (ref 39.0–52.0)
Hemoglobin: 15.1 g/dL (ref 13.0–17.0)
MCH: 32.1 pg (ref 26.0–34.0)
MCHC: 34.2 g/dL (ref 30.0–36.0)
MCV: 93.8 fL (ref 78.0–100.0)
Platelets: 199 10*3/uL (ref 150–400)
RBC: 4.7 MIL/uL (ref 4.22–5.81)
RDW: 12.9 % (ref 11.5–15.5)
WBC: 6.4 10*3/uL (ref 4.0–10.5)

## 2018-07-17 LAB — RAPID URINE DRUG SCREEN, HOSP PERFORMED
Amphetamines: NOT DETECTED
Barbiturates: POSITIVE — AB
Benzodiazepines: POSITIVE — AB
Cocaine: NOT DETECTED
OPIATES: POSITIVE — AB
TETRAHYDROCANNABINOL: NOT DETECTED

## 2018-07-17 LAB — ETHANOL: Alcohol, Ethyl (B): <10 mg/dL (ref <10)

## 2018-07-17 NOTE — ED Notes (Signed)
Bed: WLPT4 Expected date:  Expected time:  Means of arrival:  Comments: 

## 2018-07-17 NOTE — Progress Notes (Signed)
Consult request has been received. CSW attempting to follow up at present time.  CSW attempted PSA, but pt had already D/C'd. Per notes, pt stated he is "only here to pick up papers" and stated he wanted to leave.  Please reconsult if future social work needs arise.  CSW signing off, as social work intervention is no longer needed.  Dorothe PeaJonathan F. Nicholle Falzon, LCSW, LCAS, CSI Clinical Social Worker Ph: [phone]

## 2018-07-17 NOTE — ED Notes (Signed)
Per chart review, [facility], but left prior to assessment being completed.  [facility] he "had to catch a [facility]" and he [place]

## 2018-07-17 NOTE — BH Assessment (Signed)
Assessment Note  Raymond Melton is an 62 y.o. male who presented at [facility] on his own seeking a drug evaluation.  Patient states that he has been married for the past thirty-nine years and his wife is telling him that if he does not get clean that she is going to end their marriage.  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  Patient states that he has only been abusing the medication for the past two years.  Patient looks and acts impaired, but states that he has not used Xanax in the past two days.  Patient denies SI/HI/Psychosis and states that he has never been hospitalized in the past for any mental health issues and he states that he is currently being treated by any outpatient providers. Patient states that he has been trying to get into a long term treatment center in [place], but they wanted to make sure that he did not need to detox first.  Patient states that he is not experiencing any withdrawal symptoms.  Patient denies any other substance use.  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  Patient states that he has no children and his wife is his main support.  He states that he started using Xanax because friends were giving it to him for his anxiety.  Patient states that he has no legal involvement himself.  He states that he is not sleeping well and states that he only sleeps 3 hours per night.  He states that his appetite is good.  He denies any history of abuse or self-mutilation  Patient presented as alert and oriented.  His memory appeared to be intact.  His judgment and impulse control are poor.  He does not appear to be responding to any internal stimuli.  He does present as being depressed with a flat affect.  His  thoughts are organized and his eye contact is good and his speech is clear.  Patient was seen by TTS and briefly by the FNP, but he left prior to the assessment process being completed.   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 Alcohol abuse   . Altered mental status 12/17/2017  . Chronic pain   . Diabetes mellitus without complication (HCC)   . Hepatitis C   . Hypertension   . Immune deficiency disorder (HCC)   . Kidney stones   . TB lung, latent     Past Surgical History:  Procedure Laterality Date  . ABDOMINAL SURGERY    . ANKLE SURGERY    . CHOLECYSTECTOMY    . CORONARY STENT INTERVENTION N/A 02/05/2018   Procedure: CORONARY STENT INTERVENTION;  Surgeon: Lyn Records, MD;  Location[facility] INVASIVE CV LAB;  Service: Cardiovascular;  Laterality: N/A;  . ELBOW SURGERY    . EYE SURGERY    . HERNIA REPAIR    . INTRAVASCULAR PRESSURE WIRE/FFR STUDY N/A 02/05/2018   Procedure: INTRAVASCULAR PRESSURE WIRE/FFR STUDY;  Surgeon: Lyn Records, MD;  Location: [facility] INVASIVE CV LAB;  Service: Cardiovascular;  Laterality: N/A;  . LEFT HEART CATH AND CORONARY ANGIOGRAPHY N/A 02/05/2018   Procedure: LEFT HEART CATH AND CORONARY  ANGIOGRAPHY;  Surgeon: Lyn Records, MD;  Location: [facility] INVASIVE CV LAB;  Service: Cardiovascular;  Laterality: N/A;  . WRIST SURGERY      Family History:  Family History  Problem Relation Age of Onset  . Depression Mother   . Hypertension Mother   . Leukemia Mother   . Stroke Father   . Hypertension Father   . Heart attack Father   . Cancer Sister   . Hypertension Sister   . Cancer Brother        bladder  . Hypertension Brother   . Heart attack Brother     Social History:  reports that he has been smoking cigarettes. He has been smoking about 0.50 packs per day. He has never used smokeless tobacco. He reports that he drinks alcohol. He reports that he does not use drugs.  Additional  Social History:  Alcohol / Drug Use Pain Medications: denies Prescriptions: abusing Xanax off the street Over the Counter: denies History of alcohol / drug use?: Yes Substance #1 Name of Substance 1: Xanax 1 - Age of First Use: Patient states that he has been abusing Xanax for the past two years 1 - Amount (size/oz): 10 mg 1 - Frequency: daily 1 - Duration: since onset 1 - Last Use / Amount: States that his last use was 10 days ago  CIWA:   COWS:    Allergies:  Allergies  Allergen Reactions  . Contrast Media [Iodinated Diagnostic Agents] Anaphylaxis and Swelling  . Ioxaglate Anaphylaxis and Swelling  . Metrizamide Anaphylaxis and Swelling  . Other Anaphylaxis, Shortness Of Breath and Swelling    Iv dye  . Codeine Itching and Other (See Comments)    Other reaction: restless  . Iohexol Other (See Comments)  . Latex Other (See Comments)    Unknown reaction  . Lisinopril Other (See Comments)    Unknown reaction  . Tuberculin Other (See Comments)    Other reaction: Severe skin reaction  . Tuberculin Tests Other (See Comments)    Severe skin reaction  . Valsartan Other (See Comments)    Unknown reaction  . Zolpidem Other (See Comments)    confusion    Home Medications:  (Not in a hospital admission)  OB/GYN Status:  No LMP for male patient.  General Assessment Data Location of Assessment: [facility] Assessment Services TTS Assessment: In system Is this a Tele or Face-to-Face Assessment?: Face-to-Face Is this an Initial Assessment or a Re-assessment for this encounter?: Initial Assessment Marital status: Married Living Arrangements: Spouse/significant other Can pt return to current living arrangement?: Yes Admission Status: Voluntary Is patient capable of signing voluntary admission?: Yes Referral Source: Other Insurance type: Herbalist)  Medical Screening Exam [facility] Walk-in ONLY) Medical Exam completed: Yes  Crisis Care Plan Living Arrangements: Spouse/significant  other Legal Guardian: Other:(self) Name of Psychiatrist: none Name of Therapist: none  Education Status Is patient currently in school?: No Is the patient employed, unemployed or receiving disability?: Receiving disability income  Risk to self with the past 6 months Suicidal Ideation: No Has patient been a risk to self within the past 6 months prior to admission? : No Suicidal Intent: No Has patient had any suicidal intent within the past 6 months prior to admission? : No Is patient at risk for suicide?: No Suicidal Plan?: No Has patient had any suicidal plan within the past 6 months prior to admission? : No Access to Means: No What has been your use of drugs/alcohol within the last 12 months?: using  sedatives daily Previous Attempts/Gestures: No How many times?: 0 Other Self Harm Risks: (none) Triggers for Past Attempts: None known Intentional Self Injurious Behavior: None Family Suicide History: No Recent stressful life event(s): (marital conflict) Persecutory voices/beliefs?: No Depression: No Depression Symptoms: Insomnia, Isolating, Loss of interest in usual pleasures Substance abuse history and/or treatment for substance abuse?: No Suicide prevention information given to non-admitted patients: Not applicable  Risk to Others within the past 6 months Homicidal Ideation: No Does patient have any lifetime risk of violence toward others beyond the six months prior to admission? : No Thoughts of Harm to Others: No Current Homicidal Intent: No Current Homicidal Plan: No Access to Homicidal Means: No Identified Victim: none History of harm to others?: No Violent Behavior Description: none Does patient have access to weapons?: (unknown) Criminal Charges Pending?: No Does patient have a court date: No Is patient on probation?: No  Psychosis Hallucinations: None noted Delusions: None noted  Mental Status Report Appearance/Hygiene: Disheveled Eye Contact: Good Motor  Activity: Unremarkable Speech: Logical/coherent Level of Consciousness: Alert Mood: Anxious Affect: Flat Anxiety Level: Moderate Thought Processes: Coherent Judgement: Impaired Orientation: Person, Place, Time, Situation Obsessive Compulsive Thoughts/Behaviors: None  Cognitive Functioning Concentration: Decreased Memory: Recent Intact, Remote Intact Is patient IDD: No Is patient DD?: No Insight: Fair Impulse Control: Fair Appetite: Good Have you had any weight changes? : No Change Sleep: Decreased Total Hours of Sleep: 3 Vegetative Symptoms: Decreased grooming  ADLScreening [facility]) Patient's cognitive ability adequate to safely complete daily activities?: Yes Patient able to express need for assistance with ADLs?: Yes Independently performs ADLs?: Yes (appropriate for developmental age)  Prior Inpatient Therapy Prior Inpatient Therapy: No  Prior Outpatient Therapy Prior Outpatient Therapy: No Does patient have an ACCT team?: No Does patient have Intensive In-House Services?  : No Does patient have [facility] services? : No Does patient have P4CC services?: No  ADL Screening (condition at time of admission) Patient's cognitive ability adequate to safely complete daily activities?: Yes Is the patient deaf or have difficulty hearing?: No Does the patient have difficulty seeing, even when wearing glasses/contacts?: No Does the patient have difficulty concentrating, remembering, or making decisions?: No Patient able to express need for assistance with ADLs?: Yes Does the patient have difficulty dressing or bathing?: No Independently performs ADLs?: Yes (appropriate for developmental age) Does the patient have difficulty walking or climbing stairs?: No Weakness of Legs: None Weakness of Arms/Hands: None  Home Assistive Devices/Equipment Home Assistive Devices/Equipment: None  Therapy Consults (therapy consults require a physician order) PT Evaluation  Needed: No OT Evalulation Needed: No SLP Evaluation Needed: No Abuse/Neglect Assessment (Assessment to be complete while patient is alone) Abuse/Neglect Assessment Can Be Completed: Yes Physical Abuse: Denies Verbal Abuse: Denies Sexual Abuse: Denies Values / Beliefs Cultural Requests During Hospitalization: None Spiritual Requests During Hospitalization: None Consults Spiritual Care Consult Needed: No Social Work Consult Needed: No Merchant navy officerAdvance Directives (For Healthcare) Does Patient Have a Medical Advance Directive?: No Would patient like information on creating a medical advance directive?: No - Patient declined    Additional Information 1:1 In Past 12 Months?: No CIRT Risk: No Elopement Risk: No Does patient have medical clearance?: No     Disposition:  Patient was seen by TTS and briefly by the Malachy Chamberakia Starkes, FNP, but he left prior to the assessment process being completed stating he was catching a [place].   Disposition Initial Assessment Completed for this Encounter: Yes Disposition of Patient: (  pt opted to leave [facility] after seeing FNP to go to a Rehab Ctr) Patient refused recommended treatment: Yes Mode of transportation if patient is discharged?: Car Patient referred to: Other (Comment)(Patient is going to rehab center in [place])  On Site Evaluation by:   Reviewed with Physician:    Arnoldo Lenis Jobeth Pangilinan 07/17/2018 1:03 PM

## 2018-07-17 NOTE — ED Triage Notes (Signed)
[facility] States that he was sent over by counselor. Wants detox from "drugs". States that he buys "pills" off the street. "mostly xanax". Requesting detox. Denies use in the past two day. Denies SI/HI.

## 2018-07-17 NOTE — ED Provider Notes (Signed)
[facility]-EMERGENCY DEPT Provider Note   CSN: [id number] Arrival date & time: 07/17/18  1336     History   Chief Complaint Chief Complaint  Patient presents with  . Detox    HPI Raymond Melton is a 62 y.o. male is here for evaluation of detox from Xanax.  He admits to taking 10-20 Xanax pills daily.  States he has a bed available at [facility] in [place] and the counselor they are advised him to get medical detox prior to arriving to their facility.  Last intake of Xanax was earlier today, cannot tell me how much he took.  Reports one beer every other day.  No other illicit drug use.  When asked regarding suicidal thoughts he states "I just do not care".  States he has had fleeting thoughts of wanting to die for the last 1 to 2 years but denies current suicidal thoughts or plan.  No homicidal thoughts or intent, auditory or visual hallucinations.  He does not offer any other physical complaints.   While in the room, counselor at [facility] in [place] called and I spoke to her.  She confirmed patient has a bed available at their facility but he needs medical detox prior to arrival.  I explained to her inpatient we did not have medical assistant detox in this ER/hospital but offered social work consults to facilitate outpatient resources.  Patient and counselor appreciative and verbalized understanding.  HPI  Past Medical History:  Diagnosis Date  . Alcohol abuse   . Altered mental status 12/17/2017  . Chronic pain   . Diabetes mellitus without complication (HCC)   . Hepatitis C   . Hypertension   . Immune deficiency disorder (HCC)   . Kidney stones   . TB lung, latent     Patient Active Problem List   Diagnosis Date Noted  . Alcohol withdrawal (HCC) 07/03/2018  . Malnutrition of moderate degree 07/03/2018  . Hyperlipidemia 02/12/2018  . 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 Chest pain with moderate risk of acute coronary syndrome 02/04/2018  . Allergic reaction to contrast media 02/04/2018  . 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 PTSD (post-traumatic stress disorder)   . Bipolar affective disorder, current episode mixed (HCC)   . Altered mental status 12/17/2017  . Opioid use disorder, severe, dependence (HCC) 11/26/2016  . History of neck surgery 11/26/2016  . Hx of hepatitis C 11/26/2016  . Hypertension 11/26/2016  . Diabetes (HCC) 11/26/2016  . MDD (major depressive disorder), recurrent severe, 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    Past Surgical History:  Procedure Laterality Date  . ABDOMINAL SURGERY    . ANKLE SURGERY    . CHOLECYSTECTOMY    . CORONARY STENT INTERVENTION N/A 02/05/2018   Procedure: CORONARY STENT INTERVENTION;  Surgeon: Lyn Records, MD;  Location: [facility] INVASIVE CV LAB;  Service: Cardiovascular;  Laterality: N/A;  . ELBOW SURGERY    . EYE SURGERY    . HERNIA REPAIR    . INTRAVASCULAR PRESSURE WIRE/FFR STUDY N/A 02/05/2018   Procedure: INTRAVASCULAR PRESSURE WIRE/FFR STUDY;  Surgeon: Lyn Records, MD;  Location: [facility] INVASIVE CV LAB;  Service: Cardiovascular;  Laterality: N/A;  . LEFT HEART CATH AND CORONARY ANGIOGRAPHY N/A 02/05/2018   Procedure: LEFT HEART CATH AND CORONARY ANGIOGRAPHY;  Surgeon: Lyn Records, MD;  Location: [facility] INVASIVE CV LAB;  Service: Cardiovascular;  Laterality: N/A;  . WRIST SURGERY          Home Medications    Prior to Admission medications   Medication Sig Start Date End Date Taking? Authorizing Provider  ALPRAZolam Prudy Feeler(XANAX) 1 MG tablet Take 1 mg by mouth at bedtime.    [provider]  aspirin EC 81 MG EC tablet Take 1 tablet (81 mg total) by mouth daily. 07/04/18 08/03/18  Berton Mountgbata, Sylvester I, MD  clopidogrel (PLAVIX) 75 MG tablet Take 1 tablet (75 mg total) by mouth daily. 03/31/18   Runell GessBerry,  Jonathan J, MD  metFORMIN (GLUCOPHAGE) 500 MG tablet Take 500 mg by mouth 2 (two) times daily with a meal.    [provider]  metoprolol succinate (TOPROL-XL) 100 MG 24 hr tablet Take 1 tablet (100 mg total) by mouth daily. Take with or immediately following a meal: For high blood pressure 12/26/17   Nwoko, Nicole KindredAgnes I, NP  nicotine (NICODERM CQ - DOSED IN MG/24 HOURS) 21 mg/24hr patch Place 1 patch (21 mg total) onto the skin daily. 02/06/18   Lanelle BalHarbrecht, Lawrence, MD  nitroGLYCERIN (NITROSTAT) 0.4 MG SL tablet Place 1 tablet (0.4 mg total) under the tongue every 5 (five) minutes x 3 doses as needed for chest pain. 02/06/18   Lanelle BalHarbrecht, Lawrence, MD  oxyCODONE (OXY IR/ROXICODONE) 5 MG immediate release tablet Take 5 mg by mouth every 4 (four) hours as needed for severe pain.    [provider]  rosuvastatin (CRESTOR) 20 MG tablet Take 1 tablet (20 mg total) by mouth daily. 02/06/18   Lanelle BalHarbrecht, Lawrence, MD  sitaGLIPtin-metformin (JANUMET) 50-1000 MG tablet Take 1 tablet by mouth 2 (two) times daily with a meal.    [provider]  Vitamin D, Ergocalciferol, (DRISDOL) 50000 units CAPS capsule Take 50,000 Units by mouth 2 (two) times a week.    [provider]    Family History Family History  Problem Relation Age of Onset  . Depression Mother   . Hypertension Mother   . Leukemia Mother   . Stroke Father   . Hypertension Father   . Heart attack Father   . Cancer Sister   . Hypertension Sister   . Cancer Brother        bladder  . Hypertension Brother   . Heart attack Brother     Social History Social History   Tobacco Use  . Smoking status: Current Some Day Smoker    Packs/day: 0.50    Types: Cigarettes  . Smokeless tobacco: Never Used  Substance Use Topics  . Alcohol use: Yes    Comment: daily  . Drug use: No     Allergies   Contrast media [iodinated diagnostic agents]; Ioxaglate; Metrizamide; Other; Codeine; Iohexol; Latex; Lisinopril;  Tuberculin; Tuberculin tests; Valsartan; and Zolpidem   Review of Systems Review of Systems  Psychiatric/Behavioral: Positive for dysphoric mood and suicidal ideas.  All other systems reviewed and are negative.    Physical Exam Updated Vital Signs BP (!) 144/88 (BP Location: Left Arm)   Pulse (!) 109   Temp 98.5 F (36.9 C) (Oral)   Resp 20   SpO2 94%   Physical Exam  Constitutional: He is oriented to person, place, and time. He appears well-developed and well-nourished. No distress.  NAD.  HENT:  Head: Normocephalic and atraumatic.  Right Ear: External ear normal.  Left Ear: External ear normal.  Nose: Nose normal.  Eyes: Conjunctivae and EOM are normal.  Neck: Normal range of  motion. Neck supple.  Cardiovascular: Normal rate, regular rhythm, normal heart sounds and intact distal pulses.  No murmur heard. Pulmonary/Chest: Effort normal and breath sounds normal.  Musculoskeletal: Normal range of motion. He exhibits no deformity.  Neurological: He is alert and oriented to person, place, and time.  Skin: Skin is warm and dry. Capillary refill takes less than 2 seconds.  Psychiatric: His affect is blunt.  No SI, HI, AVH. Withdrawn. Cooperative.   Nursing note and vitals reviewed.    ED Treatments / Results  Labs (all labs ordered are listed, but only abnormal results are displayed) Labs Reviewed  COMPREHENSIVE METABOLIC PANEL - Abnormal; Notable for the following components:      Result Value   Glucose, Bld 131 (*)    Alkaline Phosphatase 153 (*)    All other components within normal limits  RAPID URINE DRUG SCREEN, HOSP PERFORMED - Abnormal; Notable for the following components:   Opiates POSITIVE (*)    Benzodiazepines POSITIVE (*)    Barbiturates POSITIVE (*)    All other components within normal limits  ETHANOL  CBC    EKG None  Radiology No results found.  Procedures Procedures (including critical care time)  Medications Ordered in ED Medications  - No data to display   Initial Impression / Assessment and Plan / ED Course  I have reviewed the triage vital signs and the nursing notes.  Pertinent labs & imaging results that were available during my care of the patient were reviewed by me and considered in my medical decision making (see chart for details).  Clinical Course as of Jul 17 1658  Fri Jul 17, 2018  1545 Went to talk to patient regarding SW consult. Pt not found in room, per last RN documentation pt requested to leave and was allowed to leave. There was a TTS consult ordered and SW consult pending as well.    [CG]    Clinical Course User Index [CG] Liberty Handy, PA-C    62 yo here interested in medical detox for xanax abuse.  Reports fleeting suicide thoughts for almost 2 years, not active or currently.  No HI or AVH.  I spoke to counselor at [facility]) who confirms pt has a bed available there but needs detox prior to arrival.  I explained to counselor and patient we do not offer medical assisted detox therapy in this ER but offered social work consult to assist with finding outpatient facilities/hospitals that offer this.  Pt and counselor were in agreement and patient agreed to wait until SW consult. Given ongoing dysphoric mood, fleeting thoughts of SI in setting of xanax abuse I felt it was appropriate for formal TTS consult.  Pt willing to stay voluntarily in ER. Cooperative in ER.   1658: Pt walked out from ER. I was not notified of this or his attempts to leave. He had TTS and SW consults pending.   Final Clinical Impressions(s) / ED Diagnoses   Final diagnoses:  Benzodiazepine abuse [facility])    ED Discharge Orders    None       Jerrell Mylar 07/17/18 1659    Linwood Dibbles, MD 07/20/18 [id number]

## 2018-07-17 NOTE — Progress Notes (Signed)
07/17/18  1535  Patient states he is only here to pick up papers. There is no MD note nor IVC papers and patient states he's going to leave. Called charge no one really knows why he came here instead of [facility]. Patient leaving.

## 2018-07-17 NOTE — ED Notes (Addendum)
Security approached this Clinical research associatewriter about patient wanting to leave.  Pt steady on feet and ETOH was negative.  This Clinical research associatewriter confirmed with Primary RN that the patient was not SI/HI/IVC'd.  Pt stating he was waiting on paperwork from the PA.  This Clinical research associatewriter explained that he wouldn't get paperwork until he was discharged.  Pt decided to leave and shown out by [employer].

## 2018-08-06 ENCOUNTER — Ambulatory Visit: Payer: Self-pay | Admitting: Physician Assistant

## 2018-08-19 ENCOUNTER — Ambulatory Visit: Payer: Self-pay | Admitting: Cardiology

## 2018-11-11 DIAGNOSIS — R079 Chest pain, unspecified: Secondary | ICD-10-CM | POA: Diagnosis not present

## 2018-11-26 ENCOUNTER — Other Ambulatory Visit (HOSPITAL_COMMUNITY): Payer: Self-pay | Admitting: Internal Medicine

## 2018-12-08 ENCOUNTER — Telehealth: Payer: Self-pay | Admitting: Physician Assistant

## 2018-12-08 NOTE — Telephone Encounter (Signed)
Received page for after hour answering service at 5:11pm regarding chest pain, nausea, vomiting and malaise. Called him back around 5:19pm, no answer, unable to leave voice mail. I called again around 5:20pm. Same, no answer, unable to leave voice mail. Wife's contact phone number is the same as the patient's. Will attempt again in 10 min.  Ramond Dial PA Pager: [phone]

## 2019-03-01 DIAGNOSIS — R55 Syncope and collapse: Secondary | ICD-10-CM | POA: Diagnosis not present

## 2019-03-02 DIAGNOSIS — R55 Syncope and collapse: Secondary | ICD-10-CM | POA: Diagnosis not present

## 2019-03-05 ENCOUNTER — Telehealth: Payer: Self-pay | Admitting: Podiatry

## 2019-03-05 ENCOUNTER — Other Ambulatory Visit: Payer: Self-pay | Admitting: Podiatry

## 2019-03-05 DIAGNOSIS — W25XXXA Contact with sharp glass, initial encounter: Secondary | ICD-10-CM | POA: Diagnosis not present

## 2019-03-05 DIAGNOSIS — S90851A Superficial foreign body, right foot, initial encounter: Secondary | ICD-10-CM

## 2019-03-05 MED ORDER — TRAMADOL HCL 50 MG PO TABS: 50.0000 mg | ORAL_TABLET | Freq: Three times a day (TID) | ORAL | 0 refills | Status: DC | PRN

## 2019-03-05 NOTE — Progress Notes (Signed)
Pain med sent to pharmacy given recent injury.

## 2019-03-05 NOTE — Telephone Encounter (Signed)
This patient called to the office at  5:35 this AM.  He says he just got home from the hospital for having glass enter his heel.  This patient says the doctor tried three separate times to remove the glass.  This patient says the doctor went 3 inches into his foot and was unable to remove the glass.  The patient says he had his foot bandaged and was told to go home and call this office.  This patient was seen in [facility] and I told him I would have Dr.  Samuella Cota to call to check on him since Dr.  Samuella Cota works in [place].  This patient also admits he is healthy with no medical problems.  I texted Dr.  Samuella Cota about this patient at about 9:00 and sent him the information concerning this patient.     Helane Gunther DPM

## 2019-03-05 NOTE — Progress Notes (Signed)
Pre-Visit Extended Non-Face to Face Encounter. Incident to visit 03/08/2019/  Patient called stating that he was seen at [facility] ED early this a.m. states that he stepped on a piece of glass they attempted to remove it in the ED unsuccessfully.  States that there was at least 1 shard of glass removed by EMTs.  Still having glass in the right foot that is causing significant pain and inability to bear weight. Spoke with patient on the phone for 11 minutes on triage, counseling, and coordinating care.  PDMP reviewed - last received Tramadol 01/20/2019, receives Alprazolam and Tramadol consistently. Sent Rx for Tramadol for pain, patient states he was taking Tylenol and it was not helping with his pain.  ED Note reviewed.  Patient appeared to be intoxicated in the ED. F a oreign body attempted to be removed but was unsuccessful.  Unclear if tetanus was updated. Currently on Cipro Rx by ED. Radiograph reviewed: 12.63mm foreign body evident on x-ray suggestive of glass.  Plan: Foreign body appears likely too deep for office removal. Will need removal in the operating room with possible assistance of fluoroscopy. Will plan for removal next week.  Total Non-Face to Face Time: 32 minutes  Raymond Melton [facility] 03/05/2019

## 2019-03-05 NOTE — Telephone Encounter (Signed)
This patient called to the office saying his foot is bleeding and will not stop.  He says he got up this morning and blood severely saturated his bandage.  He says the blood has bled through his bandage onto his ace bandage.  He says the bleeding will not stop.  He does admit that he had a heart attack and is on a blood thinner.  Based on his history and bleeding from his foot.  I recommended he return to the ER for evaluation and bandage redressing after his site is examined.  He does state he has transportation problems.  This patient also is diabetic with severe opioid addiction according to his snapshot.  Patient is scheduled with Dr.  Samuella Cota this coming  Monday.   Helane Gunther DPM

## 2019-03-08 ENCOUNTER — Telehealth: Payer: Self-pay | Admitting: *Deleted

## 2019-03-08 ENCOUNTER — Other Ambulatory Visit: Payer: Self-pay

## 2019-03-08 ENCOUNTER — Ambulatory Visit (INDEPENDENT_AMBULATORY_CARE_PROVIDER_SITE_OTHER): Payer: Medicare Other | Admitting: Podiatry

## 2019-03-08 VITALS — Temp 97.1°F | Resp 16 | Ht 74.0 in | Wt 185.0 lb

## 2019-03-08 DIAGNOSIS — S90851A Superficial foreign body, right foot, initial encounter: Secondary | ICD-10-CM

## 2019-03-08 DIAGNOSIS — W25XXXA Contact with sharp glass, initial encounter: Secondary | ICD-10-CM

## 2019-03-08 NOTE — Telephone Encounter (Signed)
[place] for Wednesday please. Will send forms

## 2019-03-08 NOTE — Progress Notes (Signed)
Subjective:  Patient ID: Raymond Melton, Raymond Melton    DOB: 11-02-1956,  MRN: [id number]  Chief Complaint  Patient presents with  . Foreign Body    Rt foot injury (stepped on glass) x April/10; 8/10 shapr constant pain Tx: sx shoe, pian meds -pt denies V/F/Ch -w/ nausea since today    63 y.o. Raymond Melton presents with the above complaint. History above confirmed with patient.  Review of Systems: Negative except as noted in the HPI. Denies N/V/F/Ch.  Past Medical History:  Diagnosis Date  . Alcohol abuse   . Altered mental status 12/17/2017  . Chronic pain   . Diabetes mellitus without complication (HCC)   . Hepatitis C   . Hypertension   . Immune deficiency disorder (HCC)   . Kidney stones   . TB lung, latent     Current Outpatient Medications:  .  ALPRAZolam (XANAX) 1 MG tablet, Take 1 mg by mouth at bedtime., Disp: , Rfl:  .  amLODipine (NORVASC) 10 MG tablet, Take 10 mg by mouth daily., Disp: , Rfl:  .  clopidogrel (PLAVIX) 75 MG tablet, Take 1 tablet (75 mg total) by mouth daily., Disp: 90 tablet, Rfl: 3 .  isosorbide mononitrate (ISMO,MONOKET) 10 MG tablet, Take 10 mg by mouth 2 (two) times daily., Disp: , Rfl:  .  metFORMIN (GLUCOPHAGE) 500 MG tablet, Take 500 mg by mouth 2 (two) times daily with a meal., Disp: , Rfl:  .  metoprolol succinate (TOPROL-XL) 100 MG 24 hr tablet, Take 1 tablet (100 mg total) by mouth daily. Take with or immediately following a meal: For high blood pressure, Disp: , Rfl:  .  nicotine (NICODERM CQ - DOSED IN MG/24 HOURS) 21 mg/24hr patch, Place 1 patch (21 mg total) onto the skin daily., Disp: 28 patch, Rfl: 0 .  nitroGLYCERIN (NITROSTAT) 0.4 MG SL tablet, Place 1 tablet (0.4 mg total) under the tongue every 5 (five) minutes x 3 doses as needed for chest pain., Disp: 30 tablet, Rfl: 12 .  oxyCODONE (OXY IR/ROXICODONE) 5 MG immediate release tablet, Take 5 mg by mouth every 4 (four) hours as needed for severe pain., Disp: , Rfl:  .  rosuvastatin (CRESTOR) 20  MG tablet, Take 1 tablet (20 mg total) by mouth daily., Disp: 90 tablet, Rfl: 3 .  sitaGLIPtin-metformin (JANUMET) 50-1000 MG tablet, Take 1 tablet by mouth 2 (two) times daily with a meal., Disp: , Rfl:  .  tiZANidine (ZANAFLEX) 4 MG tablet, TAKE 1 AND 1/2 TABLET BY MOUTH 3 TIMES DAILY, Disp: , Rfl:  .  traMADol (ULTRAM) 50 MG tablet, Take 1 tablet (50 mg total) by mouth every 8 (eight) hours as needed for severe pain., Disp: 12 tablet, Rfl: 0 .  Vitamin D, Ergocalciferol, (DRISDOL) 50000 units CAPS capsule, Take 50,000 Units by mouth 2 (two) times a week., Disp: , Rfl:   Social History   Tobacco Use  Smoking Status Current Some Day Smoker  . Packs/day: 0.50  . Types: Cigarettes  Smokeless Tobacco Never Used    Allergies  Allergen Reactions  . Iodinated Diagnostic Agents Anaphylaxis and Swelling  . Iodine Anaphylaxis  . Ioxaglate Anaphylaxis and Swelling  . Metrizamide Anaphylaxis and Swelling  . Other Anaphylaxis, Shortness Of Breath and Swelling    Iv dye  . Codeine Itching and Other (See Comments)    Other reaction: restless  . Iohexol Other (See Comments)  . Latex Other (See Comments)    Unknown reaction  . Lisinopril Other (See Comments)  Unknown reaction  . Tuberculin Other (See Comments)    Other reaction: Severe skin reaction  . Tuberculin Tests Other (See Comments)    Severe skin reaction  . Valsartan Other (See Comments)    Unknown reaction  . Zolpidem Other (See Comments)    confusion   Objective:   Vitals:   03/08/19 1324  Resp: 16  Temp: (!) 97.1 F (36.2 C)   Body mass index is 23.75 kg/m. Constitutional Well developed. Well nourished.  Vascular Dorsalis pedis pulses palpable bilaterally. Posterior tibial pulses palpable bilaterally. Capillary refill normal to all digits.  No cyanosis or clubbing noted. Pedal hair growth normal.  Neurologic Normal speech. Oriented to person, place, and time. Epicritic sensation to light touch grossly present  bilaterally.  Dermatologic Nails well groomed and normal in appearance. Closed incision right foot plantarly without warmth, erythema, signs of infeciton. No skin lesions.  Orthopedic: POP plantar aspect of right forefoot   Radiographs: Prior XR reviewed. Assessment:   1. Foreign body in right foot, initial encounter   2. Contact with sharp glass, initial encounter    Plan:  Patient was evaluated and treated and all questions answered.  Foreign body right foot -Prior XR reviewed -Discussed proceeding with surgical removal. Patient wishes to proceed. -CAM Boot dispensed. -Patient has failed removal and wishes to proceed with surgical intervention. All risks, benefits, and alternatives discussed with patient. No guarantees given. Consent reviewed and signed by patient. -Planned procedures: right foot removal of foreign body.   Return for after surgery.

## 2019-03-08 NOTE — Telephone Encounter (Signed)
"  I was told to call you to schedule surgery for Wednesday."  What type of surgery are you having?  "I have a foreign object, glass, in my foot.  I have to have surgery to get it out."  I'll get it scheduled once I receive your paperwork.  Someone from the surgical center will call you tomorrow and they will give you your arrival time.    (Please send paperwork as soon as possible.)

## 2019-03-08 NOTE — Progress Notes (Signed)
   Subjective:    Patient ID: Raymond Melton, male    DOB: [birth year], 63 y.o.   MRN: [id number]  HPI    Review of Systems  Musculoskeletal: Positive for arthralgias and myalgias.  All other systems reviewed and are negative.      Objective:   Physical Exam        Assessment & Plan:

## 2019-03-08 NOTE — Patient Instructions (Signed)
Pre-Operative Instructions  Congratulations, you have decided to take an important step towards improving your quality of life.  You can be assured that the doctors and staff at [facility] will be with you every step of the way.  Here are some important things you should know:  1. Plan to be at the surgery center/hospital at least 1 (one) hour prior to your scheduled time, unless otherwise directed by the surgical center/hospital staff.  You must have a responsible adult accompany you, remain during the surgery and drive you home.  Make sure you have directions to the surgical center/hospital to ensure you arrive on time. 2. If you are having surgery at [facility] or [facility], you will need a copy of your medical history and physical form from your family physician within one month prior to the date of surgery. We will give you a form for your primary physician to complete.  3. We make every effort to accommodate the date you request for surgery.  However, there are times where surgery dates or times have to be moved.  We will contact you as soon as possible if a change in schedule is required.   4. No aspirin/ibuprofen for one week before surgery.  If you are on aspirin, any non-steroidal anti-inflammatory medications (Mobic, Aleve, Ibuprofen) should not be taken seven (7) days prior to your surgery.  You make take Tylenol for pain prior to surgery.  5. Medications - If you are taking daily heart and blood pressure medications, seizure, reflux, allergy, asthma, anxiety, pain or diabetes medications, make sure you notify the surgery center/hospital before the day of surgery so they can tell you which medications you should take or avoid the day of surgery. 6. No food or drink after midnight the night before surgery unless directed otherwise by surgical center/hospital staff. 7. No alcoholic beverages 24-hours prior to surgery.  No smoking 24-hours prior or 24-hours after  surgery. 8. Wear loose pants or shorts. They should be loose enough to fit over bandages, boots, and casts. 9. Don't wear slip-on shoes. Sneakers are preferred. 10. Bring your boot with you to the surgery center/hospital.  Also bring crutches or a walker if your physician has prescribed it for you.  If you do not have this equipment, it will be provided for you after surgery. 11. If you have not been contacted by the surgery center/hospital by the day before your surgery, call to confirm the date and time of your surgery. 12. Leave-time from work may vary depending on the type of surgery you have.  Appropriate arrangements should be made prior to surgery with your employer. 13. Prescriptions will be provided immediately following surgery by your doctor.  Fill these as soon as possible after surgery and take the medication as directed. Pain medications will not be refilled on weekends and must be approved by the doctor. 14. Remove nail polish on the operative foot and avoid getting pedicures prior to surgery. 15. Wash the night before surgery.  The night before surgery wash the foot and leg well with water and the antibacterial soap provided. Be sure to pay special attention to beneath the toenails and in between the toes.  Wash for at least three (3) minutes. Rinse thoroughly with water and dry well with a towel.  Perform this wash unless told not to do so by your physician.  Enclosed: 1 Ice pack (please put in freezer the night before surgery)   1 Hibiclens skin cleaner     Pre-op instructions  If you have any questions regarding the instructions, please do not hesitate to call our office.  [place]: [address], [place], [place] [address] -- [phone]  [place]: [address] [place], [place] [address] -- [phone]  [place]: [address]  [place], [place] [address] -- [phone]  [place]: [address], [address], [place],  [address] -- [phone]  Website:  [email or web address] 

## 2019-03-09 NOTE — Telephone Encounter (Signed)
"  So I can just take this pre-operative form to a doctor and have them fill it out correct?"  You must have a physical done and get the doctor to complete the form.  "I'll take care of getting this arranged."  "I called and made an appointment for today but they can't get me in until 2 pm.  Will this be okay?"  Yes, it should be fine.  Do they need to fax it to you?"  Yes, have them fax it to me at [phone] and to the surgery scheduler at [phone].  "Okay, you'll get it as soon as possible."  Who are you going to see?  "I'm going to see Dr. Clelia Croft, he's at the same office as Dr. Nickola Major."

## 2019-03-09 NOTE — Telephone Encounter (Signed)
[employer] Medicare: [id number] - Effective Date  11/25/2018 / Termination Date  11/25/2019  Plan Deductibles & Maximums In-Network  Individual In-Network (Service Year) Deductible Member's plan does not have a deductible Out-of-Pocket $1,303.00 MET YTD  $2,297.00  remaining  $3,600.00  Plan Amt.  CPT Code 28193 This [employer] Medicare Advantage members plan does not currently require a prior authorization for these services. If you have general questions about the prior authorization requirements, please call us at [phone] or visit [email or web address] > Clinician Resources > Advance and Admission Notification Requirements. The number above acknowledges your notification. Please write this number down for future reference. Notification is not a guarantee of coverage or payment.  Decision ID #:[id number] ---------------------------------------------------------------------------------------------------------------------------------------------------------- BCBS - Member Number: [id number] Policy Effective : 11/25/2018 - 11/24/9998      In-Network    Max Per Benefit Period Year-to-Date Remaining  CoInsurance  30%   Deductible  $1500.00 $1500.00  Out-Of-Pocket  $5900.00 $5805.49   In Network  Copay Coinsurance  Not Applicable  30% per SERVICE YEAR       _________________________________________________________________________________________________ 

## 2019-03-09 NOTE — Telephone Encounter (Signed)
I am calling you to see if you had your history and physical form completed by your primary care physician (pcp).  "I haven't had it done yet.  It's at the top of my list for today."  I was told by the [facility] surgery scheduler that they could not schedule your surgery without having the history and physical form first.  "I'll get right on this because I want this thing out of my foot.  I take it to my primary doctor, right?"  Yes, that is correct, you take it to your pcp.

## 2019-03-10 ENCOUNTER — Other Ambulatory Visit: Payer: Self-pay | Admitting: Podiatry

## 2019-03-10 ENCOUNTER — Telehealth: Payer: Self-pay | Admitting: *Deleted

## 2019-03-10 DIAGNOSIS — S90851A Superficial foreign body, right foot, initial encounter: Secondary | ICD-10-CM

## 2019-03-10 MED ORDER — CEPHALEXIN 500 MG PO CAPS: 500.0000 mg | ORAL_CAPSULE | Freq: Two times a day (BID) | ORAL | 0 refills | Status: DC

## 2019-03-10 MED ORDER — OXYCODONE-ACETAMINOPHEN 10-325 MG PO TABS: 1.0000 | ORAL_TABLET | ORAL | 0 refills | Status: DC | PRN

## 2019-03-10 NOTE — Progress Notes (Signed)
DOS: 03/10/2019 Procedure: Right foot removal of foreign body.

## 2019-03-10 NOTE — Progress Notes (Signed)
Rx sent to pharmacy for outpatient surgery at [facility]

## 2019-03-10 NOTE — Telephone Encounter (Signed)
I spoke with [facility] and informed pt is able to weight bear in a cam boot and can use a knee scooter or walker to help off load. I informed M. Juan Quam, CMA of possible need and routed [facility] contact information.

## 2019-03-10 NOTE — Telephone Encounter (Signed)
[facility] - [facility] states pt had surgery today and wants home health care.

## 2019-03-15 ENCOUNTER — Ambulatory Visit (INDEPENDENT_AMBULATORY_CARE_PROVIDER_SITE_OTHER): Payer: Medicare Other | Admitting: Podiatry

## 2019-03-15 ENCOUNTER — Encounter: Payer: Self-pay | Admitting: Podiatry

## 2019-03-15 ENCOUNTER — Other Ambulatory Visit: Payer: Self-pay

## 2019-03-15 ENCOUNTER — Other Ambulatory Visit: Payer: Self-pay | Admitting: Podiatry

## 2019-03-15 VITALS — Temp 96.6°F | Resp 16

## 2019-03-15 DIAGNOSIS — S90851D Superficial foreign body, right foot, subsequent encounter: Secondary | ICD-10-CM

## 2019-03-15 DIAGNOSIS — Z9889 Other specified postprocedural states: Secondary | ICD-10-CM

## 2019-03-15 MED ORDER — CIPROFLOXACIN HCL 500 MG PO TABS: 500.0000 mg | ORAL_TABLET | Freq: Two times a day (BID) | ORAL | 0 refills | Status: DC

## 2019-03-15 MED ORDER — OXYCODONE-ACETAMINOPHEN 5-325 MG PO TABS: 1.0000 | ORAL_TABLET | ORAL | 0 refills | Status: DC | PRN

## 2019-03-15 NOTE — Progress Notes (Signed)
Subjective:  Patient ID: Raymond Melton, male    DOB: February 05, 1956,  MRN: [id number]  Chief Complaint  Patient presents with  . Routine Post Op    POV#1 DOS 03/10/2019 REMOVAL FOREIGN BODY Pt. states," it hurts all the time; 6/10 sharp constatn pain." Tx: icing, elevation and sx shoe -pt deneis N/V/F/Ch     DOS: 03/10/2019 Procedure: Removal of foreign body right foot  63 y.o. male returns for post-op check. States he didn't get the rx from his pharmacy.  Review of Systems: Negative except as noted in the HPI. Denies N/V/F/Ch.  Past Medical History:  Diagnosis Date  . Alcohol abuse   . Altered mental status 12/17/2017  . Chronic pain   . Diabetes mellitus without complication (HCC)   . Hepatitis C   . Hypertension   . Immune deficiency disorder (HCC)   . Kidney stones   . TB lung, latent     Current Outpatient Medications:  .  ALPRAZolam (XANAX) 1 MG tablet, Take 1 mg by mouth at bedtime., Disp: , Rfl:  .  amLODipine (NORVASC) 10 MG tablet, Take 10 mg by mouth daily., Disp: , Rfl:  .  cephALEXin (KEFLEX) 500 MG capsule, Take 1 capsule (500 mg total) by mouth 2 (two) times daily., Disp: 14 capsule, Rfl: 0 .  ciprofloxacin (CIPRO) 500 MG tablet, Take 500 mg by mouth 2 (two) times daily., Disp: , Rfl:  .  clopidogrel (PLAVIX) 75 MG tablet, Take 1 tablet (75 mg total) by mouth daily., Disp: 90 tablet, Rfl: 3 .  isosorbide mononitrate (ISMO,MONOKET) 10 MG tablet, Take 10 mg by mouth 2 (two) times daily., Disp: , Rfl:  .  meclizine (ANTIVERT) 25 MG tablet, TAKE ONE TABLET BY MOUTH EVERY DAY AS NEEDED for dizziness, Disp: , Rfl:  .  metFORMIN (GLUCOPHAGE) 500 MG tablet, Take 500 mg by mouth 2 (two) times daily with a meal., Disp: , Rfl:  .  metoprolol succinate (TOPROL-XL) 100 MG 24 hr tablet, Take 1 tablet (100 mg total) by mouth daily. Take with or immediately following a meal: For high blood pressure, Disp: , Rfl:  .  nicotine (NICODERM CQ - DOSED IN MG/24 HOURS) 21 mg/24hr patch,  Place 1 patch (21 mg total) onto the skin daily., Disp: 28 patch, Rfl: 0 .  nitroGLYCERIN (NITROSTAT) 0.4 MG SL tablet, Place 1 tablet (0.4 mg total) under the tongue every 5 (five) minutes x 3 doses as needed for chest pain., Disp: 30 tablet, Rfl: 12 .  oxyCODONE (OXY IR/ROXICODONE) 5 MG immediate release tablet, Take 5 mg by mouth every 4 (four) hours as needed for severe pain., Disp: , Rfl:  .  oxyCODONE-acetaminophen (PERCOCET) 10-325 MG tablet, Take 1 tablet by mouth every 4 (four) hours as needed for pain., Disp: 20 tablet, Rfl: 0 .  promethazine (PHENERGAN) 25 MG tablet, Take 25 mg by mouth every 12 (twelve) hours as needed., Disp: , Rfl:  .  rosuvastatin (CRESTOR) 20 MG tablet, Take 1 tablet (20 mg total) by mouth daily., Disp: 90 tablet, Rfl: 3 .  sitaGLIPtin-metformin (JANUMET) 50-1000 MG tablet, Take 1 tablet by mouth 2 (two) times daily with a meal., Disp: , Rfl:  .  tiZANidine (ZANAFLEX) 4 MG tablet, TAKE 1 AND 1/2 TABLET BY MOUTH 3 TIMES DAILY, Disp: , Rfl:  .  traMADol (ULTRAM) 50 MG tablet, Take 1 tablet (50 mg total) by mouth every 8 (eight) hours as needed for severe pain., Disp: 12 tablet, Rfl: 0 .  Vitamin  D, Ergocalciferol, (DRISDOL) 50000 units CAPS capsule, Take 50,000 Units by mouth 2 (two) times a week., Disp: , Rfl:   Social History   Tobacco Use  Smoking Status Current Some Day Smoker  . Packs/day: 0.50  . Types: Cigarettes  Smokeless Tobacco Never Used    Allergies  Allergen Reactions  . Iodinated Diagnostic Agents Anaphylaxis and Swelling  . Iodine Anaphylaxis  . Ioxaglate Anaphylaxis and Swelling  . Metrizamide Anaphylaxis and Swelling  . Other Anaphylaxis, Shortness Of Breath and Swelling    Iv dye  . Codeine Itching and Other (See Comments)    Other reaction: restless  . Iohexol Other (See Comments)  . Latex Other (See Comments)    Unknown reaction  . Lisinopril Other (See Comments)    Unknown reaction  . Tuberculin Other (See Comments)    Other  reaction: Severe skin reaction  . Tuberculin Tests Other (See Comments)    Severe skin reaction  . Valsartan Other (See Comments)    Unknown reaction  . Zolpidem Other (See Comments)    confusion   Objective:   Vitals:   03/15/19 0919  Resp: 16  Temp: (!) 96.6 F (35.9 C)   There is no height or weight on file to calculate BMI. Constitutional Well developed. Well nourished.  Vascular Foot warm and well perfused. Capillary refill normal to all digits.   Neurologic Normal speech. Oriented to person, place, and time. Epicritic sensation to light touch grossly present bilaterally.  Dermatologic Skin healing well without signs of infection. Skin edges well coapted without signs of infection.  Orthopedic: Tenderness to palpation noted about the surgical site.   Radiographs: None Assessment:   1. Foreign body in right foot, subsequent encounter   2. Post-operative state    Plan:  Patient was evaluated and treated and all questions answered.  S/p foot surgery right -Progressing as expected post-operatively. -XR: None today -WB Status: WBAT in surgical shoe -Sutures: Staples intact. -Medications: Start Abx  -Foot redressed.  Return in about 3 weeks (around 04/05/2019) for Staple removal .

## 2019-03-15 NOTE — Progress Notes (Signed)
Rx re-submitted to [facility]

## 2019-03-23 ENCOUNTER — Ambulatory Visit (INDEPENDENT_AMBULATORY_CARE_PROVIDER_SITE_OTHER): Payer: Medicare Other | Admitting: Podiatry

## 2019-03-23 ENCOUNTER — Encounter: Payer: Self-pay | Admitting: Podiatry

## 2019-03-23 ENCOUNTER — Other Ambulatory Visit: Payer: Self-pay

## 2019-03-23 ENCOUNTER — Encounter: Payer: Medicare Other | Admitting: Podiatry

## 2019-03-23 VITALS — Temp 98.0°F | Resp 16

## 2019-03-23 DIAGNOSIS — Z9889 Other specified postprocedural states: Secondary | ICD-10-CM

## 2019-03-23 NOTE — Progress Notes (Signed)
Subjective:  Patient ID: Raymond Melton, male    DOB: Sep 25, 1956,  MRN: [id number]  Chief Complaint  Patient presents with  . Routine Post Op    DOS 03/10/2019 REMOVAL FOREIGN BODY Pt. states," doing great. There's no pain just a little burning; 2/10." tx: sx shoe and ace wrap -pt denies N/V/F/Ch     DOS: 03/10/2019 Procedure: Removal of foreign body right foot  63 y.o. male returns for post-op check. Does not have any pain states the area is doing much better.  Review of Systems: Negative except as noted in the HPI. Denies N/V/F/Ch.  Past Medical History:  Diagnosis Date  . Alcohol abuse   . Altered mental status 12/17/2017  . Chronic pain   . Diabetes mellitus without complication (HCC)   . Hepatitis C   . Hypertension   . Immune deficiency disorder (HCC)   . Kidney stones   . TB lung, latent     Current Outpatient Medications:  .  ALPRAZolam (XANAX) 1 MG tablet, Take 1 mg by mouth at bedtime., Disp: , Rfl:  .  amLODipine (NORVASC) 10 MG tablet, Take 10 mg by mouth daily., Disp: , Rfl:  .  cephALEXin (KEFLEX) 500 MG capsule, Take 1 capsule (500 mg total) by mouth 2 (two) times daily., Disp: 14 capsule, Rfl: 0 .  ciprofloxacin (CIPRO) 500 MG tablet, Take 1 tablet (500 mg total) by mouth 2 (two) times daily., Disp: 14 tablet, Rfl: 0 .  clopidogrel (PLAVIX) 75 MG tablet, Take 1 tablet (75 mg total) by mouth daily., Disp: 90 tablet, Rfl: 3 .  isosorbide mononitrate (ISMO,MONOKET) 10 MG tablet, Take 10 mg by mouth 2 (two) times daily., Disp: , Rfl:  .  meclizine (ANTIVERT) 25 MG tablet, TAKE ONE TABLET BY MOUTH EVERY DAY AS NEEDED for dizziness, Disp: , Rfl:  .  metFORMIN (GLUCOPHAGE) 500 MG tablet, Take 500 mg by mouth 2 (two) times daily with a meal., Disp: , Rfl:  .  metoprolol succinate (TOPROL-XL) 100 MG 24 hr tablet, Take 1 tablet (100 mg total) by mouth daily. Take with or immediately following a meal: For high blood pressure, Disp: , Rfl:  .  nicotine (NICODERM CQ -  DOSED IN MG/24 HOURS) 21 mg/24hr patch, Place 1 patch (21 mg total) onto the skin daily., Disp: 28 patch, Rfl: 0 .  nitroGLYCERIN (NITROSTAT) 0.4 MG SL tablet, Place 1 tablet (0.4 mg total) under the tongue every 5 (five) minutes x 3 doses as needed for chest pain., Disp: 30 tablet, Rfl: 12 .  oxyCODONE (OXY IR/ROXICODONE) 5 MG immediate release tablet, Take 5 mg by mouth every 4 (four) hours as needed for severe pain., Disp: , Rfl:  .  oxyCODONE-acetaminophen (PERCOCET) 5-325 MG tablet, Take 1 tablet by mouth every 4 (four) hours as needed for severe pain., Disp: 8 tablet, Rfl: 0 .  promethazine (PHENERGAN) 25 MG tablet, Take 25 mg by mouth every 12 (twelve) hours as needed., Disp: , Rfl:  .  rosuvastatin (CRESTOR) 20 MG tablet, Take 1 tablet (20 mg total) by mouth daily., Disp: 90 tablet, Rfl: 3 .  sitaGLIPtin-metformin (JANUMET) 50-1000 MG tablet, Take 1 tablet by mouth 2 (two) times daily with a meal., Disp: , Rfl:  .  tiZANidine (ZANAFLEX) 4 MG tablet, TAKE 1 AND 1/2 TABLET BY MOUTH 3 TIMES DAILY, Disp: , Rfl:  .  traMADol (ULTRAM) 50 MG tablet, Take 1 tablet (50 mg total) by mouth every 8 (eight) hours as needed for severe pain.,  Disp: 12 tablet, Rfl: 0 .  Vitamin D, Ergocalciferol, (DRISDOL) 50000 units CAPS capsule, Take 50,000 Units by mouth 2 (two) times a week., Disp: , Rfl:   Social History   Tobacco Use  Smoking Status Current Some Day Smoker  . Packs/day: 0.50  . Types: Cigarettes  Smokeless Tobacco Never Used    Allergies  Allergen Reactions  . Iodinated Diagnostic Agents Anaphylaxis and Swelling  . Iodine Anaphylaxis  . Ioxaglate Anaphylaxis and Swelling  . Metrizamide Anaphylaxis and Swelling  . Other Anaphylaxis, Shortness Of Breath and Swelling    Iv dye  . Codeine Itching and Other (See Comments)    Other reaction: restless  . Iohexol Other (See Comments)  . Latex Other (See Comments)    Unknown reaction  . Lisinopril Other (See Comments)    Unknown reaction  .  Tuberculin Other (See Comments)    Other reaction: Severe skin reaction  . Tuberculin Tests Other (See Comments)    Severe skin reaction  . Valsartan Other (See Comments)    Unknown reaction  . Zolpidem Other (See Comments)    confusion   Objective:   Vitals:   03/23/19 1009  Resp: 16  Temp: 98 F (36.7 C)   There is no height or weight on file to calculate BMI. Constitutional Well developed. Well nourished.  Vascular Foot warm and well perfused. Capillary refill normal to all digits.   Neurologic Normal speech. Oriented to person, place, and time. Epicritic sensation to light touch grossly present bilaterally.  Dermatologic Skin healing well along the margins of the incision. Centrally staples intact but not ready for removal.  Orthopedic: No tenderness to palpation noted about the surgical site.   Radiographs: None Assessment:   1. Post-operative state    Plan:  Patient was evaluated and treated and all questions answered.  S/p foot surgery right -Progressing as expected post-operatively. -XR: None today -WB Status: WBAT in surgical shoe -Sutures: Half of staples removed. Central staples left intact. -Medications: Start Abx  -Foot redressed.  Return in about 1 week (around 03/30/2019).

## 2019-03-30 ENCOUNTER — Other Ambulatory Visit: Payer: Self-pay

## 2019-03-30 ENCOUNTER — Encounter: Payer: Medicare Other | Admitting: Podiatry

## 2019-03-30 ENCOUNTER — Ambulatory Visit (INDEPENDENT_AMBULATORY_CARE_PROVIDER_SITE_OTHER): Payer: Medicare Other | Admitting: Podiatry

## 2019-03-30 DIAGNOSIS — Z9889 Other specified postprocedural states: Secondary | ICD-10-CM

## 2019-04-06 ENCOUNTER — Encounter: Payer: Self-pay | Admitting: Podiatry

## 2019-04-06 ENCOUNTER — Ambulatory Visit (INDEPENDENT_AMBULATORY_CARE_PROVIDER_SITE_OTHER): Payer: Medicare Other | Admitting: Podiatry

## 2019-04-06 ENCOUNTER — Other Ambulatory Visit: Payer: Self-pay

## 2019-04-06 VITALS — BP 170/103 | HR 102 | Temp 97.7°F | Resp 16

## 2019-04-06 DIAGNOSIS — Z9889 Other specified postprocedural states: Secondary | ICD-10-CM

## 2019-04-06 NOTE — Progress Notes (Signed)
Subjective:  Patient ID: Raymond Melton, male    DOB: Apr 12, 1956,  MRN: [id number]  Chief Complaint  Patient presents with  . Routine Post Op    pov 03/10/2019 REMOVAL FOREIGN BODY -[pt states," the other day I went to the ER b/c it was bleeding a little bit and they took all the sutures out. Foot is still sore; 3/10." Tx: advil and elevation -pt deneis N/V/F/Ch    DOS: 03/10/2019 Procedure: Removal of foreign body right foot  63 y.o. male returns for post-op check. History above confirmed with patient.  Review of Systems: Negative except as noted in the HPI. Denies N/V/F/Ch.  Past Medical History:  Diagnosis Date  . Alcohol abuse   . Altered mental status 12/17/2017  . Chronic pain   . Diabetes mellitus without complication (HCC)   . Hepatitis C   . Hypertension   . Immune deficiency disorder (HCC)   . Kidney stones   . TB lung, latent     Current Outpatient Medications:  .  ALPRAZolam (XANAX) 1 MG tablet, Take 1 mg by mouth at bedtime., Disp: , Rfl:  .  amLODipine (NORVASC) 10 MG tablet, Take 10 mg by mouth daily., Disp: , Rfl:  .  cephALEXin (KEFLEX) 500 MG capsule, Take 1 capsule (500 mg total) by mouth 2 (two) times daily., Disp: 14 capsule, Rfl: 0 .  ciprofloxacin (CIPRO) 500 MG tablet, Take 1 tablet (500 mg total) by mouth 2 (two) times daily., Disp: 14 tablet, Rfl: 0 .  clopidogrel (PLAVIX) 75 MG tablet, Take 1 tablet (75 mg total) by mouth daily., Disp: 90 tablet, Rfl: 3 .  isosorbide mononitrate (ISMO,MONOKET) 10 MG tablet, Take 10 mg by mouth 2 (two) times daily., Disp: , Rfl:  .  meclizine (ANTIVERT) 25 MG tablet, TAKE ONE TABLET BY MOUTH EVERY DAY AS NEEDED for dizziness, Disp: , Rfl:  .  metFORMIN (GLUCOPHAGE) 500 MG tablet, Take 500 mg by mouth 2 (two) times daily with a meal., Disp: , Rfl:  .  metoprolol succinate (TOPROL-XL) 100 MG 24 hr tablet, Take 1 tablet (100 mg total) by mouth daily. Take with or immediately following a meal: For high blood pressure,  Disp: , Rfl:  .  nicotine (NICODERM CQ - DOSED IN MG/24 HOURS) 21 mg/24hr patch, Place 1 patch (21 mg total) onto the skin daily., Disp: 28 patch, Rfl: 0 .  nitroGLYCERIN (NITROSTAT) 0.4 MG SL tablet, Place 1 tablet (0.4 mg total) under the tongue every 5 (five) minutes x 3 doses as needed for chest pain., Disp: 30 tablet, Rfl: 12 .  oxyCODONE (OXY IR/ROXICODONE) 5 MG immediate release tablet, Take 5 mg by mouth every 4 (four) hours as needed for severe pain., Disp: , Rfl:  .  oxyCODONE-acetaminophen (PERCOCET) 5-325 MG tablet, Take 1 tablet by mouth every 4 (four) hours as needed for severe pain., Disp: 8 tablet, Rfl: 0 .  promethazine (PHENERGAN) 25 MG tablet, Take 25 mg by mouth every 12 (twelve) hours as needed., Disp: , Rfl:  .  rosuvastatin (CRESTOR) 20 MG tablet, Take 1 tablet (20 mg total) by mouth daily., Disp: 90 tablet, Rfl: 3 .  sitaGLIPtin-metformin (JANUMET) 50-1000 MG tablet, Take 1 tablet by mouth 2 (two) times daily with a meal., Disp: , Rfl:  .  tiZANidine (ZANAFLEX) 4 MG tablet, TAKE 1 AND 1/2 TABLET BY MOUTH 3 TIMES DAILY, Disp: , Rfl:  .  traMADol (ULTRAM) 50 MG tablet, Take 1 tablet (50 mg total) by mouth every 8 (  eight) hours as needed for severe pain., Disp: 12 tablet, Rfl: 0 .  Vitamin D, Ergocalciferol, (DRISDOL) 50000 units CAPS capsule, Take 50,000 Units by mouth 2 (two) times a week., Disp: , Rfl:   Social History   Tobacco Use  Smoking Status Current Some Day Smoker  . Packs/day: 0.50  . Types: Cigarettes  Smokeless Tobacco Never Used    Allergies  Allergen Reactions  . Iodinated Diagnostic Agents Anaphylaxis and Swelling  . Iodine Anaphylaxis  . Ioxaglate Anaphylaxis and Swelling  . Metrizamide Anaphylaxis and Swelling  . Other Anaphylaxis, Shortness Of Breath and Swelling    Iv dye  . Codeine Itching and Other (See Comments)    Other reaction: restless  . Iohexol Other (See Comments)  . Latex Other (See Comments)    Unknown reaction  . Lisinopril  Other (See Comments)    Unknown reaction  . Tuberculin Other (See Comments)    Other reaction: Severe skin reaction  . Tuberculin Tests Other (See Comments)    Severe skin reaction  . Valsartan Other (See Comments)    Unknown reaction  . Zolpidem Other (See Comments)    confusion   Objective:   Vitals:   04/06/19 0916  BP: (!) 170/103  Pulse: (!) 102  Resp: 16  Temp: 97.7 F (36.5 C)   There is no height or weight on file to calculate BMI. Constitutional Well developed. Well nourished.  Vascular Foot warm and well perfused. Capillary refill normal to all digits.   Neurologic Normal speech. Oriented to person, place, and time. Epicritic sensation to light touch grossly present bilaterally.  Dermatologic Skin well healed plantar foot.  Orthopedic: No tenderness to palpation noted about the surgical site.   Radiographs: None Assessment:   1. Post-operative state    Plan:  Patient was evaluated and treated and all questions answered.  S/p foot surgery right -Progressing as expected post-operatively. -XR: None today -WB Status: WBAT in normal shoegear  -Sutures: Were removed in the ED -F/u PRN.  No follow-ups on file.

## 2019-04-24 NOTE — Progress Notes (Addendum)
CC: Patient states is a little sore but able to move around on it only hurts me she is on it walks on it 3 out of 10.  Denies redness swelling drainage  Patient left without being seen. New dressing was applied by CMA staff.

## 2019-04-26 DIAGNOSIS — F17200 Nicotine dependence, unspecified, uncomplicated: Secondary | ICD-10-CM | POA: Insufficient documentation

## 2019-04-26 DIAGNOSIS — I340 Nonrheumatic mitral (valve) insufficiency: Secondary | ICD-10-CM | POA: Insufficient documentation

## 2019-05-04 ENCOUNTER — Encounter: Payer: Self-pay | Admitting: Cardiovascular Disease

## 2019-06-01 DIAGNOSIS — Z91041 Radiographic dye allergy status: Secondary | ICD-10-CM | POA: Insufficient documentation

## 2019-10-11 ENCOUNTER — Ambulatory Visit (INDEPENDENT_AMBULATORY_CARE_PROVIDER_SITE_OTHER): Payer: Medicare Other | Admitting: Podiatry

## 2019-10-11 ENCOUNTER — Other Ambulatory Visit: Payer: Self-pay

## 2019-10-11 DIAGNOSIS — E1169 Type 2 diabetes mellitus with other specified complication: Secondary | ICD-10-CM

## 2019-10-11 DIAGNOSIS — E1142 Type 2 diabetes mellitus with diabetic polyneuropathy: Secondary | ICD-10-CM | POA: Diagnosis not present

## 2019-10-11 DIAGNOSIS — L84 Corns and callosities: Secondary | ICD-10-CM | POA: Diagnosis not present

## 2019-10-11 DIAGNOSIS — B351 Tinea unguium: Secondary | ICD-10-CM

## 2019-10-27 ENCOUNTER — Encounter: Payer: Self-pay | Admitting: Cardiovascular Disease

## 2019-11-07 NOTE — Progress Notes (Signed)
Subjective:  Patient ID: Raymond Melton, male    DOB: 1956-04-19,  MRN: [id number]  Chief Complaint  Patient presents with  . Callouses    Rt plantar forefoot callus  . debride    diabetic nail trimming  . Diabetes    FBS: 289 A1C: 8 PCP: Haque x wed.     63 y.o. male presents  for diabetic foot care.  Reports a painful callus of the area the previous puncture wound Review of Systems: Negative except as noted in the HPI. Denies N/V/F/Ch.  Past Medical History:  Diagnosis Date  . Alcohol abuse   . Altered mental status 12/17/2017  . Chronic pain   . Diabetes mellitus without complication (HCC)   . Hepatitis C   . Hypertension   . Immune deficiency disorder (HCC)   . Kidney stones   . TB lung, latent     Current Outpatient Medications:  .  ALPRAZolam (XANAX) 1 MG tablet, Take 1 mg by mouth at bedtime., Disp: , Rfl:  .  amLODipine (NORVASC) 10 MG tablet, Take 10 mg by mouth daily., Disp: , Rfl:  .  cephALEXin (KEFLEX) 500 MG capsule, Take 1 capsule (500 mg total) by mouth 2 (two) times daily., Disp: 14 capsule, Rfl: 0 .  ciprofloxacin (CIPRO) 500 MG tablet, Take 1 tablet (500 mg total) by mouth 2 (two) times daily., Disp: 14 tablet, Rfl: 0 .  clopidogrel (PLAVIX) 75 MG tablet, Take 1 tablet (75 mg total) by mouth daily., Disp: 90 tablet, Rfl: 3 .  isosorbide mononitrate (ISMO,MONOKET) 10 MG tablet, Take 10 mg by mouth 2 (two) times daily., Disp: , Rfl:  .  meclizine (ANTIVERT) 25 MG tablet, TAKE ONE TABLET BY MOUTH EVERY DAY AS NEEDED for dizziness, Disp: , Rfl:  .  metFORMIN (GLUCOPHAGE) 500 MG tablet, Take 500 mg by mouth 2 (two) times daily with a meal., Disp: , Rfl:  .  metoprolol succinate (TOPROL-XL) 100 MG 24 hr tablet, Take 1 tablet (100 mg total) by mouth daily. Take with or immediately following a meal: For high blood pressure, Disp: , Rfl:  .  nicotine (NICODERM CQ - DOSED IN MG/24 HOURS) 21 mg/24hr patch, Place 1 patch (21 mg total) onto the skin daily., Disp: 28  patch, Rfl: 0 .  nitroGLYCERIN (NITROSTAT) 0.4 MG SL tablet, Place 1 tablet (0.4 mg total) under the tongue every 5 (five) minutes x 3 doses as needed for chest pain., Disp: 30 tablet, Rfl: 12 .  oxyCODONE (OXY IR/ROXICODONE) 5 MG immediate release tablet, Take 5 mg by mouth every 4 (four) hours as needed for severe pain., Disp: , Rfl:  .  oxyCODONE-acetaminophen (PERCOCET) 5-325 MG tablet, Take 1 tablet by mouth every 4 (four) hours as needed for severe pain., Disp: 8 tablet, Rfl: 0 .  promethazine (PHENERGAN) 25 MG tablet, Take 25 mg by mouth every 12 (twelve) hours as needed., Disp: , Rfl:  .  rosuvastatin (CRESTOR) 20 MG tablet, Take 1 tablet (20 mg total) by mouth daily., Disp: 90 tablet, Rfl: 3 .  sitaGLIPtin-metformin (JANUMET) 50-1000 MG tablet, Take 1 tablet by mouth 2 (two) times daily with a meal., Disp: , Rfl:  .  tiZANidine (ZANAFLEX) 4 MG tablet, TAKE 1 AND 1/2 TABLET BY MOUTH 3 TIMES DAILY, Disp: , Rfl:  .  traMADol (ULTRAM) 50 MG tablet, Take 1 tablet (50 mg total) by mouth every 8 (eight) hours as needed for severe pain., Disp: 12 tablet, Rfl: 0 .  Vitamin D, Ergocalciferol, (  DRISDOL) 50000 units CAPS capsule, Take 50,000 Units by mouth 2 (two) times a week., Disp: , Rfl:   Social History   Tobacco Use  Smoking Status Current Some Day Smoker  . Packs/day: 0.50  . Types: Cigarettes  Smokeless Tobacco Never Used    Allergies  Allergen Reactions  . Iodinated Diagnostic Agents Anaphylaxis and Swelling  . Iodine Anaphylaxis  . Ioxaglate Anaphylaxis and Swelling  . Metrizamide Anaphylaxis and Swelling  . Other Anaphylaxis, Shortness Of Breath and Swelling    Iv dye  . Codeine Itching and Other (See Comments)    Other reaction: restless  . Iohexol Other (See Comments)  . Latex Other (See Comments)    Unknown reaction  . Lisinopril Other (See Comments)    Unknown reaction  . Tuberculin Other (See Comments)    Other reaction: Severe skin reaction  . Tuberculin Tests Other  (See Comments)    Severe skin reaction  . Valsartan Other (See Comments)    Unknown reaction  . Zolpidem Other (See Comments)    confusion   Objective:  There were no vitals filed for this visit. There is no height or weight on file to calculate BMI. Constitutional Well developed. Well nourished.  Vascular Dorsalis pedis pulses present 1+ bilaterally  Posterior tibial pulses present 1+ bilaterally  Pedal hair growth diminished. Capillary refill normal to all digits.  No cyanosis or clubbing noted.  Neurologic Normal speech. Oriented to person, place, and time. Epicritic sensation to light touch grossly present bilaterally. Protective sensation with 5.07 monofilament  absent bilaterally.  Dermatologic Nails elongated, thickened, dystrophic. No open wounds. Hyperkeratosis at the area of previous puncture wound incision.  Incision well-healed  Orthopedic: Normal joint ROM without pain or crepitus bilaterally. No visible deformities. No bony tenderness.   Assessment:   1. Onychomycosis of multiple toenails with type 2 diabetes mellitus and peripheral neuropathy (HCC)   2. Callus   3. DM type 2 with diabetic peripheral neuropathy ([place])    Plan:  Patient was evaluated and treated and all questions answered.  Diabetes with diabetic peripheral neuropathy, Onychomycosis -Educated on diabetic footcare. Diabetic risk level 3 -Nails x10 debrided sharply and manually with large nail nipper and rotary burr.   Procedure: Nail Debridement Rationale: Patient meets criteria for routine foot care due to diabetic peripheral neuropathy Type of Debridement: manual, sharp debridement. Instrumentation: Nail nipper, rotary burr. Number of Nails: 10   Procedure: Paring of Lesion Rationale: painful hyperkeratotic lesion Type of Debridement: manual, sharp debridement. Instrumentation: 312 blade Number of Lesions: 1   No follow-ups on file.

## 2019-12-31 ENCOUNTER — Encounter: Payer: Self-pay | Admitting: General Practice

## 2020-01-11 ENCOUNTER — Ambulatory Visit: Payer: Medicare Other | Admitting: Podiatry

## 2020-03-21 ENCOUNTER — Telehealth: Payer: Self-pay | Admitting: Cardiovascular Disease

## 2020-03-21 NOTE — Telephone Encounter (Signed)
° °  [facility] Pre-operative Risk Assessment    Request for surgical clearance:  1. What type of surgery is being performed? L inguinal hernia repair   2. When is this surgery scheduled? TBD based on Clearance   3. What type of clearance is required (medical clearance vs. Pharmacy clearance to hold med vs. Both)? Both  4. Are there any medications that need to be held prior to surgery and how long?up to Cardilolgy  5. Practice name and name of physician performing surgery? Dr. Kendell Bane, [facility]  6. What is your office phone number: [phone]   7.   What is your office fax number: [phone]   8.   Anesthesia type (None, local, MAC, general) ? TBD  Dictation will be faxed to Dr. Gwenlyn Found as soon as Dr. Amalia Hailey finishes it. Office knows pt has an appt to see Dr. Gwenlyn Found 03-29-20  Johnna Acosta 03/21/2020, 2:09 PM  _________________________________________________________________   (provider comments below)

## 2020-03-22 NOTE — Telephone Encounter (Signed)
This patient has an appointment with Dr Allyson Sabal on 5/5- clearance to be addressed at that appointment.  Corine Shelter PA-C 03/22/2020 9:31 AM

## 2020-03-29 ENCOUNTER — Ambulatory Visit: Payer: Medicare Other | Admitting: Cardiovascular Disease

## 2020-04-05 ENCOUNTER — Encounter: Payer: Self-pay | Admitting: *Deleted

## 2020-04-05 ENCOUNTER — Ambulatory Visit (INDEPENDENT_AMBULATORY_CARE_PROVIDER_SITE_OTHER): Payer: Medicare Other | Admitting: Cardiovascular Disease

## 2020-04-05 ENCOUNTER — Encounter: Payer: Self-pay | Admitting: Cardiovascular Disease

## 2020-04-05 ENCOUNTER — Other Ambulatory Visit: Payer: Self-pay

## 2020-04-05 VITALS — BP 124/62 | HR 61 | Ht 73.0 in | Wt 191.0 lb

## 2020-04-05 DIAGNOSIS — I10 Essential (primary) hypertension: Secondary | ICD-10-CM

## 2020-04-05 DIAGNOSIS — E782 Mixed hyperlipidemia: Secondary | ICD-10-CM | POA: Diagnosis not present

## 2020-04-05 DIAGNOSIS — I2510 Atherosclerotic heart disease of native coronary artery without angina pectoris: Secondary | ICD-10-CM | POA: Diagnosis not present

## 2020-04-05 NOTE — Assessment & Plan Note (Signed)
History of CAD status post circumflex obtuse marginal branch stenting by Dr. Katrinka Blazing 02/05/2018 with scattered noncritical disease on other places and normal LV function.  He was on aspirin and Brilinta.  I transitioned him to Plavix.  He apparently was in [facility] last year and had a diagnostic catheter did not show disease that warranted intervention.  Does get chest pain on a weekly basis.  He apparently is here for preop clearance before elective hernia repair.  I am going to get a pharmacologic Myoview stress test and 2D echo to further evaluate

## 2020-04-05 NOTE — Progress Notes (Signed)
04/05/2020 Raymond Melton   09-15-1956  [id number]  Primary Physician Patient, No Pcp Per Primary Cardiologist: Raymond Harp MD Raymond Melton, Raymond Melton, [place]  HPI:  Raymond Melton is a 64 y.o.    mildly overweight separated Caucasian male with no children who currently does not work.I last saw him in the office  05/19/2018. He has a long history of tobacco abuse smoking one half pack a day until recently when he dramatically reduce this. He does have a history of treated hypertension and diabetes. There is a strong family history of heart disease. He's never had a heart attack or stroke. He was admitted with unstable angina and underwent diagnostic coronary angiography by Dr. Tamala Julian on 02/05/18 revealed a high-grade obtuse marginal branch stenosis which was successfully stented using a synergy drug-eluting stent with noncritical disease otherwise and normal LV function. His chest pain has markedly improved since that time.  Since I saw him  in the office 2 years ago he did moved to [facility] for a year.  He apparently was hospitalized there and underwent diagnostic cath that did not reveal disease tight enough to warrant intervention.  He apparently was diagnosed with inguinal hernia and is scheduled have this performed electively in the near future pending clearance.  Does continue to smoke a half a pack of cigarettes a day.  Does get chest pain on a weekly basis.   Current Meds  Medication Sig  . ALPRAZolam (XANAX) 1 MG tablet Take 1 mg by mouth at bedtime.  [name] amLODipine (NORVASC) 10 MG tablet Take 10 mg by mouth daily.  . clopidogrel (PLAVIX) 75 MG tablet Take 1 tablet (75 mg total) by mouth daily.  . metoprolol succinate (TOPROL-XL) 100 MG 24 hr tablet Take 1 tablet (100 mg total) by mouth daily. Take with or immediately following a meal: For high blood pressure  . nicotine (NICODERM CQ - DOSED IN MG/24 HOURS) 21 mg/24hr patch Place 1 patch (21 mg total) onto the skin  daily.  . nitroGLYCERIN (NITROSTAT) 0.4 MG SL tablet Place 1 tablet (0.4 mg total) under the tongue every 5 (five) minutes x 3 doses as needed for chest pain.  [name] oxyCODONE (OXY IR/ROXICODONE) 5 MG immediate release tablet Take 5 mg by mouth every 4 (four) hours as needed for severe pain.  . rosuvastatin (CRESTOR) 20 MG tablet Take 1 tablet (20 mg total) by mouth daily.  . tizanidine (ZANAFLEX) 6 MG capsule Take 6 mg by mouth 3 (three) times daily.     Allergies  Allergen Reactions  . Iodinated Diagnostic Agents Anaphylaxis and Swelling  . Iodine Anaphylaxis  . Ioxaglate Anaphylaxis and Swelling  . Metrizamide Anaphylaxis and Swelling  . Other Anaphylaxis, Shortness Of Breath and Swelling    Iv dye  . Codeine Itching and Other (See Comments)    Other reaction: restless  . Iohexol Other (See Comments)  . Latex Other (See Comments)    Unknown reaction  . Lisinopril Other (See Comments)    Unknown reaction  . Tuberculin Other (See Comments)    Other reaction: Severe skin reaction  . Tuberculin Tests Other (See Comments)    Severe skin reaction  . Valsartan Other (See Comments)    Unknown reaction  . Zolpidem Other (See Comments)    confusion    Social History   Socioeconomic History  . Marital status: Legally Separated    Spouse name: Not on file  . Number of children: Not on  file  . Years of education: Not on file  . Highest education level: Not on file  Occupational History  . Not on file  Tobacco Use  . Smoking status: Current Some Day Smoker    Packs/day: 0.50    Types: Cigarettes  . Smokeless tobacco: Never Used  Substance and Sexual Activity  . Alcohol use: Yes    Comment: daily  . Drug use: No  . Sexual activity: Not Currently  Other Topics Concern  . Not on file  Social History Narrative  . Not on file   Social Determinants of Health   Financial Resource Strain:   . Difficulty of Paying Living Expenses:   Food Insecurity:   . Worried About Patent examiner in the Last Year:   . Barista in the Last Year:   Transportation Needs:   . Freight forwarder (Medical):   [name] Lack of Transportation (Non-Medical):   Physical Activity:   . Days of Exercise per Week:   . Minutes of Exercise per Session:   Stress:   . Feeling of Stress :   Social Connections:   . Frequency of Communication with Friends and Family:   . Frequency of Social Gatherings with Friends and Family:   . Attends Religious Services:   . Active Member of Clubs or Organizations:   . Attends Banker Meetings:   [name] Marital Status:   Intimate Partner Violence:   . Fear of Current or Ex-Partner:   . Emotionally Abused:   [name] Physically Abused:   . Sexually Abused:      Review of Systems: General: negative for chills, fever, night sweats or weight changes.  Cardiovascular: negative for chest pain, dyspnea on exertion, edema, orthopnea, palpitations, paroxysmal nocturnal dyspnea or shortness of breath Dermatological: negative for rash Respiratory: negative for cough or wheezing Urologic: negative for hematuria Abdominal: negative for nausea, vomiting, diarrhea, bright red blood per rectum, melena, or hematemesis Neurologic: negative for visual changes, syncope, or dizziness All other systems reviewed and are otherwise negative except as noted above.    Blood pressure 124/62, pulse 61, height 6\' 1"  (1.854 m), weight 191 lb (86.6 kg).  General appearance: alert and no distress Neck: no adenopathy, no carotid bruit, no JVD, supple, symmetrical, trachea midline and thyroid not enlarged, symmetric, no tenderness/mass/nodules Lungs: clear to auscultation bilaterally Heart: regular rate and rhythm, S1, S2 normal, no murmur, click, rub or gallop Extremities: extremities normal, atraumatic, no cyanosis or edema Pulses: 2+ and symmetric Skin: Skin color, texture, turgor normal. No rashes or lesions Neurologic: Alert and oriented X 3, normal strength and  tone. Normal symmetric reflexes. Normal coordination and gait  EKG sinus rhythm at 61 without ST or T wave changes.  Personally reviewed this EKG.  ASSESSMENT AND PLAN:   Hypertension History of essential hypertension blood pressure measured today 124/62.  He is on amlodipine and metoprolol.  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 02/05/2018 with scattered noncritical disease on other places and normal LV function.  He was on aspirin and Brilinta.  I transitioned him to Plavix.  He apparently was in [facility] last year and had a diagnostic catheter did not show disease that warranted intervention.  Does get chest pain on a weekly basis.  He apparently is here for preop clearance before elective hernia repair.  I am going  to get a pharmacologic Myoview stress test and 2D echo to further evaluate  Hyperlipidemia History of hyperlipidemia on statin therapy.  We will recheck a lipid liver profile.      Runell Gess MD FACP,FACC,FAHA, [facility] 04/05/2020 4:02 PM

## 2020-04-05 NOTE — Patient Instructions (Addendum)
Medication Instructions:  NO CHANGE *If you need a refill on your cardiac medications before your next appointment, please call your pharmacy*   Lab Work: Your physician recommends that you return for lab work PRIOR TO EATING  If you have labs (blood work) drawn today and your tests are completely normal, you will receive your results only by: [name] MyChart Message (if you have MyChart) OR . A paper copy in the mail If you have any lab test that is abnormal or we need to change your treatment, we will call you to review the results.   Testing/Procedures: Your physician has requested that you have an echocardiogram. Echocardiography is a painless test that uses sound waves to create images of your heart. It provides your doctor with information about the size and shape of your heart and how well your heart's chambers and valves are working. This procedure takes approximately one hour. There are no restrictions for this procedure.[address]  Your physician has requested that you have a lexiscan myoview. For further information please visit [email or web address] Please follow instruction sheet, as given.       Follow-Up: At [facility], you and your health needs are our priority.  As part of our continuing mission to provide you with exceptional heart care, we have created designated Provider Care Teams.  These Care Teams include your primary Cardiologist (physician) and Advanced Practice Providers (APPs -  Physician Assistants and Nurse Practitioners) who all work together to provide you with the care you need, when you need it.  We recommend signing up for the patient portal called "MyChart".  Sign up information is provided on this After Visit Summary.  MyChart is used to connect with patients for Virtual Visits (Telemedicine).  Patients are able to view lab/test results, encounter notes, upcoming appointments, etc.  Non-urgent messages can be sent to your provider as well.   To  learn more about what you can do with MyChart, go to [email or web address].    Your next appointment:   12 month(s)  The format for your next appointment:   Either In Person or Virtual  Provider:   You may see Nanetta Batty MD or one of the following Advanced Practice Providers on your designated Care Team:    Corine Shelter, PA-C  [place], [place]  Edd Fabian, [place]

## 2020-04-05 NOTE — Assessment & Plan Note (Signed)
History of hyperlipidemia on statin therapy.  We will recheck a lipid liver profile 

## 2020-04-05 NOTE — Assessment & Plan Note (Signed)
History of essential hypertension blood pressure measured today 124/62.  He is on amlodipine and metoprolol.

## 2020-04-06 ENCOUNTER — Telehealth (HOSPITAL_COMMUNITY): Payer: Self-pay | Admitting: *Deleted

## 2020-04-06 NOTE — Telephone Encounter (Signed)
Patient given detailed instructions per Myocardial Perfusion Study Information Sheet for the test on 04/13/20 at 7:45. Patient notified to arrive 15 minutes early and that it is imperative to arrive on time for appointment to keep from having the test rescheduled.  If you need to cancel or reschedule your appointment, please call the office within 24 hours of your appointment. . Patient verbalized understanding.Raymond Melton

## 2020-04-07 ENCOUNTER — Ambulatory Visit (HOSPITAL_COMMUNITY)
Admission: RE | Admit: 2020-04-07 | Discharge: 2020-04-07 | Disposition: A | Discharge status: Home / Self Care | Payer: Medicare Other | Source: Ambulatory Visit | Attending: Cardiovascular Disease | Admitting: Cardiovascular Disease

## 2020-04-07 ENCOUNTER — Other Ambulatory Visit: Payer: Self-pay

## 2020-04-07 DIAGNOSIS — I10 Essential (primary) hypertension: Secondary | ICD-10-CM

## 2020-04-07 DIAGNOSIS — I2510 Atherosclerotic heart disease of native coronary artery without angina pectoris: Secondary | ICD-10-CM | POA: Diagnosis not present

## 2020-04-07 DIAGNOSIS — E119 Type 2 diabetes mellitus without complications: Secondary | ICD-10-CM | POA: Diagnosis not present

## 2020-04-07 DIAGNOSIS — E785 Hyperlipidemia, unspecified: Secondary | ICD-10-CM | POA: Insufficient documentation

## 2020-04-07 NOTE — Progress Notes (Signed)
  Echocardiogram 2D Echocardiogram has been performed.  Delcie Roch 04/07/2020, 3:45 PM

## 2020-04-11 ENCOUNTER — Ambulatory Visit: Payer: Medicare Other | Admitting: Cardiovascular Disease

## 2020-04-12 ENCOUNTER — Telehealth: Payer: Self-pay | Admitting: Cardiovascular Disease

## 2020-04-12 NOTE — Telephone Encounter (Signed)
Returned call to [employer] with [facility] office closed for lunch.

## 2020-04-12 NOTE — Telephone Encounter (Signed)
Ladona Ridgel from [facility] called. The patient came to their facility with a paper for lab orders. The orders were requested by Dr. Allyson Sabal for a full lipid panel prior to his surgery. Pt had a full lipid panel done 04-19 and his insurance will not cover a second set of labs. Ladona Ridgel said that she will be faxing those results to Dr. Allyson Sabal for him to evaluate. Please determine if those labs will be appropriate for surgery

## 2020-04-13 ENCOUNTER — Other Ambulatory Visit: Payer: Self-pay

## 2020-04-13 ENCOUNTER — Encounter (HOSPITAL_COMMUNITY): Payer: Medicare Other

## 2020-04-13 ENCOUNTER — Ambulatory Visit (INDEPENDENT_AMBULATORY_CARE_PROVIDER_SITE_OTHER): Payer: Medicare Other

## 2020-04-13 DIAGNOSIS — I2510 Atherosclerotic heart disease of native coronary artery without angina pectoris: Secondary | ICD-10-CM | POA: Diagnosis not present

## 2020-04-13 DIAGNOSIS — I10 Essential (primary) hypertension: Secondary | ICD-10-CM

## 2020-04-13 LAB — MYOCARDIAL PERFUSION IMAGING
LV dias vol: 96 mL (ref 62–150)
LV sys vol: 39 mL
Peak HR: 85 {beats}/min
Rest HR: 65 {beats}/min
SDS: 1
SRS: 2
SSS: 3
TID: 1.24

## 2020-04-13 MED ORDER — REGADENOSON 0.4 MG/5ML IV SOLN
0.4000 mg | Freq: Once | INTRAVENOUS | Status: AC
  Administered 2020-04-13: 0.4 mg via INTRAVENOUS

## 2020-04-13 MED ORDER — TECHNETIUM TC 99M TETROFOSMIN IV KIT
10.3000 | PACK | Freq: Once | INTRAVENOUS | Status: AC | PRN
  Administered 2020-04-13: 10.3 via INTRAVENOUS

## 2020-04-13 MED ORDER — TECHNETIUM TC 99M TETROFOSMIN IV KIT
33.0000 | PACK | Freq: Once | INTRAVENOUS | Status: AC | PRN
  Administered 2020-04-13: 33 via INTRAVENOUS

## 2020-04-13 NOTE — Telephone Encounter (Signed)
Raymond Melton is going to refax lab values from 4/19 ./cy

## 2020-04-14 ENCOUNTER — Telehealth: Payer: Self-pay

## 2020-04-14 NOTE — Telephone Encounter (Signed)
Spoke to patient myoview results given.Patient stated he recently had lab work with PCP Dr.Haque in [place].Dr.Haque's office called and labs requested to be faxed to office for Dr.Berry to review.

## 2020-04-17 NOTE — Telephone Encounter (Signed)
Patient following up. He states that Dr. Michel Harrow office has not received surgical clearance yet. Please fax to [phone]

## 2020-04-18 ENCOUNTER — Telehealth: Payer: Self-pay | Admitting: Cardiovascular Disease

## 2020-04-18 NOTE — Telephone Encounter (Signed)
   [facility] Pre-operative Risk Assessment    HEARTCARE STAFF: - Please ensure there is not already an duplicate clearance open for this procedure. - Under Visit Info/Reason for Call, type in Other and utilize the format Clearance MM/DD/YY or Clearance TBD. Do not use dashes or single digits. - If request is for dental extraction, please clarify the # of teeth to be extracted.  Request for surgical clearance:  1. What type of surgery is being performed? Robot assisted microscopic biladeral hernia repair   2. When is this surgery scheduled? TBD  3. What type of clearance is required (medical clearance vs. Pharmacy clearance to hold med vs. Both)? Both  4. Are there any medications that need to be held prior to surgery and how long? Plavix, leaving up to cardiology  5. Practice name and name of physician performing surgery? [facility], Dr. Kendell Bane  6. What is the office phone number? [phone]   7.   What is the office fax number? [phone]  8.   Anesthesia type (None, local, MAC, general) ? Not known   Raymond Melton 04/18/2020, 2:00 PM  _________________________________________________________________   (provider comments below)

## 2020-04-19 NOTE — Telephone Encounter (Signed)
   Waiting on Dr. Allyson Sabal to respond regarding holding Plavix.  Last office note needs to be included with clearance note.

## 2020-04-19 NOTE — Telephone Encounter (Signed)
Follow up    Isabelle Course is calling from [facility] to follow up on a clearance request.  She says pt is scheduled for the procedure on 04/25/20 and they are needing notes from last office appointments     Please advise

## 2020-04-20 NOTE — Telephone Encounter (Signed)
Raymond Melton with [facility] is calling to follow up in regards to cardiac clearance request. She states she is requesting notes from recent tests and office visits. Please call.

## 2020-04-20 NOTE — Telephone Encounter (Signed)
Okay to hold Plavix for his hernia repair

## 2020-04-20 NOTE — Telephone Encounter (Signed)
   Primary Cardiologist: Nanetta Batty, MD  Chart reviewed as part of pre-operative protocol coverage. Given past medical history and time since last visit, based on ACC/AHA guidelines, Michiah Mudry would be at acceptable risk for the planned procedure without further cardiovascular testing.   He may hold his Plavix for 5 days prior to his surgery and resume as soon as hemostasis is achieved.  I will route this recommendation to the requesting party via Epic fax function and remove from pre-op pool.  Please call with questions.  Raymond Melton. Cleaver NP-C    04/20/2020, 4:23 PM [facility] [address] Office [phone] Fax [phone]

## 2020-04-21 NOTE — Telephone Encounter (Signed)
OK to hold plavix for hernia repair

## 2020-04-25 NOTE — Telephone Encounter (Signed)
   Primary Cardiologist: Nanetta Batty, MD  Chart reviewed as part of pre-operative protocol coverage. Given past medical history and time since last visit, based on ACC/AHA guidelines, Raymond Melton would be at acceptable risk for the planned procedure without further cardiovascular testing.   I will route this recommendation to the requesting party via Epic fax function and remove from pre-op pool.  Please call with questions. Recent echocardiogram and stress test were normal. Patient may hold plavix for 5-7 days prior to the surgery and restart as soon as possible after the procedure at the surgeon's discretion.   Callback pool to inform the patient, I will forward this note to the surgeon's office  Azalee Course, PA 04/25/2020, 3:34 PM

## 2020-04-25 NOTE — Telephone Encounter (Signed)
Called the patient and informed him to hold his Plavix for 5-7 days prior to procedure on 05/02/20 and to restart as soon as possible at the discretion of the surgeon. Patient verbalized an understanding and all (if any) questions were answered.

## 2020-04-26 ENCOUNTER — Telehealth: Payer: Self-pay | Admitting: Cardiovascular Disease

## 2020-04-26 NOTE — Telephone Encounter (Signed)
Jacki Cones from [facility] is calling to get this patients recent EKG from 04/05/20 faxed over to pre-services. Fax number [phone], attention to [place].

## 2020-05-01 DIAGNOSIS — K4000 Bilateral inguinal hernia, with obstruction, without gangrene, not specified as recurrent: Secondary | ICD-10-CM | POA: Insufficient documentation

## 2020-09-26 ENCOUNTER — Telehealth: Payer: Self-pay | Admitting: Cardiovascular Disease

## 2020-09-26 NOTE — Telephone Encounter (Signed)
Pt is scheduled to see Dr. Allyson Sabal tomorrow at 11:00

## 2020-09-26 NOTE — Telephone Encounter (Signed)
Pt c/o of Chest Pain: STAT if CP now or developed within 24 hours  1. Are you having CP right now? No  2. Are you experiencing any other symptoms (ex. SOB, nausea, vomiting, sweating)? Sweating last night   3. How long have you been experiencing CP?  Off and on for about two weeks.    4. Is your CP continuous or coming and going? continuous  5. Have you taken Nitroglycerin? no ?  Pt says he gets like a radiating pain that goes down his left arm. It is not a chest pain but the pain in his arm was a pain he ignored before he had to get stents put in. He is also concerned because he has to stop his plavix today for a dentist appointment on Saturday.  He doesn't want to take a nitro because he gets unstable after he takes one  (he has to lay down and he gets a headache). He said if the pain is bad enough he would take one if he needed to .

## 2020-09-26 NOTE — Telephone Encounter (Signed)
Patient with hx of intermittent chest pain for past 2 weeks. Occasional radiation to L arm. Feeling well during my conversation. Advised to go to ER or call EMS if worsening symptoms. Did not tried nitro.

## 2020-09-27 ENCOUNTER — Ambulatory Visit (INDEPENDENT_AMBULATORY_CARE_PROVIDER_SITE_OTHER): Payer: Medicare Other | Admitting: Cardiovascular Disease

## 2020-09-27 ENCOUNTER — Other Ambulatory Visit: Payer: Self-pay

## 2020-09-27 ENCOUNTER — Encounter: Payer: Self-pay | Admitting: Cardiovascular Disease

## 2020-09-27 VITALS — BP 126/76 | HR 55 | Ht 73.0 in | Wt 184.2 lb

## 2020-09-27 DIAGNOSIS — I25750 Atherosclerosis of native coronary artery of transplanted heart with unstable angina: Secondary | ICD-10-CM | POA: Diagnosis not present

## 2020-09-27 DIAGNOSIS — I2510 Atherosclerotic heart disease of native coronary artery without angina pectoris: Secondary | ICD-10-CM | POA: Diagnosis not present

## 2020-09-27 DIAGNOSIS — I10 Essential (primary) hypertension: Secondary | ICD-10-CM | POA: Diagnosis not present

## 2020-09-27 DIAGNOSIS — E782 Mixed hyperlipidemia: Secondary | ICD-10-CM | POA: Diagnosis not present

## 2020-09-27 DIAGNOSIS — Z720 Tobacco use: Secondary | ICD-10-CM | POA: Insufficient documentation

## 2020-09-27 MED ORDER — ISOSORBIDE MONONITRATE ER 30 MG PO TB24: 30.0000 mg | ORAL_TABLET | Freq: Every day | ORAL | 3 refills | Status: DC

## 2020-09-27 NOTE — Assessment & Plan Note (Signed)
History of ongoing tobacco abuse of 1/2 pack/day recalcitrant to risk factor modification. 

## 2020-09-27 NOTE — Assessment & Plan Note (Signed)
History of CAD status post obtuse marginal branch stenting by Dr. Katrinka Blazing 02/05/2018 with noncritical disease otherwise.  He underwent cardiac catheterization in [facility] several years ago and was not found to have disease requiring intervention.  He does get chest pain several times a week but does not take sublingual nitroglycerin.  The pain radiates down his left arm and into his chest.  This has not really changed in size from 6 months ago at which time I had done a Myoview stress test which was nonischemic.  I am going to begin him on Imdur 30 mg a day.  I will see him back in 2 months.  If he continues to have chest pain he may require repeat coronary angiography.

## 2020-09-27 NOTE — Patient Instructions (Addendum)
Medication Instructions:  Start Imdur 30mg  once daily. *If you need a refill on your cardiac medications before your next appointment, please call your pharmacy*   Lab Work: Your physician recommends that you have labs today: lipid panel and liver function.  If you have labs (blood work) drawn today and your tests are completely normal, you will receive your results only by:  MyChart Message (if you have MyChart) OR  A paper copy in the mail If you have any lab test that is abnormal or we need to change your treatment, we will call you to review the results.  Follow-Up: At [facility], you and your health needs are our priority.  As part of our continuing mission to provide you with exceptional heart care, we have created designated Provider Care Teams.  These Care Teams include your primary Cardiologist (physician) and Advanced Practice Providers (APPs -  Physician Assistants and Nurse Practitioners) who all work together to provide you with the care you need, when you need it.  We recommend signing up for the patient portal called "MyChart".  Sign up information is provided on this After Visit Summary.  MyChart is used to connect with patients for Virtual Visits (Telemedicine).  Patients are able to view lab/test results, encounter notes, upcoming appointments, etc.  Non-urgent messages can be sent to your provider as well.   To learn more about what you can do with MyChart, go to [facility].    Your next appointment:   2 month(s)  The format for your next appointment:   In Person  Provider:   [email or web address], MD

## 2020-09-27 NOTE — Assessment & Plan Note (Signed)
History of hyperlipidemia on statin therapy.  We will recheck a lipid liver profile 

## 2020-09-27 NOTE — Progress Notes (Signed)
09/27/2020 [name] [facility]   22-May-1956  [id number]  Primary Physician Patient, No Pcp Per Primary Cardiologist: Runell Gess MD [name], [place], [place]  HPI:  Raymond Melton is a 64 y.o.  mildly overweight married Caucasian male with no children who currently does not work.I last saw him in the office  04/05/2020.  He is accompanied by his wife Raymond Melton today. He has a long history of tobacco abuse smoking one half pack a day until recently when he dramatically reduce this. He does have a history of treated hypertension and diabetes. There is a strong family history of heart disease. He's never had a heart attack or stroke. He was admitted with unstable angina and underwent diagnostic coronary angiography by Dr. Katrinka Blazing on 02/05/18 revealed a high-grade obtuse marginal branch stenosis which was successfully stented using a synergy drug-eluting stent with noncritical disease otherwise and normal LV function. His chest pain has markedly improved since that time.  He had moved to [facility] for a year.  He apparently was hospitalized there and underwent diagnostic cath that did not reveal disease tight enough to warrant intervention.  He apparently was diagnosed with inguinal hernia and underwent elective hernia operation after I saw him in May 2021.  I had performed a Myoview stress test 04/13/2020 which was low risk and nonischemic.   He was getting fairly frequent chest pain when I saw him 6 months ago which she continues to get.  He also continues to smoke a half a pack a day of tobacco recalcitrant to receptor modification.  His wife did tell me that he is becoming more forgetful.  I suggested that he see his primary care doctor to be referred to a neurologist for formal neuropsychiatric/cognitive testing  Current Meds  Medication Sig  . amLODipine (NORVASC) 10 MG tablet Take 10 mg by mouth daily.  . clopidogrel (PLAVIX) 75 MG tablet Take 1 tablet (75 mg total) by mouth  daily.  . metoprolol succinate (TOPROL-XL) 100 MG 24 hr tablet Take 1 tablet (100 mg total) by mouth daily. Take with or immediately following a meal: For high blood pressure  . nitroGLYCERIN (NITROSTAT) 0.4 MG SL tablet Place 1 tablet (0.4 mg total) under the tongue every 5 (five) minutes x 3 doses as needed for chest pain.  . rosuvastatin (CRESTOR) 20 MG tablet Take 1 tablet (20 mg total) by mouth daily.     Allergies  Allergen Reactions  . Iodinated Diagnostic Agents Anaphylaxis and Swelling  . Iodine Anaphylaxis  . Ioxaglate Anaphylaxis and Swelling  . Metrizamide Anaphylaxis and Swelling  . Other Anaphylaxis, Shortness Of Breath and Swelling    Iv dye  . Codeine Itching and Other (See Comments)    Other reaction: restless  . Iohexol Other (See Comments)  . Latex Other (See Comments)    Unknown reaction  . Lisinopril Other (See Comments)    Unknown reaction  . Tuberculin Other (See Comments)    Other reaction: Severe skin reaction  . Tuberculin Tests Other (See Comments)    Severe skin reaction  . Valsartan Other (See Comments)    Unknown reaction  . Zolpidem Other (See Comments)    confusion    Social History   Socioeconomic History  . Marital status: Legally Separated    Spouse name: Not on file  . Number of children: Not on file  . Years of education: Not on file  . Highest education level: Not on file  Occupational  History  . Not on file  Tobacco Use  . Smoking status: Current Some Day Smoker    Packs/day: 0.50    Types: Cigarettes  . Smokeless tobacco: Never Used  Substance and Sexual Activity  . Alcohol use: Yes    Comment: daily  . Drug use: No  . Sexual activity: Not Currently  Other Topics Concern  . Not on file  Social History Narrative  . Not on file   Social Determinants of Health   Financial Resource Strain:   . Difficulty of Paying Living Expenses: Not on file  Food Insecurity:   . Worried About Programme researcher, broadcasting/film/video in the Last Year: Not  on file  . Ran Out of Food in the Last Year: Not on file  Transportation Needs:   . Lack of Transportation (Medical): Not on file  . Lack of Transportation (Non-Medical): Not on file  Physical Activity:   . Days of Exercise per Week: Not on file  . Minutes of Exercise per Session: Not on file  Stress:   . Feeling of Stress : Not on file  Social Connections:   . Frequency of Communication with Friends and Family: Not on file  . Frequency of Social Gatherings with Friends and Family: Not on file  . Attends Religious Services: Not on file  . Active Member of Clubs or Organizations: Not on file  . Attends Banker Meetings: Not on file  . Marital Status: Not on file  Intimate Partner Violence:   . Fear of Current or Ex-Partner: Not on file  . Emotionally Abused: Not on file  . Physically Abused: Not on file  . Sexually Abused: Not on file     Review of Systems: General: negative for chills, fever, night sweats or weight changes.  Cardiovascular: negative for chest pain, dyspnea on exertion, edema, orthopnea, palpitations, paroxysmal nocturnal dyspnea or shortness of breath Dermatological: negative for rash Respiratory: negative for cough or wheezing Urologic: negative for hematuria Abdominal: negative for nausea, vomiting, diarrhea, bright red blood per rectum, melena, or hematemesis Neurologic: negative for visual changes, syncope, or dizziness All other systems reviewed and are otherwise negative except as noted above.    Blood pressure 126/76, pulse (!) 55, height 6\' 1"  (1.854 m), weight 184 lb 3.2 oz (83.6 kg), SpO2 95 %.  General appearance: alert and no distress Neck: no adenopathy, no carotid bruit, no JVD, supple, symmetrical, trachea midline and thyroid not enlarged, symmetric, no tenderness/mass/nodules Lungs: clear to auscultation bilaterally Heart: regular rate and rhythm, S1, S2 normal, no murmur, click, rub or gallop Extremities: extremities normal,  atraumatic, no cyanosis or edema Pulses: 2+ and symmetric Skin: Skin color, texture, turgor normal. No rashes or lesions Neurologic: Alert and oriented X 3, normal strength and tone. Normal symmetric reflexes. Normal coordination and gait  EKG sinus bradycardia 55 without ST or T wave changes.  I personally reviewed this EKG  ASSESSMENT AND PLAN:   Hypertension History of essential hypertension with blood pressure measured today at 126/76.  He is on amlodipine and metoprolol.  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 02/05/2018 with noncritical disease otherwise.  He underwent cardiac catheterization in [facility] several years ago and was not found to have disease requiring intervention.  He does get chest pain several times a week but does not take sublingual nitroglycerin.  The pain radiates down  his left arm and into his chest.  This has not really changed in size from 6 months ago at which time I had done a Myoview stress test which was nonischemic.  I am going to begin him on Imdur 30 mg a day.  I will see him back in 2 months.  If he continues to have chest pain he may require repeat coronary angiography.  Hyperlipidemia History of hyperlipidemia on statin therapy.  We will recheck a lipid liver profile.  Tobacco abuse History of ongoing tobacco abuse of 1/2 pack/day recalcitrant to risk factor modification.      Runell Gess MD FACP,FACC,FAHA, [facility] 09/27/2020 11:33 AM

## 2020-09-27 NOTE — Assessment & Plan Note (Signed)
History of essential hypertension with blood pressure measured today at 126/76.  He is on amlodipine and metoprolol.

## 2020-09-28 LAB — LIPID PANEL
Chol/HDL Ratio: 3.2 ratio (ref 0.0–5.0)
Cholesterol, Total: 120 mg/dL (ref 100–199)
HDL: 38 mg/dL — ABNORMAL LOW (ref >39)
LDL Chol Calc (NIH): 41 mg/dL (ref 0–99)
Triglycerides: 264 mg/dL — ABNORMAL HIGH (ref 0–149)
VLDL Cholesterol Cal: 41 mg/dL — ABNORMAL HIGH (ref 5–40)

## 2020-09-28 LAB — HEPATIC FUNCTION PANEL
ALT: 18 IU/L (ref 0–44)
AST: 29 IU/L (ref 0–40)
Albumin: 4.7 g/dL (ref 3.8–4.8)
Alkaline Phosphatase: 157 IU/L — ABNORMAL HIGH (ref 44–121)
Bilirubin Total: 0.4 mg/dL (ref 0.0–1.2)
Bilirubin, Direct: 0.09 mg/dL (ref 0.00–0.40)
Total Protein: 7.9 g/dL (ref 6.0–8.5)

## 2020-09-29 ENCOUNTER — Telehealth: Payer: Self-pay | Admitting: Cardiovascular Disease

## 2020-09-29 NOTE — Telephone Encounter (Signed)
Patient calling to request someone give him a call to go over the results from his lab work done on 09/27/2020. Please call/advise.   Thank you!

## 2020-09-29 NOTE — Telephone Encounter (Signed)
Spoke with patient. Lab results reviewed. Patient to follow up with PCP. No further questions at this time.

## 2020-10-02 ENCOUNTER — Telehealth: Payer: Self-pay | Admitting: Cardiovascular Disease

## 2020-10-02 NOTE — Telephone Encounter (Signed)
Faxed via Epic as requested.

## 2020-10-02 NOTE — Telephone Encounter (Signed)
Patient called to request that all results for lab/blood work done on 09/27/20 are faxed over to Dr. Leona Carry Haque's office. The fax number is [phone].   Thank you!

## 2020-10-04 ENCOUNTER — Encounter: Payer: Self-pay | Admitting: Neurology

## 2020-10-26 ENCOUNTER — Other Ambulatory Visit: Payer: Self-pay

## 2020-10-26 ENCOUNTER — Ambulatory Visit (INDEPENDENT_AMBULATORY_CARE_PROVIDER_SITE_OTHER): Payer: Medicare Other | Admitting: Neurology

## 2020-10-26 ENCOUNTER — Encounter: Payer: Self-pay | Admitting: Neurology

## 2020-10-26 ENCOUNTER — Other Ambulatory Visit (INDEPENDENT_AMBULATORY_CARE_PROVIDER_SITE_OTHER): Payer: Medicare Other

## 2020-10-26 ENCOUNTER — Ambulatory Visit: Payer: Medicare Other | Admitting: Neurology

## 2020-10-26 VITALS — BP 131/55 | HR 98 | Ht 73.0 in | Wt 185.0 lb

## 2020-10-26 DIAGNOSIS — F1120 Opioid dependence, uncomplicated: Secondary | ICD-10-CM

## 2020-10-26 DIAGNOSIS — R4189 Other symptoms and signs involving cognitive functions and awareness: Secondary | ICD-10-CM

## 2020-10-26 NOTE — Patient Instructions (Signed)
1.  Will check labs:  B1, B12, TSH, RPR, serum and urine drug screen 2.  Refer to neurocognitive testing 3.  Will get hospital notes from [place] 4.  Follow up in

## 2020-10-26 NOTE — Progress Notes (Signed)
Patient refused lab work said he had nothing to drink.  Said he would take the order and go to [facility], advise the CMA patient refused and therefore I was unable to collect labs.  Advise CMA to let the doctor know patient refused lab draw.

## 2020-10-26 NOTE — Progress Notes (Addendum)
NEUROLOGY CONSULTATION NOTE  Raymond Melton MRN: [id number] DOB: 1956/07/06  Referring provider: Angelina Pih, MD Primary care provider: Angelina Pih, MD  Reason for consult:  Stroke, memory loss   Subjective:  Raymond Melton is a 64 year old right-handed male with hepatitis C, substance abuse, PTSD, HTN and diabetes who presents for memory deficits and TIA.  History supplemented by prior neurology notes.  He is accompanied by his wife who supplements history.  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  He also reports history of multiple concussions as well as suffering from PTSD.  He had C5-C7 cervical spine fusion in 2018.  He reports cognitive impairment following the surgery.  He has prior history of alcohol abuse and opiate abuse.  He was on Oxycontin prior to the surgery for neck pain.  The neck surgery was a failure and he continued Oxycontin afterwards.  He had an MI in 2019 and was told that revision of his cervical fusion would no longer be performed.  In addition to Oxycontin, he was taking Xanax.  He reported intermittent episodes of confusion with associated slurred speech and gait instability, lasting anywhere from 2 hours to 2 days.  He is a diabetic and blood sugars reportedly normal during these events.  He has undergone several brain MRIs which have showed mild chronic small vessel ischemic changes but no acute intracranial abnormalities.  MRA of head and neck from 12/25/2017 personally reviewed was negative.  EEG from 01/06/2018 was normal.  Even though he had reported no benzodiazepine use, he apparently had continued to take Xanax.  His last neurologist determined that his episodic confusion was likely related to benzodiazepine use.  He went to [place] in 2020 where he was in rehab for almost a year.  He finished in mid 2021.  During that time, he reportedly was hospitalized for a stroke in [place].  He reports  worsening short term memory loss such as word-finding difficulty, forgetting what he wanted to say or just difficulty concentrating  He reports that since discharge from rehab, he has not been using any abusive substances.     PAST MEDICAL HISTORY: Past Medical History:  Diagnosis Date  . Alcohol abuse   . Altered mental status 12/17/2017  . Chronic pain   . Diabetes mellitus without complication (HCC)   . Hepatitis C   . Hypertension   . Immune deficiency disorder (HCC)   . Kidney stones   . TB lung, latent     PAST SURGICAL HISTORY: Past Surgical History:  Procedure Laterality Date  . ABDOMINAL SURGERY    . ANKLE SURGERY    . CHOLECYSTECTOMY    . CORONARY STENT INTERVENTION N/A 02/05/2018   Procedure: CORONARY STENT INTERVENTION;  Surgeon: Lyn Records, MD;  Location: [facility] INVASIVE CV LAB;  Service: Cardiovascular;  Laterality: N/A;  . ELBOW SURGERY    . EYE SURGERY    . HERNIA REPAIR    . INTRAVASCULAR PRESSURE WIRE/FFR STUDY N/A 02/05/2018   Procedure: INTRAVASCULAR PRESSURE WIRE/FFR STUDY;  Surgeon: Lyn Records, MD;  Location: [facility] INVASIVE CV LAB;  Service: Cardiovascular;  Laterality: N/A;  . LEFT HEART CATH AND CORONARY ANGIOGRAPHY N/A 02/05/2018   Procedure: LEFT HEART CATH AND CORONARY ANGIOGRAPHY;  Surgeon: Lyn Records, MD;  Location: [facility] INVASIVE CV LAB;  Service: Cardiovascular;  Laterality: N/A;  . WRIST SURGERY      MEDICATIONS: Current Outpatient Medications on File Prior  to Visit  Medication Sig Dispense Refill  . amLODipine (NORVASC) 10 MG tablet Take 10 mg by mouth daily.    . clopidogrel (PLAVIX) 75 MG tablet Take 1 tablet (75 mg total) by mouth daily. 90 tablet 3  . isosorbide mononitrate (IMDUR) 30 MG 24 hr tablet Take 1 tablet (30 mg total) by mouth daily. 90 tablet 3  . metoprolol succinate (TOPROL-XL) 100 MG 24 hr tablet Take 1 tablet (100 mg total) by mouth daily. Take with or immediately following a meal: For high blood pressure    . nitroGLYCERIN  (NITROSTAT) 0.4 MG SL tablet Place 1 tablet (0.4 mg total) under the tongue every 5 (five) minutes x 3 doses as needed for chest pain. 30 tablet 12  . rosuvastatin (CRESTOR) 20 MG tablet Take 1 tablet (20 mg total) by mouth daily. 90 tablet 3   No current facility-administered medications on file prior to visit.    ALLERGIES: Allergies  Allergen Reactions  . Iodinated Diagnostic Agents Anaphylaxis and Swelling  . Iodine Anaphylaxis  . Ioxaglate Anaphylaxis and Swelling  . Metrizamide Anaphylaxis and Swelling  . Other Anaphylaxis, Shortness Of Breath and Swelling    Iv dye  . Codeine Itching and Other (See Comments)    Other reaction: restless  . Iohexol Other (See Comments)  . Latex Other (See Comments)    Unknown reaction  . Lisinopril Other (See Comments)    Unknown reaction  . Tuberculin Other (See Comments)    Other reaction: Severe skin reaction  . Tuberculin Tests Other (See Comments)    Severe skin reaction  . Valsartan Other (See Comments)    Unknown reaction  . Zolpidem Other (See Comments)    confusion    FAMILY HISTORY: Family History  Problem Relation Age of Onset  . Depression Mother   . Hypertension Mother   . Leukemia Mother   . Stroke Father   . Hypertension Father   . Heart attack Father   . Cancer Sister   . Hypertension Sister   . Cancer Brother        bladder  . Hypertension Brother   . Heart attack Brother     SOCIAL HISTORY: Social History   Socioeconomic History  . Marital status: Legally Separated    Spouse name: Not on file  . Number of children: Not on file  . Years of education: Not on file  . Highest education level: Not on file  Occupational History  . Not on file  Tobacco Use  . Smoking status: Current Some Day Smoker    Packs/day: 0.50    Types: Cigarettes  . Smokeless tobacco: Never Used  Substance and Sexual Activity  . Alcohol use: Yes    Comment: daily  . Drug use: No  . Sexual activity: Not Currently  Other  Topics Concern  . Not on file  Social History Narrative  . Not on file   Social Determinants of Health   Financial Resource Strain:   . Difficulty of Paying Living Expenses: Not on file  Food Insecurity:   . Worried About Programme researcher, broadcasting/film/video in the Last Year: Not on file  . Ran Out of Food in the Last Year: Not on file  Transportation Needs:   . Lack of Transportation (Medical): Not on file  . Lack of Transportation (Non-Medical): Not on file  Physical Activity:   . Days of Exercise per Week: Not on file  . Minutes of Exercise per Session: Not on file  Stress:   . Feeling of Stress : Not on file  Social Connections:   . Frequency of Communication with Friends and Family: Not on file  . Frequency of Social Gatherings with Friends and Family: Not on file  . Attends Religious Services: Not on file  . Active Member of Clubs or Organizations: Not on file  . Attends Banker Meetings: Not on file  . Marital Status: Not on file  Intimate Partner Violence:   . Fear of Current or Ex-Partner: Not on file  . Emotionally Abused: Not on file  . Physically Abused: Not on file  . Sexually Abused: Not on file    Objective:  Blood pressure (!) 131/55, pulse 98, height 6\' 1"  (1.854 m), weight 185 lb (83.9 kg), SpO2 98 %. General: No acute distress.  Patient appears well-groomed.   Head:  Normocephalic/atraumatic Eyes:  fundi examined but not visualized Neck: supple, no paraspinal tenderness, full range of motion Back: No paraspinal tenderness Heart: regular rate and rhythm Lungs: Clear to auscultation bilaterally. Vascular: No carotid bruits. Neurological Exam: Mental status:  [employer] Mental Exam 10/26/2020  Weekday Correct 0  Current year 1  What state are we in? 1  Amount spent 0  Amount left 0  # of Animals 2  5 objects recall 3  Number series 0  Hour markers 0  Time correct 0  Placed X in triangle correctly 1  Largest Figure 1  Name of male 2    Date back to work 0  Type of work 2  State she lived in 0  Total score 13   Cranial nerves: CN I: not tested CN II: right pupil round and reactive.  Prosthetic left eye, visual fields intact CN III, IV, VI:  full range of motion, no nystagmus, no ptosis CN V: facial sensation intact. CN VII: upper and lower face symmetric CN VIII: hearing intact CN IX, X: gag intact, uvula midline CN XI: sternocleidomastoid and trapezius muscles intact CN XII: tongue midline Bulk & Tone: normal, no fasciculations. Motor:  muscle strength 5-/5 left hand grip, otherwise, 5/5 throughout Sensation:  Pinprick sensation reduced in left hand and foot; vibratory sensation intact. Deep Tendon Reflexes:  2+ throughout,  toes downgoing.   Finger to nose testing:  Without dysmetria.   Heel to shin:  Without dysmetria.   Gait:  Normal station and stride.  Romberg negative.  Assessment/Plan:   Cognitive impairment.  May be related longstanding history of substance abuse, multiple concussions or underlying psychiatric disorder.  He also reports history of stroke while in [place].  Unfortunately, I do not have any records and therefore have no details regarding this.  My plan was to order a neuropsychological evaluation as well as labs including B1, B12, TSH and RPR.  Given his prior history of not being truthful regarding his substance use, as well as the inaccurate neuropsychological testing results if he is using, I also wanted to check a drug screen.  He apparently refused this testing.  Due to noncompliance, he will be discharged from our practice.  Thank you for allowing me to take part in the care of this patient.  [place], DO  CC: Shon Millet, MD

## 2020-10-27 ENCOUNTER — Telehealth: Payer: Self-pay | Admitting: Neurology

## 2020-10-27 NOTE — Telephone Encounter (Signed)
Patient dismissed from [facility] by Shon Millet DO, effective 10/27/20. Dismissal letter sent out by regular mail. JG

## 2020-12-19 ENCOUNTER — Encounter: Payer: Medicare Other | Admitting: Psychology

## 2020-12-20 ENCOUNTER — Other Ambulatory Visit: Payer: Self-pay

## 2020-12-20 ENCOUNTER — Encounter: Payer: Self-pay | Admitting: Cardiovascular Disease

## 2020-12-20 ENCOUNTER — Ambulatory Visit (INDEPENDENT_AMBULATORY_CARE_PROVIDER_SITE_OTHER): Payer: Medicare Other | Admitting: Cardiovascular Disease

## 2020-12-20 DIAGNOSIS — E782 Mixed hyperlipidemia: Secondary | ICD-10-CM

## 2020-12-20 DIAGNOSIS — I10 Essential (primary) hypertension: Secondary | ICD-10-CM

## 2020-12-20 DIAGNOSIS — I25750 Atherosclerosis of native coronary artery of transplanted heart with unstable angina: Secondary | ICD-10-CM

## 2020-12-20 NOTE — Assessment & Plan Note (Signed)
History of hyper lipidemia on statin therapy with lipid profile performed 09/27/2020 revealing total cholesterol 120, LDL 41 and HDL 38.

## 2020-12-20 NOTE — Patient Instructions (Addendum)
Medication Instructions:  Stop taking Imdur.  *If you need a refill on your cardiac medications before your next appointment, please call your pharmacy*    Follow-Up: At [facility], you and your health needs are our priority.  As part of our continuing mission to provide you with exceptional heart care, we have created designated Provider Care Teams.  These Care Teams include your primary Cardiologist (physician) and Advanced Practice Providers (APPs -  Physician Assistants and Nurse Practitioners) who all work together to provide you with the care you need, when you need it.  We recommend signing up for the patient portal called "MyChart".  Sign up information is provided on this After Visit Summary.  MyChart is used to connect with patients for Virtual Visits (Telemedicine).  Patients are able to view lab/test results, encounter notes, upcoming appointments, etc.  Non-urgent messages can be sent to your provider as well.   To learn more about what you can do with MyChart, go to [email or web address].    Your next appointment:   6 month(s)  The format for your next appointment:   In Person  Provider:   You will see one of the following Advanced Practice Providers on your designated Care Team:    Corine Shelter, PA-C  [place], [place]  Edd Fabian, [place]  Then, Nanetta Batty, MD will plan to see you again in 12 month(s).

## 2020-12-20 NOTE — Progress Notes (Signed)
12/20/2020 Verdell Dykman [facility]   [birth year]  [id number]  Primary Physician Galvin Proffer, MD Primary Cardiologist: Runell Gess MD [name], [place], [place]  HPI:  Raymond Melton is a 65 y.o.  mildly overweight married Caucasian male with no children who currently does not work.I last saw him in the office  09/27/2020. He has a long history of tobacco abuse smoking one half pack a day until recently when he dramatically reduce this. He does have a history of treated hypertension and diabetes. There is a strong family history of heart disease. He's never had a heart attack or stroke. He was admitted with unstable angina and underwent diagnostic coronary angiography by Dr. Katrinka Blazing on 02/05/18 revealed a high-grade obtuse marginal branch stenosis which was successfully stented using a synergy drug-eluting stent with noncritical disease otherwise and normal LV function. His chest pain has markedly improved since that time.  He had moved to [facility] for a year. He apparently was hospitalized there and underwent diagnostic cath that did not reveal disease tight enough to warrant intervention. He apparently was diagnosed with inguinal hernia and underwent elective hernia operation after I saw him in May 2021.  I had performed a Myoview stress test 04/13/2020 which was low risk and nonischemic.  He was getting fairly frequent chest pain when I saw him 6 months ago which she continues to get.   His wife did tell me that he is becoming more forgetful.  I suggested that he see his primary care doctor to be referred to a neurologist for formal neuropsychiatric/cognitive testing.  Since I saw him 3 months ago he did get neuropsychiatric testing but unfortunately did not get along well with his physician.  He did stop smoking 1/2 weeks ago.  He does get some pain in his left upper extremity which sounds orthopedic or neuropathic.   Current Meds  Medication Sig  . albuterol (VENTOLIN  HFA) 108 (90 Base) MCG/ACT inhaler Inhale into the lungs.  [name] amLODipine (NORVASC) 10 MG tablet Take 10 mg by mouth daily.  [name] amoxicillin-clavulanate (AUGMENTIN) 875-125 MG tablet Take 1 tablet by mouth 2 (two) times daily.  . ARIPiprazole (ABILIFY) 5 MG tablet Take by mouth.  . clopidogrel (PLAVIX) 75 MG tablet Take 1 tablet (75 mg total) by mouth daily.  [name] dronabinol (MARINOL) 10 MG capsule 1 capsule  . metoprolol succinate (TOPROL-XL) 100 MG 24 hr tablet Take 1 tablet (100 mg total) by mouth daily. Take with or immediately following a meal: For high blood pressure  . nitroGLYCERIN (NITROSTAT) 0.4 MG SL tablet Place 1 tablet (0.4 mg total) under the tongue every 5 (five) minutes x 3 doses as needed for chest pain.  . rosuvastatin (CRESTOR) 20 MG tablet Take 1 tablet (20 mg total) by mouth daily.  [name] tiZANidine (ZANAFLEX) 4 MG tablet Take 4 mg by mouth 3 (three) times daily as needed.  . TRULICITY 0.75 MG/0.5ML SOPN Inject into the skin.  . [DISCONTINUED] isosorbide mononitrate (IMDUR) 30 MG 24 hr tablet Take 1 tablet (30 mg total) by mouth daily.     Allergies  Allergen Reactions  . Iodinated Diagnostic Agents Anaphylaxis and Swelling  . Iodine Anaphylaxis  . Ioxaglate Anaphylaxis and Swelling  . Metrizamide Anaphylaxis and Swelling  . Other Anaphylaxis, Shortness Of Breath and Swelling    Iv dye  . Codeine Itching and Other (See Comments)    Other reaction: restless  . Iohexol Other (See Comments)  . Latex  Other (See Comments)    Unknown reaction  . Lisinopril Other (See Comments)    Unknown reaction  . Metformin Hcl Other (See Comments)  . Metoclopramide Other (See Comments)  . Mirtazapine Other (See Comments)  . Tuberculin Other (See Comments)    Other reaction: Severe skin reaction  . Tuberculin Tests Other (See Comments)    Severe skin reaction  . Valsartan Other (See Comments)    Unknown reaction  . Zolpidem Other (See Comments)    confusion    Social History    Socioeconomic History  . Marital status: Legally Separated    Spouse name: Not on file  . Number of children: Not on file  . Years of education: Not on file  . Highest education level: Not on file  Occupational History  . Not on file  Tobacco Use  . Smoking status: Current Some Day Smoker    Packs/day: 0.50    Types: Cigarettes  . Smokeless tobacco: Never Used  Vaping Use  . Vaping Use: Some days  Substance and Sexual Activity  . Alcohol use: Yes    Comment: daily  . Drug use: No  . Sexual activity: Not Currently  Other Topics Concern  . Not on file  Social History Narrative   Right handed   Social Determinants of Health   Financial Resource Strain: Not on file  Food Insecurity: Not on file  Transportation Needs: Not on file  Physical Activity: Not on file  Stress: Not on file  Social Connections: Not on file  Intimate Partner Violence: Not on file     Review of Systems: General: negative for chills, fever, night sweats or weight changes.  Cardiovascular: negative for chest pain, dyspnea on exertion, edema, orthopnea, palpitations, paroxysmal nocturnal dyspnea or shortness of breath Dermatological: negative for rash Respiratory: negative for cough or wheezing Urologic: negative for hematuria Abdominal: negative for nausea, vomiting, diarrhea, bright red blood per rectum, melena, or hematemesis Neurologic: negative for visual changes, syncope, or dizziness All other systems reviewed and are otherwise negative except as noted above.    Blood pressure 128/70, pulse 70, height 6\' 1"  (1.854 m), weight 190 lb 9.6 oz (86.5 kg).  General appearance: alert and no distress Neck: no adenopathy, no carotid bruit, no JVD, supple, symmetrical, trachea midline and thyroid not enlarged, symmetric, no tenderness/mass/nodules Lungs: clear to auscultation bilaterally Heart: regular rate and rhythm, S1, S2 normal, no murmur, click, rub or gallop Extremities: extremities normal,  atraumatic, no cyanosis or edema Pulses: 2+ and symmetric Skin: Skin color, texture, turgor normal. No rashes or lesions Neurologic: Alert and oriented X 3, normal strength and tone. Normal symmetric reflexes. Normal coordination and gait  EKG not performed today  ASSESSMENT AND PLAN:   Hypertension History of essential hypertension a blood pressure measured today at 128/70.  He is on metoprolol, and amlodipine.  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 02/05/2018 without significant residual CAD.  He was cath a year later in [facility] revealing patent stent without significant residual CAD.  Myoview performed 04/13/2020 was low risk and nonischemic.  He has occasional atypical noncardiac chest pain.  Hyperlipidemia History of hyper lipidemia on statin therapy with lipid profile performed 09/27/2020 revealing total cholesterol 120, LDL 41 and HDL 38.      13/01/2020 MD [facility], [facility] 12/20/2020 2:31 PM

## 2020-12-20 NOTE — Assessment & Plan Note (Signed)
History of essential hypertension a blood pressure measured today at 128/70.  He is on metoprolol, and amlodipine.

## 2020-12-20 NOTE — Assessment & Plan Note (Signed)
History of CAD status post circumflex obtuse marginal branch stenting by Dr. Katrinka Blazing 02/05/2018 without significant residual CAD.  He was cath a year later in [facility] revealing patent stent without significant residual CAD.  Myoview performed 04/13/2020 was low risk and nonischemic.  He has occasional atypical noncardiac chest pain.

## 2020-12-26 ENCOUNTER — Encounter: Payer: Medicare Other | Admitting: Psychology

## 2021-02-28 ENCOUNTER — Telehealth: Payer: Self-pay | Admitting: Cardiovascular Disease

## 2021-02-28 NOTE — Telephone Encounter (Signed)
    Pt c/o of Chest Pain: STAT if CP now or developed within 24 hours  1. Are you having CP right now? No  2. Are you experiencing any other symptoms (ex. SOB, nausea, vomiting, sweating)? A little bit SOB and nausea  3. How long have you been experiencing CP? Couple of weeks ago   4. Is your CP continuous or coming and going? Coming and going   5. Have you taken Nitroglycerin? Not yet ?  Pt said he's been experiencing chest tightness since a couple of days ago, he said it wasn't bad enough to take nitroglycerin but he is concern because this is the same feeling he had when he had a heart attack. He said he does check his BP and HR and both of them are good. He wanted to see Dr. Allyson Sabal or APP this week but no appt available

## 2021-02-28 NOTE — Telephone Encounter (Signed)
Returned the call to the patient. He stated that he has been having intermittent chest pain that has progressively been getting worse. He stated that the pain is mild but does occur at rest and on exertion. He has not taken any nitroglycerin.   ED precautions have been given but he lives in [place] and does not want to go to the hospital there.   He stated that he is ready for a cardiac cath and now feels like it is needed. There are not any appointments available in the next few weeks. The patient has once again been given ED precautions.

## 2021-03-01 NOTE — Telephone Encounter (Signed)
Spoke to patient .  Patient states he not having active pain now.  RN informed patient to use NTG if chest reoccur and if 3 doses is used call  911 go to ER.  The closest  Hospital is  [place] .Patient verbalized understanding .  Will defer to Dr Allyson Sabal

## 2021-03-01 NOTE — Telephone Encounter (Signed)
    Pt is calling back to follow up  

## 2021-03-02 NOTE — Telephone Encounter (Signed)
Spoke with pt on the phone. Per Dr. Allyson Sabal, would like pt to be seen in the office in the next 1-2 weeks. Pt given appointment time for next week on Tuesday but pt states he has court that day and will be unable to make this appointment. Asked pt if he would like for me to continue to seek an appointment for him the next couple of weeks, he says he does not and that he will call back to schedule an appointment. Pt reminded of ED precautions. Pt verbalizes understanding.

## 2021-03-02 NOTE — Telephone Encounter (Signed)
Patient states he has not gotten a follow up call yet from Dr. Allyson Sabal or his Nurse

## 2021-04-13 ENCOUNTER — Ambulatory Visit: Payer: Medicare Other | Admitting: Neurology

## 2021-04-13 ENCOUNTER — Telehealth: Payer: Self-pay

## 2021-04-13 NOTE — Telephone Encounter (Signed)
   [facility] Pre-operative Risk Assessment    Patient Name: Raymond Melton  DOB: May 21, 1956  MRN: [id number]  Request for surgical clearance:  1. What type of surgery is being performed? POSSIBLY MULTIPLE   2. When is this surgery scheduled? TBD   3. What type of clearance is required (medical clearance vs. Pharmacy clearance to hold med vs. Both)? BOTH  4. Are there any medications that need to be held prior to surgery and how long? PT TAKES PLAVIX    5. Practice name and name of physician performing surgery? [facility]   6. What is the office phone number? [phone]   7.   What is the office fax number? [phone]  8.   Anesthesia type (None, local, MAC, general) ? LOCAL ANESTHETIC-W/EPI  TREATMENT MAY INCLUDE:  CLEANING, RADIOGRAPHS, FILLINGS, EXTRACTIONS.  PLEASE EVULATE FOR: ANTIBIOTIC PROPHYLAXIS, INTERRUPTION OF ANTICOAG., ANESTHETIC RESTRICTIONS, EPI OK?  PLEASE ADVISE OF ANY SPECIAL PRECAUTIONS

## 2021-04-13 NOTE — Telephone Encounter (Signed)
We do not provide blanket coverage.  Please contact requesting office for details surrounding procedure/surgery.  Once we receive details surrounding surgery we will be able to provide cardiac evaluation and recommendations.  Thank you.

## 2021-04-13 NOTE — Telephone Encounter (Signed)
I called [employer]. I was informed the dental office is closed on Friday's. I did ask for a correct phone # as the ph# placed in the clearance request is the fax #, I was give correct ph# is [phone], I was then transferred to dental office and placed into a vm.   I left vm to please fax over a new clearance request on Monday 5/23 as we do not provide blanket pre op clearance. If pt is having multiple appts and procedures we will need a clearance request faxed to our office for each appt with what procedure to be done at that time. If any questions please call [phone].

## 2021-04-16 NOTE — Telephone Encounter (Signed)
   Patient Name:  Raymond Melton  DOB:  16-Apr-1956  MRN:  [id number]   Primary Cardiologist: Nanetta Batty, MD  Chart reviewed as part of pre-operative protocol coverage.   Simple dental extractions are considered low risk procedures per guidelines and generally do not require any specific cardiac clearance. It is also generally accepted that for simple extractions and dental cleanings, there is no need to interrupt blood thinner therapy.  Recommendations 1. Proceed with simple (single) dental extraction. 2. Continue Clopidogrel (Plavix) without interruption. 3. SBE prophylaxis is not required for the patient from a cardiac standpoint.  Please call with questions.  Tereso Newcomer, PA-C 04/16/2021, 11:53 AM

## 2021-04-16 NOTE — Telephone Encounter (Signed)
Notes faxed to surgeon. This phone note will be removed from the preop pool. Tereso Newcomer, PA-C  04/16/2021 12:07 PM

## 2021-04-16 NOTE — Telephone Encounter (Signed)
S/w Dr. Uvaldo Rising, DDS office and confirmed the first procedure to be done will be for 1 tooth extraction only. I did re-state from my message left Friday that we cannot provide a blanket type of clearance and will need a new clearance for each appt with procedure to be done at each time. I did assure Dr. Uvaldo Rising, DDS office that I will send this clearance request over to the pre op team for the 1 tooth to be extracted.

## 2021-04-17 ENCOUNTER — Telehealth: Payer: Self-pay | Admitting: *Deleted

## 2021-04-17 NOTE — Telephone Encounter (Signed)
Notes faxed to surgeon. This phone note will be removed from the preop pool. Tereso Newcomer, PA-C  04/17/2021 2:18 PM

## 2021-04-17 NOTE — Telephone Encounter (Signed)
   Patient Name:  Raymond Melton  DOB:  08/22/1956  MRN:  [id number]   Primary Cardiologist: Nanetta Batty, MD  Chart reviewed as part of pre-operative protocol coverage.   Simple dental extractions/cleanings are considered low risk procedures per guidelines and generally do not require any specific cardiac clearance. It is also generally accepted that for simple extractions and dental cleanings, there is no need to interrupt blood thinner (anticoagulation or antiplatelet) therapy.  Recommendations 1. Proceed with dental cleaning. 2. Continue Clopidogrel (Plavix) without interruption. 3. SBE prophylaxis is not required for the patient from a cardiac standpoint.  Please call with questions.  Tereso Newcomer, PA-C 04/17/2021, 2:15 PM

## 2021-04-17 NOTE — Telephone Encounter (Signed)
   [facility] Pre-operative Risk Assessment    Patient Name: Raymond Melton  DOB: 02-Jul-1956  MRN: [id number]     Request for surgical clearance:  1. What type of surgery is being performed?  Deep cleaning ( 4 ) separate  Appointments are needed.  2. When is this surgery scheduled? tbd  3. What type of clearance is required (medical clearance vs. Pharmacy clearance to hold med vs. Both)? Medical   4. Are there any medications that need to be held prior to surgery and how long? Plavix 75 mg  5. Practice name and name of physician performing surgery?  [facility] ; Dr Raymond Melton  6. What is the office phone number? [phone]   7.   What is the office fax number? [phone]  8.   Anesthesia type (None, local, MAC, general) ? Local with epinephrine   Raymond Melton 04/17/2021, 1:20 PM  _________________________________________________________________   (provider comments below)

## 2021-05-01 ENCOUNTER — Other Ambulatory Visit: Payer: Self-pay

## 2021-05-01 ENCOUNTER — Ambulatory Visit: Payer: Medicare Other | Admitting: Cardiovascular Disease

## 2021-05-01 ENCOUNTER — Encounter: Payer: Self-pay | Admitting: Cardiovascular Disease

## 2021-05-01 DIAGNOSIS — I25750 Atherosclerosis of native coronary artery of transplanted heart with unstable angina: Secondary | ICD-10-CM

## 2021-05-01 DIAGNOSIS — E782 Mixed hyperlipidemia: Secondary | ICD-10-CM | POA: Diagnosis not present

## 2021-05-01 DIAGNOSIS — I10 Essential (primary) hypertension: Secondary | ICD-10-CM | POA: Diagnosis not present

## 2021-05-01 DIAGNOSIS — Z720 Tobacco use: Secondary | ICD-10-CM | POA: Diagnosis not present

## 2021-05-01 DIAGNOSIS — R079 Chest pain, unspecified: Secondary | ICD-10-CM

## 2021-05-01 NOTE — Assessment & Plan Note (Signed)
History of essential hypertension a blood pressure measured today at 128/86.  He is on amlodipine and metoprolol.

## 2021-05-01 NOTE — Patient Instructions (Signed)
Medication Instructions:  Your physician recommends that you continue on your current medications as directed. Please refer to the Current Medication list given to you today.  *If you need a refill on your cardiac medications before your next appointment, please call your pharmacy*   Testing/Procedures: Your physician has requested that you have a lexiscan myoview. For further information please visit [email or web address] Please follow instruction sheet, as given. This will take place at [address], [address]  How to prepare for your Myocardial Perfusion Test:  Do not eat or drink 3 hours prior to your test, except you may have water.  Do not consume products containing caffeine (regular or decaffeinated) 12 hours prior to your test. (ex: coffee, chocolate, sodas, tea).  Do bring a list of your current medications with you.  If not listed below, you may take your medications as normal.  Do wear comfortable clothes (no dresses or overalls) and walking shoes, tennis shoes preferred (No heels or open toe shoes are allowed).  Do NOT wear cologne, perfume, aftershave, or lotions (deodorant is allowed).  The test will take approximately 3 to 4 hours to complete  If these instructions are not followed, your test will have to be rescheduled.     Follow-Up: At [facility], you and your health needs are our priority.  As part of our continuing mission to provide you with exceptional heart care, we have created designated Provider Care Teams.  These Care Teams include your primary Cardiologist (physician) and Advanced Practice Providers (APPs -  Physician Assistants and Nurse Practitioners) who all work together to provide you with the care you need, when you need it.  We recommend signing up for the patient portal called "MyChart".  Sign up information is provided on this After Visit Summary.  MyChart is used to connect with patients for Virtual Visits (Telemedicine).  Patients are  able to view lab/test results, encounter notes, upcoming appointments, etc.  Non-urgent messages can be sent to your provider as well.   To learn more about what you can do with MyChart, go to [email or web address].    Your next appointment:   3 month(s)  The format for your next appointment:   In Person  Provider:   You will see one of the following Advanced Practice Providers on your designated Care Team:    Marjie Skiff, PA-C  Edd Fabian, FNP  Then, Nanetta Batty, MD will plan to see you again in 6 month(s).

## 2021-05-01 NOTE — Assessment & Plan Note (Signed)
History of CAD status post PCI and drug-eluting stenting of the first obtuse marginal branch by Dr. Katrinka Blazing 02/05/2018.  He did have moderate disease in his mid RCA and AV groove circumflex after the marginal takeoff.  He underwent repeat catheterization in [facility] a year after his stent which showed no evidence of significant CAD.  Because of chest pain I performed Myoview stress testing on him 04/13/2020 which was entirely normal.  He is recently had more left arm and chest pain and actually went to [facility] but left before being seen.  I am going to get a Lexiscan Myoview stress test to further evaluate.

## 2021-05-01 NOTE — Assessment & Plan Note (Signed)
History of hyperlipidemia on statin therapy with lipid profile performed 09/27/2020 revealing total cholesterol 120, LDL 41 and HDL 38.

## 2021-05-01 NOTE — Progress Notes (Signed)
05/01/2021 [facility]   Mar 01, 1956  [id number]  Primary Physician Galvin Proffer, MD Primary Cardiologist: Runell Gess MD [name], [place], [place]  HPI:  Raymond Mel[place] is a 65 y.o.   mildly overweightmarriedCaucasian male with no children who currently does not work.I last saw him in the office  12/20/2020.Marland KitchenHe is accompanied by his wife Clydie Braun today.  He has a long history of tobacco abuse smoking one half pack a day until recently when he dramatically reduce this. He does have a history of treated hypertension and diabetes. There is a strong family history of heart disease. He's never had a heart attack or stroke. He was admitted with unstable angina and underwent diagnostic coronary angiography by Dr. Katrinka Blazing on 02/05/18 revealed a high-grade obtuse marginal branch stenosis which was successfully stented using a synergy drug-eluting stent with noncritical disease otherwise and normal LV function. His chest pain has markedly improved since that time.  [facility]. He apparently was hospitalized there and underwent diagnostic cath that did not reveal disease tight enough to warrant intervention. He apparently was diagnosed with inguinal hernia andunderwent elective hernia operation after I saw him in May 2021. I had performed a Myoview stress test 04/13/2020 which was low risk and nonischemic.  He was getting fairly frequent chest pain when I saw him 6 months ago which she continues to get.  His wife did tell me that he is becoming more forgetful. I suggested that he see his primary care doctor to be referred to a neurologist for formal neuropsychiatric/cognitive testing.  He did get neuropsychiatric testing but unfortunately did not get along well with his physician.  He did stop smoking 1/2 weeks ago.  He does get some pain in his left upper extremity which sounds orthopedic or neuropathic.  Since I saw him in the office 5 months ago  he has had recurrent left arm and chest pain.  He actually went to [facility] yesterday because of chest pain but left before being seen.  The pain begins in his left arm and radiates to his chest.    Current Meds  Medication Sig  . amLODipine (NORVASC) 10 MG tablet Take 10 mg by mouth daily.  . clopidogrel (PLAVIX) 75 MG tablet Take 1 tablet (75 mg total) by mouth daily.  [name] dronabinol (MARINOL) 10 MG capsule 1 capsule  . ergocalciferol (VITAMIN D2) 1.25 MG (50000 UT) capsule 1 capsule  . metoprolol succinate (TOPROL-XL) 100 MG 24 hr tablet Take 1 tablet (100 mg total) by mouth daily. Take with or immediately following a meal: For high blood pressure  . nitroGLYCERIN (NITROSTAT) 0.4 MG SL tablet Place 1 tablet (0.4 mg total) under the tongue every 5 (five) minutes x 3 doses as needed for chest pain.  . rosuvastatin (CRESTOR) 20 MG tablet Take 1 tablet (20 mg total) by mouth daily.  . TRULICITY 0.75 MG/0.5ML SOPN Inject into the skin.  . [DISCONTINUED] albuterol (VENTOLIN HFA) 108 (90 Base) MCG/ACT inhaler Inhale into the lungs.  . [DISCONTINUED] amoxicillin-clavulanate (AUGMENTIN) 875-125 MG tablet Take 1 tablet by mouth 2 (two) times daily.  . [DISCONTINUED] ARIPiprazole (ABILIFY) 5 MG tablet Take by mouth.  . [DISCONTINUED] dronabinol (MARINOL) 10 MG capsule 1 capsule  . [DISCONTINUED] tiZANidine (ZANAFLEX) 4 MG tablet Take 4 mg by mouth 3 (three) times daily as needed.     Allergies  Allergen Reactions  . Iodinated Diagnostic Agents Anaphylaxis and Swelling  .  Iodine Anaphylaxis  . Ioxaglate Anaphylaxis and Swelling  . Metrizamide Anaphylaxis and Swelling  . Other Anaphylaxis, Shortness Of Breath and Swelling    Iv dye  . Codeine Itching and Other (See Comments)    Other reaction: restless  . Iohexol Other (See Comments)  . Latex Other (See Comments)    Unknown reaction  . Lisinopril Other (See Comments)    Unknown reaction  . Metformin Hcl Other (See Comments)  .  Metoclopramide Other (See Comments)  . Mirtazapine Other (See Comments)  . Tuberculin Other (See Comments)    Other reaction: Severe skin reaction  . Tuberculin Tests Other (See Comments)    Severe skin reaction  . Valsartan Other (See Comments)    Unknown reaction  . Zolpidem Other (See Comments)    confusion    Social History   Socioeconomic History  . Marital status: Legally Separated    Spouse name: Not on file  . Number of children: Not on file  . Years of education: Not on file  . Highest education level: Not on file  Occupational History  . Not on file  Tobacco Use  . Smoking status: Current Some Day Smoker    Packs/day: 0.50    Types: Cigarettes  . Smokeless tobacco: Never Used  Vaping Use  . Vaping Use: Some days  Substance and Sexual Activity  . Alcohol use: Yes    Comment: daily  . Drug use: No  . Sexual activity: Not Currently  Other Topics Concern  . Not on file  Social History Narrative   Right handed   Social Determinants of Health   Financial Resource Strain: Not on file  Food Insecurity: Not on file  Transportation Needs: Not on file  Physical Activity: Not on file  Stress: Not on file  Social Connections: Not on file  Intimate Partner Violence: Not on file     Review of Systems: General: negative for chills, fever, night sweats or weight changes.  Cardiovascular: negative for chest pain, dyspnea on exertion, edema, orthopnea, palpitations, paroxysmal nocturnal dyspnea or shortness of breath Dermatological: negative for rash Respiratory: negative for cough or wheezing Urologic: negative for hematuria Abdominal: negative for nausea, vomiting, diarrhea, bright red blood per rectum, melena, or hematemesis Neurologic: negative for visual changes, syncope, or dizziness All other systems reviewed and are otherwise negative except as noted above.    Blood pressure 128/86, pulse 66, height 6\' 1"  (1.854 m), weight 188 lb (85.3 kg), SpO2 96 %.   General appearance: alert and no distress Neck: no adenopathy, no carotid bruit, no JVD, supple, symmetrical, trachea midline and thyroid not enlarged, symmetric, no tenderness/mass/nodules Lungs: clear to auscultation bilaterally Heart: regular rate and rhythm, S1, S2 normal, no murmur, click, rub or gallop Extremities: extremities normal, atraumatic, no cyanosis or edema Pulses: 2+ and symmetric Skin: Skin color, texture, turgor normal. No rashes or lesions Neurologic: Alert and oriented X 3, normal strength and tone. Normal symmetric reflexes. Normal coordination and gait  EKG not performed today  ASSESSMENT AND PLAN:   Hypertension History of essential hypertension a blood pressure measured today at 128/86.  He is on amlodipine and metoprolol.  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 [facility] 02/05/2018.  He did have moderate disease in his mid RCA and AV groove circumflex after the marginal takeoff.  He underwent repeat catheterization in  [facility] a year after his stent which showed no evidence of significant CAD.  Because of chest pain I performed Myoview stress testing on him 04/13/2020 which was entirely normal.  He is recently had more left arm and chest pain and actually went to [facility] but left before being seen.  I am going to get a Lexiscan Myoview stress test to further evaluate.  Hyperlipidemia History of hyperlipidemia on statin therapy with lipid profile performed 09/27/2020 revealing total cholesterol 120, LDL 41 and HDL 38.  Tobacco abuse Discontinue tobacco abuse      Runell Gess MD [facility], [facility] 05/01/2021 3:01 PM

## 2021-05-01 NOTE — Assessment & Plan Note (Signed)
Discontinue tobacco abuse °

## 2021-05-11 ENCOUNTER — Inpatient Hospital Stay (HOSPITAL_COMMUNITY): Admission: RE | Admit: 2021-05-11 | Payer: Medicare Other | Source: Ambulatory Visit

## 2021-05-11 ENCOUNTER — Telehealth (HOSPITAL_COMMUNITY): Payer: Self-pay

## 2021-05-11 NOTE — Telephone Encounter (Signed)
Close encounter 

## 2021-05-16 ENCOUNTER — Ambulatory Visit (HOSPITAL_COMMUNITY): Payer: Medicare Other

## 2021-05-16 ENCOUNTER — Telehealth: Payer: Self-pay | Admitting: Cardiovascular Disease

## 2021-05-16 NOTE — Telephone Encounter (Signed)
Received call directly from operator for patient with questions about his lexiscan myoview scheduled for today. He is concerned that the test will not be definitive enough because he had one in the past then had a heart attack shortly thereafter. I explained the purpose of the test and advised that once Dr. Allyson Sabal reviews the results, he can discuss his symptoms in relation to the test result and the determination for additional testing can be made. I answered the patient's questions to his satisfaction and he states he will plan to have the test today. I confirmed location and arrival time and he thanked me for the assistance.

## 2021-05-16 NOTE — Telephone Encounter (Signed)
Patient called in stating he is not feeling up for his lexiscan today. Assuming he isn't feeling well physically, I was willing to cancel, but during the call he mentioned he is asymptomatic, but feeling depressed. Towards the end of the call he stated he is not sure if he'll be around to make it to his appointment. Is anyone able to speak with him regarding this? Maybe the social worker?

## 2021-05-31 ENCOUNTER — Telehealth (HOSPITAL_COMMUNITY): Payer: Self-pay | Admitting: *Deleted

## 2021-05-31 NOTE — Telephone Encounter (Signed)
Close encounter 

## 2021-06-06 ENCOUNTER — Ambulatory Visit (HOSPITAL_COMMUNITY)
Admission: RE | Admit: 2021-06-06 | Payer: Medicare Other | Source: Ambulatory Visit | Attending: Cardiovascular Disease | Admitting: Cardiovascular Disease

## 2021-06-06 ENCOUNTER — Other Ambulatory Visit: Payer: Self-pay

## 2021-06-08 ENCOUNTER — Telehealth (HOSPITAL_COMMUNITY): Payer: Self-pay | Admitting: *Deleted

## 2021-06-08 NOTE — Telephone Encounter (Signed)
Close encounter 

## 2021-06-12 ENCOUNTER — Ambulatory Visit (HOSPITAL_COMMUNITY)
Admission: RE | Admit: 2021-06-12 | Payer: Medicare Other | Source: Ambulatory Visit | Attending: Cardiovascular Disease | Admitting: Cardiovascular Disease

## 2021-06-12 ENCOUNTER — Encounter (HOSPITAL_COMMUNITY): Payer: Self-pay

## 2021-06-12 ENCOUNTER — Other Ambulatory Visit: Payer: Self-pay | Admitting: Cardiovascular Disease

## 2021-06-12 ENCOUNTER — Other Ambulatory Visit: Payer: Self-pay

## 2021-06-12 DIAGNOSIS — R079 Chest pain, unspecified: Secondary | ICD-10-CM

## 2021-06-15 ENCOUNTER — Ambulatory Visit (HOSPITAL_COMMUNITY): Payer: Medicare Other | Attending: Cardiovascular Disease

## 2021-06-19 ENCOUNTER — Encounter (HOSPITAL_COMMUNITY): Payer: Self-pay | Admitting: Cardiovascular Disease

## 2021-06-26 ENCOUNTER — Telehealth (HOSPITAL_COMMUNITY): Payer: Self-pay | Admitting: Cardiovascular Disease

## 2021-06-26 DIAGNOSIS — I200 Unstable angina: Secondary | ICD-10-CM

## 2021-06-26 NOTE — Addendum Note (Signed)
Addended by: Regis Bill B on: 06/26/2021 02:00 PM   Modules accepted: Orders

## 2021-06-26 NOTE — Telephone Encounter (Signed)
Patient called this am very upset about having to schedule Myoview at the [facility] due to amount of copay.  Patient has been scheduled at the [facility] office for numerous appts and pt states he is very hard to stick. Myoview was then ordered at the hospital due to lack of IV access per the Nuclear Imaging department.  I told patient that he would need to call the cardiology office and speak with Dr. Allyson Sabal nurse regarding changing location of test to [facility]. He states he will be willing to come to the [facility] location to have. We informed patient that we may have same IV access issues as [facility] but he was willing to take the risk.  Patient will be calling the [facility] office to speak regarding the order and the location.

## 2021-06-26 NOTE — Telephone Encounter (Signed)
Spoke with patient and ordered lexiscan myoview to be done at the [facility].  He is aware if access not obtainable he will need to be rescheduled Patient appreciated the call back and willingness to work with him secondary to cost of testing.

## 2021-07-03 ENCOUNTER — Telehealth (HOSPITAL_COMMUNITY): Payer: Self-pay | Admitting: Cardiovascular Disease

## 2021-07-03 NOTE — Telephone Encounter (Signed)
Just an FYI. We have made several attempts to contact this patient including sending a letter to schedule or reschedule their Myoview. We will be removing the patient from the Myoview WQ.   06/15/21 NO SHOWED -MAILED LETTER [facility]W  05/09/21 patient cancelled with PAm at [facility] and I called to reschedule per his request and LVM to call back @ 8:06/LBW         Thank you

## 2021-07-13 ENCOUNTER — Telehealth: Payer: Self-pay | Admitting: *Deleted

## 2021-07-13 NOTE — Telephone Encounter (Signed)
   Primary Cardiologist: Nanetta Batty, MD  Chart reviewed as part of pre-operative protocol coverage. Simple dental extractions are considered low risk procedures per guidelines and generally do not require any specific cardiac clearance. It is also generally accepted that for simple extractions and dental cleanings, there is no need to interrupt blood thinner therapy.   SBE prophylaxis is not required for the patient.  I will route this recommendation to the requesting party via Epic fax function and remove from pre-op pool.  Please call with questions.  Ronney Asters, NP 07/13/2021, 11:07 AM

## 2021-07-13 NOTE — Telephone Encounter (Signed)
   [facility] Pre-operative Risk Assessment    Patient Name: Raymond Melton  DOB: 1956-05-26 MRN: [id number]  .  Request for surgical clearance:  What type of surgery is being performed? Extraction tooth #30   When is this surgery scheduled? tbd  What type of clearance is required (medical clearance vs. Pharmacy clearance to hold med vs. Both)? medical  Are there any medications that need to be held prior to surgery and how long? plavix  Practice name and name of physician performing [facility]? [facility]; Dr Donavan Burnet  What is the office phone number? [phone]   7.   What is the office fax number?  [phone]   8.   Anesthesia type (None, local, MAC, general) ? Local and nitrous oxide   Raymond Melton 07/13/2021, 10:55 AM  _________________________________________________________________   (provider comments below)

## 2021-07-31 NOTE — Progress Notes (Deleted)
Cardiology Clinic Note   Patient Name: Raymond Melton Date of Encounter: 07/31/2021  Primary Care Provider:  Galvin Proffer, Melton Primary Cardiologist:  Raymond Batty, Mel[place]  Patient Profile    Raymond Melton presents to the clinic today for follow-up evaluation of his essential hypertension.  Past Medical History    Past Medical History:  Diagnosis Date   Alcohol abuse    Altered mental status 12/17/2017   Chronic pain    Diabetes mellitus without complication (HCC)    Hepatitis C    Hypertension    Immune deficiency disorder (HCC)    Kidney stones    TB lung, latent    Past Surgical History:  Procedure Laterality Date   ABDOMINAL SURGERY     ANKLE SURGERY     CHOLECYSTECTOMY     CORONARY STENT INTERVENTION N/A 02/05/2018   Procedure: CORONARY STENT INTERVENTION;  Surgeon: Raymond Records, Melton;  Location: [facility] INVASIVE CV LAB;  Service: Cardiovascular;  Laterality: N/A;   ELBOW SURGERY     EYE SURGERY     HERNIA REPAIR     INTRAVASCULAR PRESSURE WIRE/FFR STUDY N/A 02/05/2018   Procedure: INTRAVASCULAR PRESSURE WIRE/FFR STUDY;  Surgeon: Raymond Records, Melton;  Location: [facility] INVASIVE CV LAB;  Service: Cardiovascular;  Laterality: N/A;   LEFT HEART CATH AND CORONARY ANGIOGRAPHY N/A 02/05/2018   Procedure: LEFT HEART CATH AND CORONARY ANGIOGRAPHY;  Surgeon: Raymond Records, Melton;  Location: [facility] INVASIVE CV LAB;  Service: Cardiovascular;  Laterality: N/A;   WRIST SURGERY      Allergies  Allergies  Allergen Reactions   Iodinated Diagnostic Agents Anaphylaxis and Swelling   Iodine Anaphylaxis   Ioxaglate Anaphylaxis and Swelling   Metrizamide Anaphylaxis and Swelling   Other Anaphylaxis, Shortness Of Breath and Swelling    Iv dye   Codeine Itching and Other (See Comments)    Other reaction: restless   Iohexol Other (See Comments)   Latex Other (See Comments)    Unknown reaction   Lisinopril Other (See Comments)    Unknown reaction   Metformin Hcl Other (See Comments)    Metoclopramide Other (See Comments)   Mirtazapine Other (See Comments)   Tuberculin Other (See Comments)    Other reaction: Severe skin reaction   Tuberculin Tests Other (See Comments)    Severe skin reaction   Valsartan Other (See Comments)    Unknown reaction   Zolpidem Other (See Comments)    confusion    History of Present Illness    Raymond Melton has a PMH of HTN, CAD, HLD, tobacco abuse, and chest discomfort.  He has a strong family history of heart disease.  He continues to smoke about half pack per day during his last cardiology visit.  He was previously admitted for unstable angina and underwent diagnostic coronary angiography by Dr. Katrinka Melton 3/19.  It showed high-grade obtuse marginal branch stenosis which was treated with PCI and DES.  He was noted to have noncritical disease in his other coronary anatomy and normal LV function.  He moved to [place] for a year.  He was hospitalized there and underwent diagnostic cardiac catheterization which showed coronary disease which was not bad enough to receive PCI.  He was also diagnosed with inguinal hernia and underwent elective hernia operation 5/21.  He underwent nuclear stress test 04/13/2020 which showed low risk and no ischemia.  He has been known to have frequent chest discomfort which has been treated medically.  He has  also been having trouble with his memory.  It was recommended that he follow-up with his PCP for referral to neurology and cognitive testing.  He followed up with neuropsychiatric testing.  However, he did not get along with the provider.  He stopped smoking.  He continued to have some pain in his left upper extremity which was felt to be related to orthopedic/neuropathic issues.  He was last seen by Raymond Melton on 05/01/2021.  During that time he continued to have recurrent left arm and chest discomfort.  He [facility] to his visit but left prior to being seen.   He reported that his pain started in his left arm and radiated to his chest.  A nuclear stress test was ordered but was not completed.  He presents to the clinic today for follow-up evaluation states***  *** denies chest pain, shortness of breath, lower extremity edema, fatigue, palpitations, melena, hematuria, hemoptysis, diaphoresis, weakness, presyncope, syncope, orthopnea, and PND.   Home Medications    Prior to Admission medications   Medication Sig Start Date End Date Taking? Authorizing Provider  amLODipine (NORVASC) 10 MG tablet Take 10 mg by mouth daily. 11/27/18   Provider, Historical, Melton  clopidogrel (PLAVIX) 75 MG tablet Take 1 tablet (75 mg total) by mouth daily. 03/31/18   Raymond Mel[place], [place]  dronabinol (MARINOL) 10 MG capsule 1 capsule 10/03/20   Provider, Historical, Melton  ergocalciferol (VITAMIN D2) 1.25 MG (50000 UT) capsule 1 capsule 02/05/21   Provider, Historical, Melton  metoprolol succinate (TOPROL-XL) 100 MG 24 hr tablet Take 1 tablet (100 mg total) by mouth daily. Take with or immediately following a meal: For high blood pressure 12/26/17   Raymond Mel[place]  nitroGLYCERIN (NITROSTAT) 0.4 MG SL tablet Place 1 tablet (0.4 mg total) under the tongue every 5 (five) minutes x 3 doses as needed for chest pain. 02/06/18   Raymond Melton, Lawrence, Melton  rosuvastatin (CRESTOR) 20 MG tablet Take 1 tablet (20 mg total) by mouth daily. 02/06/18   Raymond Melton, Lawrence, Melton  TRULICITY 0.75 MG/0.5ML SOPN Inject into the skin. 12/18/20   Provider, Historical, Melton    Family History    Family History  Problem Relation Age of Onset   Depression Mother    Hypertension Mother    Leukemia Mother    Stroke Father    Hypertension Father    Heart attack Father    Cancer Sister    Hypertension Sister    Cancer Brother        bladder   Hypertension Brother    Heart attack Brother    He indicated that his mother is deceased. He indicated that his father is deceased. He indicated that his sister is  alive. He indicated that his brother is alive.  Social History    Social History   Socioeconomic History   Marital status: Legally Separated    Spouse name: Not on file   Number of children: Not on file   Years of education: Not on file   Highest education level: Not on file  Occupational History   Not on file  Tobacco Use   Smoking status: Some Days    Packs/day: 0.50    Types: Cigarettes   Smokeless tobacco: Never  Vaping Use   Vaping Use: Some days  Substance and Sexual Activity   Alcohol use: Yes    Comment: daily   Drug use: No   Sexual activity: Not  Currently  Other Topics Concern   Not on file  Social History Narrative   Right handed   Social Determinants of Health   Financial Resource Strain: Not on file  Food Insecurity: Not on file  Transportation Needs: Not on file  Physical Activity: Not on file  Stress: Not on file  Social Connections: Not on file  Intimate Partner Violence: Not on file     Review of Systems    General:  No chills, fever, night sweats or weight changes.  Cardiovascular:  No chest pain, dyspnea on exertion, edema, orthopnea, palpitations, paroxysmal nocturnal dyspnea. Dermatological: No rash, lesions/masses Respiratory: No cough, dyspnea Urologic: No hematuria, dysuria Abdominal:   No nausea, vomiting, diarrhea, bright red blood per rectum, melena, or hematemesis Neurologic:  No visual changes, wkns, changes in mental status. All other systems reviewed and are otherwise negative except as noted above.  Physical Exam    VS:  There were no vitals taken for this visit. , BMI There is no height or weight on file to calculate BMI. GEN: Well nourished, well developed, in no acute distress. HEENT: normal. Neck: Supple, no JVD, carotid bruits, or masses. Cardiac: RRR, no murmurs, rubs, or gallops. No clubbing, cyanosis, edema.  Radials/DP/PT 2+ and equal bilaterally.  Respiratory:  Respirations regular and unlabored, clear to  auscultation bilaterally. GI: Soft, nontender, nondistended, BS + x 4. MS: no deformity or atrophy. Skin: warm and dry, no rash. Neuro:  Strength and sensation are intact. Psych: Normal affect.  Accessory Clinical Findings    Recent Labs: 09/27/2020: ALT 18   Recent Lipid Panel    Component Value Date/Time   CHOL 120 09/27/2020 1157   TRIG 264 (H) 09/27/2020 1157   HDL 38 (L) 09/27/2020 1157   CHOLHDL 3.2 09/27/2020 1157   LDLCALC 41 09/27/2020 1157    ECG personally reviewed by me today- *** - No acute changes  Echocardiogram 04/07/2020 IMPRESSIONS     1. Left ventricular ejection fraction, by estimation, is 60 to 65%. Left  ventricular ejection fraction by PLAX is 62 %. The left ventricle has  normal function. The left ventricle has no regional wall motion  abnormalities. Left ventricular diastolic  parameters are consistent with Grade I diastolic dysfunction (impaired  relaxation).   2. Right ventricular systolic function is normal. The right ventricular  size is normal.   3. The mitral valve is normal in structure. Trivial mitral valve  regurgitation. No evidence of mitral stenosis.   4. The aortic valve is tricuspid. Aortic valve regurgitation is not  visualized. No aortic stenosis is present.   5. The inferior vena cava is normal in size with greater than 50%  respiratory variability, suggesting right atrial pressure of 3 mmHg.   Nuclear stress test 04/13/2020 Nuclear stress EF: 59%. There was no ST segment deviation noted during stress. The study is normal. The left ventricular ejection fraction is normal (55-65%). This is a low risk study. Increased lung uptake in R lung fields, unclear significance. Consider CXR/CT chest to further evaluate if clinically indicated.  Assessment & Plan   1.  Essential hypertension-BP today***.  Well-controlled at home. Continue amlodipine, metoprolol Heart healthy low-sodium diet-salty 6 given Increase physical activity as  tolerated  Coronary artery disease-continues with chronic left arm and chest discomfort.  Underwent cardiac catheterization with PCI to his first obtuse marginal by Dr. Katrinka Melton 3/19.  He was noted to have disease in his mid RCA and AV groove circumflex after the marginal.  Underwent  repeat cardiac catheterization in [facility] which noted no evidence of significant CAD and medical management was recommended.  Had nuclear stress test 5/21 which was normal.  During last clinic visit he was noted to have left arm pain and chest discomfort.  Myoview stress test was ordered but not completed. Reorder Myoview stress test  Hyperlipidemia-09/27/2020: Cholesterol, Total 120; HDL 38; LDL Chol Calc (NIH) 41; Triglycerides 264 Continue*** Heart healthy low-sodium high-fiber diet.   Increase physical activity as tolerated  Tobacco abuse-continues and is smoking cessation. Congratulated on cessation  Disposition: Follow-up with Dr. Allyson Sabal after stress testing.   Thomasene Ripple. Farley Crooker Melton-C    07/31/2021, 7:34 AM [facility] [address] Office [phone] Fax [phone]  Notice: This dictation was prepared with Dragon dictation along with smaller phrase technology. Any transcriptional errors that result from this process are unintentional and may not be corrected upon review.  I spent***minutes examining this patient, reviewing medications, and using patient centered shared decision making involving her cardiac care.  Prior to her visit I spent greater than 20 minutes reviewing her past medical history,  medications, and prior cardiac tests.

## 2021-08-01 ENCOUNTER — Ambulatory Visit: Payer: Medicare Other | Admitting: General Practice

## 2021-08-22 ENCOUNTER — Telehealth: Payer: Self-pay

## 2021-08-22 NOTE — Telephone Encounter (Signed)
   Patient Name: Ojani Berenson  DOB: 04-May-1956 MRN: [id number]  Primary Cardiologist: Nanetta Batty, MD  Chart reviewed as part of pre-operative protocol coverage.   Simple dental extractions are considered low risk procedures per guidelines and generally do not require any specific cardiac clearance. It is also generally accepted that for simple extractions and dental cleanings, there is no need to interrupt blood thinner therapy.  SBE prophylaxis is not required for the patient from a cardiac standpoint.  I will route this recommendation to the requesting party via Epic fax function and remove from pre-op pool.  Please call with questions.  Beatriz Stallion, PA-C 08/22/2021, 12:44 PM

## 2021-08-22 NOTE — Telephone Encounter (Signed)
   [facility] Pre-operative Risk Assessment    Patient Name: Crew Goren  DOB: 1956/04/20 MRN: [id number]  HEARTCARE STAFF:  - IMPORTANT!!!!!! Under Visit Info/Reason for Call, type in Other and utilize the format Clearance MM/DD/YY or Clearance TBD. Do not use dashes or single digits. - Please review there is not already an duplicate clearance open for this procedure. - If request is for dental extraction, please clarify the # of teeth to be extracted. - If the patient is currently at the dentist's office, call Pre-Op Callback Staff (MA/nurse) to input urgent request.  - If the patient is not currently in the dentist office, please route to the Pre-Op pool.  Request for surgical clearance:  What type of surgery is being performed? Deep Cleaning (With Cavitron)  When is this surgery scheduled? 08/27/21- to be completed in 4 separate appointments  What type of clearance is required (medical clearance vs. Pharmacy clearance to hold med vs. Both)? BOTH  Are there any medications that need to be held prior to surgery and how long? Plavix  Practice name and name of physician performing surgery? Dr. Leonides Schanz   What is the office phone number? [phone]   5.   What is the office fax number? [phone]  8.   Anesthesia type (None, local, MAC, general) ? Not listed    Eliezer Lofts B Shakendra Griffeth 08/22/2021, 12:30 PM  _________________________________________________________________   (provider comments below)

## 2021-10-09 ENCOUNTER — Encounter: Payer: Self-pay | Admitting: Internal Medicine

## 2021-10-29 ENCOUNTER — Telehealth: Payer: Self-pay

## 2021-10-29 ENCOUNTER — Encounter: Payer: Self-pay | Admitting: Internal Medicine

## 2021-10-29 ENCOUNTER — Ambulatory Visit (INDEPENDENT_AMBULATORY_CARE_PROVIDER_SITE_OTHER): Payer: Medicare Other | Admitting: Internal Medicine

## 2021-10-29 DIAGNOSIS — R070 Pain in throat: Secondary | ICD-10-CM | POA: Diagnosis not present

## 2021-10-29 DIAGNOSIS — Z9889 Other specified postprocedural states: Secondary | ICD-10-CM

## 2021-10-29 DIAGNOSIS — R0789 Other chest pain: Secondary | ICD-10-CM

## 2021-10-29 DIAGNOSIS — K219 Gastro-esophageal reflux disease without esophagitis: Secondary | ICD-10-CM

## 2021-10-29 NOTE — Progress Notes (Signed)
Chief Complaint: Chest pain, throat pain  HPI : 65 year old with history of GERD s/p fundoplication, HFpEF, Sjogren's syndrome, DM, CVA on Plavix, CAD sp PCI, HCV s/p Harvoni treatment, latent TB, tobacco use, and alcohol use presents with chest pain and throat pain  He has been having issues with chest pressure and throat pain that have worsening over the last couple of weeks. Also was having some choking sensation. He went to his PCP who told him that he has had throat ulcers. He used to have throat ulcers in the past when he followed with Dr. Victorino Dike. Patient has been to urgent care recently on 07/14/21 and was found to have a negative cardiac evaluation with neg ECG. About 6 months ago, he was able to belch again, but prior to that he was not able to do so due to his prior fundoplication with hiatal hernia repair that was performed sometime in the 1990s with Dr. Jacquenette Shone. Denies dysphagia. Endorses a mild chest burning with occasional regurgitation. Denies N&V, ab pain, melena, weight loss. Last colonoscopy was in 3-4 years ago that was normal, which was done at [facility]. Last EGD was done about 10 years ago that showed that his wrap was intact at that time. He takes occasional Tums for GERD.  Wt Readings from Last 3 Encounters:  10/29/21 194 lb 4 oz (88.1 kg)  05/01/21 188 lb (85.3 kg)  12/20/20 190 lb 9.6 oz (86.5 kg)    Past Medical History:  Diagnosis Date   Alcohol abuse    Altered mental status 12/17/2017   Chronic pain    Diabetes mellitus without complication (HCC)    Hepatitis C    Hypertension    Immune deficiency disorder (HCC)    Kidney stones    TB lung, latent     Past Surgical History:  Procedure Laterality Date   ABDOMINAL SURGERY     ANKLE SURGERY     CHOLECYSTECTOMY     CORONARY STENT INTERVENTION N/A 02/05/2018   Procedure: CORONARY STENT INTERVENTION;  Surgeon: Lyn Records, MD;  [facility];  Service: Cardiovascular;  Laterality:  N/A;   ELBOW SURGERY     EYE SURGERY     HERNIA REPAIR     INTRAVASCULAR PRESSURE WIRE/FFR STUDY N/A 02/05/2018   Procedure: INTRAVASCULAR PRESSURE WIRE/FFR STUDY;  Surgeon: Lyn Records, MD;  Location: [facility];  Service: Cardiovascular;  Laterality: N/A;   LEFT HEART CATH AND CORONARY ANGIOGRAPHY N/A 02/05/2018   Procedure: LEFT HEART CATH AND CORONARY ANGIOGRAPHY;  Surgeon: Lyn Records, MD;  Location: [facility];  Service: Cardiovascular;  Laterality: N/A;   WRIST SURGERY     Family History  Problem Relation Age of Onset   Depression Mother    Hypertension Mother    Leukemia Mother    Colon polyps Mother    Stroke Father    Hypertension Father    Heart attack Father    Cancer Sister    Hypertension Sister    Cancer Brother        bladder   Hypertension Brother    Heart attack Brother    Social History   Tobacco Use   Smoking status: Some Days    Packs/day: 0.50    Types: Cigarettes   Smokeless tobacco: Never  Vaping Use   Vaping Use: Some days  Substance Use Topics   Alcohol use: Yes    Comment: daily   Drug use:  No   Current Outpatient Medications  Medication Sig Dispense Refill   amLODipine (NORVASC) 10 MG tablet Take 10 mg by mouth daily.     clopidogrel (PLAVIX) 75 MG tablet Take 1 tablet (75 mg total) by mouth daily. 90 tablet 3   dronabinol (MARINOL) 10 MG capsule 1 capsule     ergocalciferol (VITAMIN D2) 1.25 MG (50000 UT) capsule 1 capsule     metoprolol succinate (TOPROL-XL) 100 MG 24 hr tablet Take 1 tablet (100 mg total) by mouth daily. Take with or immediately following a meal: For high blood pressure     nitroGLYCERIN (NITROSTAT) 0.4 MG SL tablet Place 1 tablet (0.4 mg total) under the tongue every 5 (five) minutes x 3 doses as needed for chest pain. 30 tablet 12   rosuvastatin (CRESTOR) 20 MG tablet Take 1 tablet (20 mg total) by mouth daily. 90 tablet 3   TRULICITY 0.75 MG/0.5ML SOPN Inject into the skin.     No current  facility-administered medications for this visit.   Allergies  Allergen Reactions   Iodinated Diagnostic Agents Anaphylaxis and Swelling   Iodine Anaphylaxis   Ioxaglate Anaphylaxis and Swelling   Metrizamide Anaphylaxis and Swelling   Other Anaphylaxis, Shortness Of Breath and Swelling    Iv dye   Codeine Itching and Other (See Comments)    Other reaction: restless   Iohexol Other (See Comments)   Latex Other (See Comments)    Unknown reaction   Lisinopril Other (See Comments)    Unknown reaction   Metformin Hcl Other (See Comments)   Metoclopramide Other (See Comments)   Mirtazapine Other (See Comments)   Tuberculin Other (See Comments)    Other reaction: Severe skin reaction   Tuberculin Tests Other (See Comments)    Severe skin reaction   Valsartan Other (See Comments)    Unknown reaction   Zolpidem Other (See Comments)    confusion     Review of Systems: All systems reviewed and negative except where noted in HPI.   Physical Exam: Ht 6\' 1"  (1.854 m)   Wt 194 lb 4 oz (88.1 kg)   BMI 25.63 kg/m  Constitutional: Pleasant,well-developed, male in no acute distress. HEENT: Normocephalic and atraumatic. Conjunctivae are normal. No scleral icterus. Cardiovascular: Normal rate, regular rhythm.  Pulmonary/chest: Effort normal and breath sounds normal. No wheezing, rales or rhonchi. Abdominal: Soft, nondistended, nontender, tympanic. Bowel sounds active throughout. Extremities: No edema Neurological: Mildly dysarthria. Left eyelid droop. Skin: Skin is warm and dry. No rashes noted. Psychiatric: Normal mood and affect. Behavior is normal.  Labs 06/2021: CBC, CMP, lipase unremarkable. Trop neg.  KUB 07/14/21: FINDINGS:  Surgical clips are seen in the RIGHT UPPER QUADRANT. There is  gaseous distension of the stomach. Nondilated large and small bowel  loops. There is moderate stool burden. No evidence for free  intraperitoneal air on the supine views. Visualized osseous   structures have a normal appearance.  ASSESSMENT AND PLAN:  Throat pain Chest discomfort GERD History of fundoplication Patient presents with throat pain and chest discomfort over the last few weeks, which feel similar to the symptoms he was experiencing prior to his fundoplication procedure. Patient might be experiencing GERD, esophagitis, or esophageal ulcer. Will proceed with EGD for further evaluation. During his procedure, will also plan to evaluate whether or not his fundoplication wrap is intact. Patient has been to urgent care recently and was found to have a negative cardiac evaluation neg ECG. Will also plan to reach out to his  cardiologist prior to get cardiac clearance for his EGD procedure. - EGD [facility]. Will hold plavix for 5 days beforehand so will discuss with cardiologist Dr. Allyson Sabal to see if he would be okay with this. It appears that Dr. Allyson Sabal also wanted to get a cardiac stress test on him so will also plan to get cardiac clearance. - Patient plans to continue to get his colonoscopies for polyp surveillance/colon cancer screening with [facility]  [name], MD

## 2021-10-29 NOTE — Telephone Encounter (Signed)
[facility] Pre-operative Risk Assessment     Request for surgical clearance:     Endoscopy Procedure  What type of surgery is being performed?     EGD  When is this surgery scheduled?     11-09-21  What type of clearance is required ?   Pharmacy  Are there any medications that need to be held prior to surgery and how long? Plavix x 5 days  Practice name and name of physician performing surgery?      [facility]  What is your office phone and fax number?      Phone- [phone]  Fax716 [phone]  Anesthesia type (None, local, MAC, general) ?       MAC

## 2021-10-29 NOTE — Patient Instructions (Signed)
If you are age 66 or older, your body mass index should be between 23-30. Your Body mass index is 25.63 kg/m. If this is out of the aforementioned range listed, please consider follow up with your Primary Care Provider. ________________________________________________________  The [facility] providers would like to encourage you to use [facility] to communicate with providers for non-urgent requests or questions.  Due to long hold times on the telephone, sending your provider a message by [facility] may be a faster and more efficient way to get a response.  Please allow 48 business hours for a response.  Please remember that this is for non-urgent requests.  _______________________________________________________  Raymond Melton have been scheduled for an endoscopy. Please follow written instructions given to you at your visit today. If you use inhalers (even only as needed), please bring them with you on the day of your procedure.  Due to recent changes in healthcare laws, you may see the results of your imaging and laboratory studies on MyChart before your provider has had a chance to review them.  We understand that in some cases there may be results that are confusing or concerning to you. Not all laboratory results come back in the same time frame and the provider may be waiting for multiple results in order to interpret others.  Please give [place] 48 hours in order for your provider to thoroughly review all the results before contacting the office for clarification of your results.   Thank you for entrusting me with your care and choosing [facility].  Dr Leonides Schanz

## 2021-10-30 NOTE — Telephone Encounter (Signed)
Dr. Allyson Sabal Pt had PCI with DES to OM1 in 2019 and two nuclear stress tests since that time that were nonischemic. Can he hold plavix for EGD?

## 2021-10-31 NOTE — Telephone Encounter (Signed)
   Name: Raymond Melton  DOB: 02/04/1956  MRN: [id number]   Primary Cardiologist: Nanetta Batty, MD  Chart reviewed as part of pre-operative protocol coverage.  We are asked for guidance to hold plavix for EGD. Pt has a history of DES to OM1 in 2019, 2 nuclear stress tests since that time were nonischemic.   Per Dr. Allyson Sabal: may hold plavix prior to procedure.    I will route this recommendation to the requesting party via Epic fax function and remove from pre-op pool. Please call with questions.  Roe Rutherford Andru Genter, PA 10/31/2021, 3:52 PM

## 2021-10-31 NOTE — Telephone Encounter (Signed)
Attempted to reach patient by phone to make him aware that he could hold Plavix x 5 days prior to procedure.  There was no answer and no voicemail.  Will continue efforts.

## 2021-11-02 NOTE — Telephone Encounter (Signed)
Attempted to reach patient by phone to make him aware that he could hold Plavix x 5 days prior to procedure.  There was no answer and no voicemail.  Will continue efforts.

## 2021-11-02 NOTE — Telephone Encounter (Signed)
Patient advised per Blondell Reveal, CMA OK to hold Plavix 5 days prior to procedure scheduled on 11/09/21.   Patient advised to take last dose of Plavix on 11/03/21, and would be advised when to restart by Dr. Leonides Schanz.  Patient verbalized that he understood.

## 2021-11-09 ENCOUNTER — Encounter: Payer: Self-pay | Admitting: Internal Medicine

## 2021-11-09 ENCOUNTER — Other Ambulatory Visit: Payer: Self-pay

## 2021-11-09 ENCOUNTER — Ambulatory Visit (AMBULATORY_SURGERY_CENTER): Payer: Medicare Other | Admitting: Internal Medicine

## 2021-11-09 VITALS — BP 114/79 | HR 75 | Temp 98.2°F | Resp 12 | Ht 73.0 in | Wt 194.0 lb

## 2021-11-09 DIAGNOSIS — K219 Gastro-esophageal reflux disease without esophagitis: Secondary | ICD-10-CM

## 2021-11-09 DIAGNOSIS — R0789 Other chest pain: Secondary | ICD-10-CM

## 2021-11-09 DIAGNOSIS — K2990 Gastroduodenitis, unspecified, without bleeding: Secondary | ICD-10-CM

## 2021-11-09 DIAGNOSIS — K2100 Gastro-esophageal reflux disease with esophagitis, without bleeding: Secondary | ICD-10-CM

## 2021-11-09 DIAGNOSIS — R070 Pain in throat: Secondary | ICD-10-CM | POA: Diagnosis not present

## 2021-11-09 DIAGNOSIS — K2950 Unspecified chronic gastritis without bleeding: Secondary | ICD-10-CM | POA: Diagnosis not present

## 2021-11-09 DIAGNOSIS — K2970 Gastritis, unspecified, without bleeding: Secondary | ICD-10-CM | POA: Diagnosis not present

## 2021-11-09 MED ORDER — OMEPRAZOLE 40 MG PO CPDR: 40.0000 mg | DELAYED_RELEASE_CAPSULE | Freq: Two times a day (BID) | ORAL | 3 refills | Status: DC

## 2021-11-09 MED ORDER — SODIUM CHLORIDE 0.9 % IV SOLN: 500.0000 mL | Freq: Once | INTRAVENOUS | Status: DC

## 2021-11-09 NOTE — Progress Notes (Signed)
GASTROENTEROLOGY PROCEDURE H&P NOTE   Primary Care Physician: Galvin Proffer, MD    Reason for Procedure:   Throat pain  Plan:    EGD  Patient is appropriate for endoscopic procedure(s) in the ambulatory ([facility]) setting.  The nature of the procedure, as well as the risks, benefits, and alternatives were carefully and thoroughly reviewed with the patient. Ample time for discussion and questions allowed. The patient understood, was satisfied, and agreed to proceed.     HPI: Raymond Melton is a 65 y.o. male who presents for EGD for evaluation of throat pain .  Patient was most recently seen in the Gastroenterology Clinic on 10/29/21.  No interval change in medical history since that appointment. Please refer to that note for full details regarding GI history and clinical presentation.   Past Medical History:  Diagnosis Date   Alcohol abuse    Altered mental status 12/17/2017   Chronic pain    Diabetes mellitus without complication (HCC)    Hepatitis C    Hypertension    Immune deficiency disorder (HCC)    Kidney stones    TB lung, latent     Past Surgical History:  Procedure Laterality Date   ABDOMINAL SURGERY     ANKLE SURGERY     CHOLECYSTECTOMY     CORONARY STENT INTERVENTION N/A 02/05/2018   Procedure: CORONARY STENT INTERVENTION;  Surgeon: Lyn Records, MD;  Location: [facility];  Service: Cardiovascular;  Laterality: N/A;   ELBOW SURGERY     EYE SURGERY     HERNIA REPAIR     INTRAVASCULAR PRESSURE WIRE/FFR STUDY N/A 02/05/2018   Procedure: INTRAVASCULAR PRESSURE WIRE/FFR STUDY;  Surgeon: Lyn Records, MD;  Location: [facility];  Service: Cardiovascular;  Laterality: N/A;   LEFT HEART CATH AND CORONARY ANGIOGRAPHY N/A 02/05/2018   Procedure: LEFT HEART CATH AND CORONARY ANGIOGRAPHY;  Surgeon: Lyn Records, MD;  Location: [facility];  Service: Cardiovascular;  Laterality: N/A;   WRIST SURGERY      Prior to Admission medications    Medication Sig Start Date End Date Taking? Authorizing Provider  amLODipine (NORVASC) 10 MG tablet Take 10 mg by mouth daily. 11/27/18   [provider]  clopidogrel (PLAVIX) 75 MG tablet Take 1 tablet (75 mg total) by mouth daily. 03/31/18   Runell Gess, MD  dronabinol (MARINOL) 10 MG capsule 1 capsule 10/03/20   [provider]  ergocalciferol (VITAMIN D2) 1.25 MG (50000 UT) capsule 1 capsule 02/05/21   [provider]  metoprolol succinate (TOPROL-XL) 100 MG 24 hr tablet Take 1 tablet (100 mg total) by mouth daily. Take with or immediately following a meal: For high blood pressure 12/26/17   Nwoko, Nicole Kindred I, NP  nitroGLYCERIN (NITROSTAT) 0.4 MG SL tablet Place 1 tablet (0.4 mg total) under the tongue every 5 (five) minutes x 3 doses as needed for chest pain. 02/06/18   Lanelle Bal, MD  rosuvastatin (CRESTOR) 20 MG tablet Take 1 tablet (20 mg total) by mouth daily. 02/06/18   Lanelle Bal, MD  TRULICITY 0.75 MG/0.5ML SOPN Inject into the skin. 12/18/20   [provider]    Current Outpatient Medications  Medication Sig Dispense Refill   amLODipine (NORVASC) 10 MG tablet Take 10 mg by mouth daily.     clopidogrel (PLAVIX) 75 MG tablet Take 1 tablet (75 mg total) by mouth daily. 90 tablet 3   dronabinol (MARINOL) 10 MG capsule 1 capsule  ergocalciferol (VITAMIN D2) 1.25 MG (50000 UT) capsule 1 capsule     metoprolol succinate (TOPROL-XL) 100 MG 24 hr tablet Take 1 tablet (100 mg total) by mouth daily. Take with or immediately following a meal: For high blood pressure     nitroGLYCERIN (NITROSTAT) 0.4 MG SL tablet Place 1 tablet (0.4 mg total) under the tongue every 5 (five) minutes x 3 doses as needed for chest pain. 30 tablet 12   rosuvastatin (CRESTOR) 20 MG tablet Take 1 tablet (20 mg total) by mouth daily. 90 tablet 3   TRULICITY 0.75 MG/0.5ML SOPN Inject into the skin.     Current Facility-Administered Medications  Medication Dose Route  Frequency Provider Last Rate Last Admin   0.9 %  sodium chloride infusion  500 mL Intravenous Once Imogene Burn, MD        Allergies as of 11/09/2021 - Review Complete 10/29/2021  Allergen Reaction Noted   Iodinated diagnostic agents Anaphylaxis and Swelling 04/05/2013   Iodine Anaphylaxis 07/18/2018   Ioxaglate Anaphylaxis and Swelling 02/09/2015   Metrizamide Anaphylaxis and Swelling 02/15/2013   Other Anaphylaxis, Shortness Of Breath, and Swelling 04/05/2013   Codeine Itching and Other (See Comments) 02/09/2015   Iohexol Other (See Comments) 04/07/2013   Latex Other (See Comments) 11/25/2016   Lisinopril Other (See Comments) 11/25/2016   Metformin hcl Other (See Comments) 10/30/2020   Metoclopramide Other (See Comments) 10/30/2020   Mirtazapine Other (See Comments) 10/30/2020   Tuberculin Other (See Comments) 02/09/2015   Tuberculin tests Other (See Comments)    Valsartan Other (See Comments) 11/25/2016   Zolpidem Other (See Comments) 10/31/2014    Family History  Problem Relation Age of Onset   Depression Mother    Hypertension Mother    Leukemia Mother    Colon polyps Mother    Stroke Father    Hypertension Father    Heart attack Father    Cancer Sister    Hypertension Sister    Cancer Brother        bladder   Hypertension Brother    Heart attack Brother     Social History   Socioeconomic History   Marital status: Legally Separated    Spouse name: Not on file   Number of children: Not on file   Years of education: Not on file   Highest education level: Not on file  Occupational History   Not on file  Tobacco Use   Smoking status: Some Days    Packs/day: 0.50    Types: Cigarettes   Smokeless tobacco: Never  Vaping Use   Vaping Use: Some days  Substance and Sexual Activity   Alcohol use: Yes    Comment: daily   Drug use: No   Sexual activity: Not Currently  Other Topics Concern   Not on file  Social History Narrative   Right handed   Social  Determinants of Health   Financial Resource Strain: Not on file  Food Insecurity: Not on file  Transportation Needs: Not on file  Physical Activity: Not on file  Stress: Not on file  Social Connections: Not on file  Intimate Partner Violence: Not on file    Physical Exam: Vital signs in last 24 hours: BP (!) 93/57    Pulse 87    Temp 98.2 F (36.8 C)    Ht 6\' 1"  (1.854 m)    Wt 194 lb (88 kg)    SpO2 98%    BMI 25.60 kg/m  GEN: NAD EYE: Sclerae  anicteric ENT: MMM CV: Non-tachycardic Pulm: No increased WOB GI: Soft NEURO:  Alert & Oriented   [name], MD [facility]   11/09/2021 9:27 AM

## 2021-11-09 NOTE — Patient Instructions (Addendum)
Handout given for gastritis.  RESUME PLAVIX TOMORROW (Saturday 11/10/21 ) AT USUAL DOSE.  RESUME ALL OTHER MEDICATIONS TODAY.  PICK UP RX FOR PRILOSEC 40 MG TWICE DAILY FOR 8 WEEKS THEN ONCE DAILY THEREAFTER.   YOU HAD AN ENDOSCOPIC PROCEDURE TODAY AT THE [facility]:   Refer to the procedure report that was given to you for any specific questions about what was found during the examination.  If the procedure report does not answer your questions, please call your gastroenterologist to clarify.  If you requested that your care partner not be given the details of your procedure findings, then the procedure report has been included in a sealed envelope for you to review at your convenience later.  YOU SHOULD EXPECT: Some feelings of bloating in the abdomen. Passage of more gas than usual.  Walking can help get rid of the air that was put into your GI tract during the procedure and reduce the bloating. If you had a lower endoscopy (such as a colonoscopy or flexible sigmoidoscopy) you may notice spotting of blood in your stool or on the toilet paper. If you underwent a bowel prep for your procedure, you may not have a normal bowel movement for a few days.  Please Note:  You might notice some irritation and congestion in your nose or some drainage.  This is from the oxygen used during your procedure.  There is no need for concern and it should clear up in a day or so.  SYMPTOMS TO REPORT IMMEDIATELY:  Following upper endoscopy (EGD)  Vomiting of blood or coffee ground material  New chest pain or pain under the shoulder blades  Painful or persistently difficult swallowing  New shortness of breath  Fever of 100F or higher  Black, tarry-looking stools  For urgent or emergent issues, a gastroenterologist can be reached at any hour by calling [phone]. Do not use MyChart messaging for urgent concerns.    DIET:  We do recommend a small meal at first, but then you may proceed to  your regular diet.  Drink plenty of fluids but you should avoid alcoholic beverages for 24 hours.  ACTIVITY:  You should plan to take it easy for the rest of today and you should NOT DRIVE or use heavy machinery until tomorrow (because of the sedation medicines used during the test).    FOLLOW UP: Our staff will call the number listed on your records 48-72 hours following your procedure to check on you and address any questions or concerns that you may have regarding the information given to you following your procedure. If we do not reach you, we will leave a message.  We will attempt to reach you two times.  During this call, we will ask if you have developed any symptoms of COVID 19. If you develop any symptoms (ie: fever, flu-like symptoms, shortness of breath, cough etc.) before then, please call [phone].  If you test positive for Covid 19 in the 2 weeks post procedure, please call and report this information to [place].    If any biopsies were taken you will be contacted by phone or by letter within the next 1-3 weeks.  Please call us at [phone] if you have not heard about the biopsies in 3 weeks.    SIGNATURES/CONFIDENTIALITY: You and/or your care partner have signed paperwork which will be entered into your electronic medical record.  These signatures attest to the fact that that the information above on your After  Visit Summary has been reviewed and is understood.  Full responsibility of the confidentiality of this discharge information lies with you and/or your care-partner.

## 2021-11-09 NOTE — Op Note (Signed)
[facility] Patient Name: Raymond Melton Procedure Date: 11/09/2021 10:01 AM MRN: [id number] Endoscopist: Nicole Kindred "Raymond Melton ,  Age: 65 Referring MD:  Date of Birth: 07-06-1956 Gender: Male Account #: [id number] Procedure:                Upper GI endoscopy Indications:              Heartburn, Assessment following Nissen                            fundoplication, Sore throat Medicines:                Monitored Anesthesia Care Procedure:                Pre-Anesthesia Assessment:                           - Prior to the procedure, a History and Physical                            was performed, and patient medications and                            allergies were reviewed. The patient's tolerance of                            previous anesthesia was also reviewed. The risks                            and benefits of the procedure and the sedation                            options and risks were discussed with the patient.                            All questions were answered, and informed consent                            was obtained. Prior Anticoagulants: The patient has                            taken Plavix (clopidogrel), last dose was 7 days                            prior to procedure. ASA Grade Assessment: III - A                            patient with severe systemic disease. After                            reviewing the risks and benefits, the patient was                            deemed in satisfactory condition to undergo the  procedure.                           After obtaining informed consent, the endoscope was                            passed under direct vision. Throughout the                            procedure, the patient's blood pressure, pulse, and                            oxygen saturations were monitored continuously. The                            Endoscope was introduced through the mouth, and                             advanced to the second part of duodenum. The upper                            GI endoscopy was accomplished without difficulty.                            The patient tolerated the procedure well. Scope In: 10:06:05 AM Scope Out: 10:13:36 AM Total Procedure Duration: 0 hours 7 minutes 31 seconds  Findings:                 LA Grade A (one or more mucosal breaks less than 5                            mm, not extending between tops of 2 mucosal folds)                            esophagitis with no bleeding was found at the                            gastroesophageal junction.                           Evidence of a fundoplication was found in the                            cardia. The wrap appeared loose. This was traversed.                           Localized inflammation characterized by congestion                            (edema), erosions and erythema was found in the                            gastric body. Biopsies were taken with a cold  forceps for Helicobacter pylori testing.                           The examined duodenum was normal. Complications:            No immediate complications. Estimated Blood Loss:     Estimated blood loss was minimal. Impression:               - LA Grade A esophagitis with no bleeding.                           - A fundoplication was found. The wrap appears                            loose.                           - Gastritis. Biopsied.                           - Normal examined duodenum. Recommendation:           - Discharge patient to home (with escort).                           - It is suspected that your throat pain is due to                            acid reflux.                           - Await pathology results.                           - Use Prilosec (omeprazole) 40 mg PO BID for 8                            weeks, then reduce to 40 mg QD.                           - Return to GI clinic in 2 months.                            - The findings and recommendations were discussed                            with the patient. [name] "Raymond Melton,  11/09/2021 10:22:52 AM

## 2021-11-09 NOTE — Progress Notes (Signed)
Vs CW  Pt's states no medical or surgical changes since previsit or office visit.  

## 2021-11-09 NOTE — Progress Notes (Signed)
Called to room to assist during endoscopic procedure.  Patient ID and intended procedure confirmed with present staff. Received instructions for my participation in the procedure from the performing physician.  

## 2021-11-13 ENCOUNTER — Encounter: Payer: Self-pay | Admitting: Internal Medicine

## 2021-11-13 ENCOUNTER — Telehealth: Payer: Self-pay

## 2021-11-13 NOTE — Telephone Encounter (Signed)
°  Follow up Call-  Call back number 11/09/2021  Post procedure Call Back phone  # [phone]  Permission to leave phone message Yes  Some recent data might be hidden     Patient questions:  Do you have a fever, pain , or abdominal swelling? No. Pain Score  0 *  Have you tolerated food without any problems? Yes.    Have you been able to return to your normal activities? Yes.    Do you have any questions about your discharge instructions: Diet   No. Medications  Yes.  P. Reports he is in a quandry as to what to do re: his general aches and pains that he usually takes NSAIDs for.  Pt. Says he was told not to take there post-procedure, due to his esophagitis and gastritis.  Pt. Says he can't take Tylenol because of liver disease, and doesn't know what to do.  Told pt. I would forward his question to Dr. Leonides Schanz, and to contact his liver specialist in [facility], to see if they had any suggestions as how to proceed as well. Follow up visit  No.  Do you have questions or concerns about your Care? Yes.    Actions: * If pain score is 4 or above: Physician/ provider Notified : Y. [name], MD.

## 2022-01-08 ENCOUNTER — Ambulatory Visit (INDEPENDENT_AMBULATORY_CARE_PROVIDER_SITE_OTHER): Payer: Medicare Other | Admitting: Internal Medicine

## 2022-01-08 ENCOUNTER — Encounter: Payer: Self-pay | Admitting: Internal Medicine

## 2022-01-08 VITALS — BP 140/72 | HR 65 | Ht 73.0 in | Wt 196.0 lb

## 2022-01-08 DIAGNOSIS — K219 Gastro-esophageal reflux disease without esophagitis: Secondary | ICD-10-CM | POA: Diagnosis not present

## 2022-01-08 DIAGNOSIS — Z9889 Other specified postprocedural states: Secondary | ICD-10-CM | POA: Diagnosis not present

## 2022-01-08 MED ORDER — OMEPRAZOLE 40 MG PO CPDR: 40.0000 mg | DELAYED_RELEASE_CAPSULE | Freq: Every day | ORAL | 3 refills | Status: DC

## 2022-01-08 NOTE — Progress Notes (Signed)
Chief Complaint: Chest pain, throat pain  HPI : 66 year old with history of GERD s/p fundoplication, HFpEF, Sjogren's syndrome, DM, CVA on Plavix, CAD sp PCI, HCV s/p Harvoni treatment, latent TB, tobacco use, and alcohol use presents with chest pain and throat pain  Interval History: Patient has continued to have intermittent issues with chest pressure and throat pain.  However he does feel like his symptoms are improved since he last saw me in clinic.  He has been trying to take his omeprazole as directed, but recently moved apartments and thus as a result he has stopped taking his omeprazole medication.  He has been still sleeping at an incline to try and help with his symptoms.  Current Outpatient Medications  Medication Sig Dispense Refill   amLODipine (NORVASC) 10 MG tablet Take 10 mg by mouth daily.     clopidogrel (PLAVIX) 75 MG tablet Take 1 tablet (75 mg total) by mouth daily. 90 tablet 3   dronabinol (MARINOL) 10 MG capsule 1 capsule     Dulaglutide (TRULICITY) 1.5 0000000 SOPN 0.5 ml     ergocalciferol (VITAMIN D2) 1.25 MG (50000 UT) capsule 1 capsule     metoprolol succinate (TOPROL-XL) 100 MG 24 hr tablet Take 1 tablet (100 mg total) by mouth daily. Take with or immediately following a meal: For high blood pressure     nitroGLYCERIN (NITROSTAT) 0.4 MG SL tablet Place 1 tablet (0.4 mg total) under the tongue every 5 (five) minutes x 3 doses as needed for chest pain. 30 tablet 12   omeprazole (PRILOSEC) 40 MG capsule Take 1 capsule (40 mg total) by mouth 2 (two) times daily before a meal. 40 mg twice daily for 8 weeks then 40 mg once daily thereafter. 90 capsule 3   rosuvastatin (CRESTOR) 20 MG tablet Take 1 tablet (20 mg total) by mouth daily. 90 tablet 3   tiZANidine (ZANAFLEX) 4 MG tablet Take 4 mg by mouth 3 (three) times daily as needed.     TRULICITY A999333 0000000 SOPN Inject into the skin.     No current facility-administered medications for this visit.   Review of  Systems: All systems reviewed and negative except where noted in HPI.   Physical Exam: BP 140/72    Pulse 65    Ht 6\' 1"  (1.854 m)    Wt 196 lb (88.9 kg)    BMI 25.86 kg/m  Constitutional: Pleasant,well-developed, male in no acute distress. HEENT: Normocephalic and atraumatic. Conjunctivae are normal. No scleral icterus. Cardiovascular: Normal rate, regular rhythm.  Pulmonary/chest: Effort normal and breath sounds normal. No wheezing, rales or rhonchi. Abdominal: Soft, nondistended, nontender, tympanic. Bowel sounds active throughout. Extremities: No edema Neurological: Mildly dysarthria. Left eyelid droop. Skin: Skin is warm and dry. No rashes noted. Psychiatric: Normal mood and affect. Behavior is normal.  Labs 06/2021: CBC, CMP, lipase unremarkable. Trop neg.  KUB 07/14/21: FINDINGS:  Surgical clips are seen in the [place]. There is  gaseous distension of the stomach. Nondilated large and small bowel  loops. There is moderate stool burden. No evidence for free  intraperitoneal air on the supine views. Visualized osseous  structures have a normal appearance.  EGD 11/09/21: - LA Grade A esophagitis with no bleeding. - A fundoplication was found. The wrap appears loose. - Gastritis. Biopsied. - Normal examined duodenum. Path: Surgical [P], random gastric biopsy - MILD CHRONIC GASTRITIS WITHOUT ACTIVITY - NO H. PYLORI OR INTESTINAL METAPLASIA IDENTIFIED  ASSESSMENT AND PLAN:  Throat pain  Chest discomfort GERD History of fundoplication History of esophagitis and gastritis Patient has been continued to experience some symptoms of throat discomfort and chest discomfort that is likely due to uncontrolled reflux symptoms.  His last EGD in 10/2001 showed that he does have a loose wrap and some signs of esophagitis/gastritis.  He has been inconsistent about taking his omeprazole therapy so I will decrease his omeprazole to 40 mg once a day to see if he is able to better  tolerate this medication.  He would like to avoid a revision to his fundoplication if possible.  I am in agreement that we should try medical therapies first. - Omeprazole 40 mg QD, take 30 min before eating - Gave GERD handout - Patient plans to continue to get his colonoscopies for polyp surveillance/colon cancer screening with [facility] in 6 months  [name], MD

## 2022-01-08 NOTE — Patient Instructions (Signed)
If you are age 67 or older, your body mass index should be between 23-30. Your Body mass index is 25.86 kg/m. If this is out of the aforementioned range listed, please consider follow up with your Primary Care Provider.  If you are age 61 or younger, your body mass index should be between 19-25. Your Body mass index is 25.86 kg/m. If this is out of the aformentioned range listed, please consider follow up with your Primary Care Provider.   We have sent the following medications to your pharmacy for you to pick up at your convenience: Omeprazole 40 mg 30 minutes before eating.  The [place] [facility] providers would like to encourage you to use [facility] to communicate with providers for non-urgent requests or questions.  Due to long hold times on the telephone, sending your provider a message by [facility] may be a faster and more efficient way to get a response.  Please allow 48 business hours for a response.  Please remember that this is for non-urgent requests.   It was a pleasure to see you today!  Thank you for trusting me with your gastrointestinal care!    [name], MD

## 2022-02-14 ENCOUNTER — Telehealth: Payer: Self-pay | Admitting: Internal Medicine

## 2022-02-14 NOTE — Telephone Encounter (Signed)
LOV 01/08/22 with Dr. Leonides Schanz for throat pain, GERD and chest discomfort. Per Dr. Leonides Schanz, pt was advised to take Omeprazole qd 30 minutes prior to eating and follow GERD lifestyle changes. Returned pt call to inquire further about his symptoms. States he has been taking Omeprazole as Rx'd and following GERD handout. Confirmed he noticed improvements to his symptoms until a few weeks ago. States he his having worsening throat pain and chest discomfort and is occurring all day. Denies dyspnea, fatigue, dizziness, pain radiating down extremities, pain/discomfort/tightness to neck, jaw or back, feeling anxious or palpitations. Denies taking any other OTC medications to help relieve his symptoms. Advised Dr. Leonides Schanz is out of the office and will not return until Monday. Pt has been scheduled for OV with Hyacinth Meeker, PA 02/15/22 @ 130pm. Advised he proceed to the ED if his symptoms worsen or if he develops any dyspnea, fatigue, dizziness, pain radiating down extremities, pain/discomfort/tightness to neck, jaw or back, feeling anxious or palpitations. Verbalized acceptance and understanding. ?

## 2022-02-14 NOTE — Telephone Encounter (Signed)
Inbound call from patient seeking advice if there is anyway he can get a GI cocktail for GERD. Stated that it has helped him in the past. Please advise.  ?

## 2022-02-15 ENCOUNTER — Ambulatory Visit: Payer: Medicare Other | Admitting: Physician Assistant

## 2022-02-15 ENCOUNTER — Telehealth: Payer: Self-pay | Admitting: Physician Assistant

## 2022-02-15 ENCOUNTER — Encounter: Payer: Self-pay | Admitting: Physician Assistant

## 2022-02-15 VITALS — BP 108/66 | HR 68 | Ht 73.0 in | Wt 191.0 lb

## 2022-02-15 DIAGNOSIS — Z9889 Other specified postprocedural states: Secondary | ICD-10-CM

## 2022-02-15 DIAGNOSIS — R1319 Other dysphagia: Secondary | ICD-10-CM

## 2022-02-15 DIAGNOSIS — R070 Pain in throat: Secondary | ICD-10-CM

## 2022-02-15 DIAGNOSIS — R0789 Other chest pain: Secondary | ICD-10-CM

## 2022-02-15 DIAGNOSIS — K219 Gastro-esophageal reflux disease without esophagitis: Secondary | ICD-10-CM

## 2022-02-15 MED ORDER — AMBULATORY NON FORMULARY MEDICATION: 1 refills | Status: DC

## 2022-02-15 NOTE — Telephone Encounter (Signed)
Script faxed to pharmacy

## 2022-02-15 NOTE — Patient Instructions (Signed)
We have sent the following medications to your pharmacy for you to pick up at your convenience: ?GI Cocktail 5-10 ml every 4-6 hours as needed. ? ?Continue Omeprazole 40 mg daily 30-60 minutes before breakfast. ? ?If you are age 67 or older, your body mass index should be between 23-30. Your Body mass index is 25.2 kg/m?[name] If this is out of the aforementioned range listed, please consider follow up with your Primary Care Provider. ? ?If you are age 3 or younger, your body mass index should be between 19-25. Your Body mass index is 25.2 kg/m?[name] If this is out of the aformentioned range listed, please consider follow up with your Primary Care Provider.  ? ?________________________________________________________ ? ?The [place] [facility] providers would like to encourage you to use [facility] to communicate with providers for non-urgent requests or questions.  Due to long hold times on the telephone, sending your provider a message by [facility] may be a faster and more efficient way to get a response.  Please allow 48 business hours for a response.  Please remember that this is for non-urgent requests.  ?_______________________________________________________ ? ?

## 2022-02-15 NOTE — Telephone Encounter (Signed)
Patient went by pharmacy to pick up the GI cocktail, but the pharmacy said they had not received a prescription request.  He was told that it may be the end of the day or between patients before it could be called in especially if you're working with a full clinic this afternoon.  If there are any issues, please call patient and advise.  Thank you. ?

## 2022-02-15 NOTE — Progress Notes (Signed)
? ?Chief Complaint: GERD/throat pain ? ?HPI: ?   Raymond Melton is a 66 year old male, known to Dr. Lorenso Courier, with a past medical history of reflux status post fundoplication, heart failure, Sjogren's syndrome, diabetes, CVA on Plavix, CAD status post PCI, HCV status post Harvoni treatment, tobacco use and alcohol use, who presents to clinic today with a complaint of ongoing throat pain. ?   11/09/2021 EGD with LA grade a esophagitis with no bleeding, fundoplication with loose wrap, gastritis and normal duodenum.  Biopsy showed mild chronic gastritis without activity. ?   01/08/2022 patient followed with Dr. Lorenso Courier for chest pain/throat pain at that time continued with intermittent issues.  Apparently not been taking his Omeprazole as directed.  At that time his Omeprazole was decreased to 40 mg once a day to see if he could better tolerate the medicine. ?   Today, the patient tells me that he has continued with some issues though decreased seemingly over the past week while using his Omeprazole 40 mg once a day.  Today he describes more of a problem in his throat describing that when he swallows he sometimes feels like his saliva gets hung up and sometimes it is uncomfortable.  Also has occasional chest pains.  Apparently seen at [facility] and they thought possibly it was his heart but "everything checked out", apparently had a scan of his lungs which he told me showed a "growth", but then he saw his PCP and they told him it was fine.  Patient is not sure if his ongoing symptoms are related to his heart or the GI system. ?   Denies fever, chills or weight loss. ? ?Past Medical History:  ?Diagnosis Date  ? Alcohol abuse   ? Altered mental status 12/17/2017  ? Chronic pain   ? Diabetes mellitus without complication ([place])   ? Hepatitis C   ? Hypertension   ? Immune deficiency disorder ([place])   ? Kidney stones   ? TB lung, latent   ? ? ?Past Surgical History:  ?Procedure Laterality Date  ? ABDOMINAL SURGERY    ? ANKLE  SURGERY    ? CHOLECYSTECTOMY    ? CORONARY STENT INTERVENTION N/A 02/05/2018  ? Procedure: CORONARY STENT INTERVENTION;  Surgeon: Belva Crome, MD;  Location: [place] [facility];  Service: Cardiovascular;  Laterality: N/A;  ? ELBOW SURGERY    ? EYE SURGERY    ? HERNIA REPAIR    ? INTRAVASCULAR PRESSURE WIRE/FFR STUDY N/A 02/05/2018  ? Procedure: INTRAVASCULAR PRESSURE WIRE/FFR STUDY;  Surgeon: Belva Crome, MD;  Location: [place] [facility];  Service: Cardiovascular;  Laterality: N/A;  ? LEFT HEART CATH AND CORONARY ANGIOGRAPHY N/A 02/05/2018  ? Procedure: LEFT HEART CATH AND CORONARY ANGIOGRAPHY;  Surgeon: Belva Crome, MD;  Location: [place] [facility];  Service: Cardiovascular;  Laterality: N/A;  ? WRIST SURGERY    ? ? ?Current Outpatient Medications  ?Medication Sig Dispense Refill  ? amLODipine (NORVASC) 10 MG tablet Take 10 mg by mouth daily.    ? clopidogrel (PLAVIX) 75 MG tablet Take 1 tablet (75 mg total) by mouth daily. 90 tablet 3  ? dronabinol (MARINOL) 10 MG capsule 1 capsule    ? Dulaglutide (TRULICITY) 1.5 0000000 SOPN 0.5 ml    ? ergocalciferol (VITAMIN D2) 1.25 MG (50000 UT) capsule 1 capsule    ? metoprolol succinate (TOPROL-XL) 100 MG 24 hr tablet Take 1 tablet (100 mg total) by mouth daily. Take with or immediately following  a meal: For high blood pressure    ? nitroGLYCERIN (NITROSTAT) 0.4 MG SL tablet Place 1 tablet (0.4 mg total) under the tongue every 5 (five) minutes x 3 doses as needed for chest pain. 30 tablet 12  ? omeprazole (PRILOSEC) 40 MG capsule Take 1 capsule (40 mg total) by mouth daily. Take one capsule 30 minutes before Breakfast. 90 capsule 3  ? rosuvastatin (CRESTOR) 20 MG tablet Take 1 tablet (20 mg total) by mouth daily. 90 tablet 3  ? tiZANidine (ZANAFLEX) 4 MG tablet Take 4 mg by mouth 3 (three) times daily as needed.    ? TRULICITY A999333 0000000 SOPN Inject into the skin.    ? ?No current facility-administered medications for this visit.  ? ? ?Allergies as of  02/15/2022 - Review Complete 01/08/2022  ?Allergen Reaction Noted  ? Iodinated contrast media Anaphylaxis and Swelling 04/05/2013  ? Iodine Anaphylaxis 07/18/2018  ? Ioxaglate Anaphylaxis and Swelling 02/09/2015  ? Metrizamide Anaphylaxis and Swelling 02/15/2013  ? Other Anaphylaxis, Shortness Of Breath, and Swelling 04/05/2013  ? Codeine Itching and Other (See Comments) 02/09/2015  ? Iohexol Other (See Comments) 04/07/2013  ? Latex Other (See Comments) 11/25/2016  ? Lisinopril Other (See Comments) 11/25/2016  ? Metformin hcl Other (See Comments) and Diarrhea 10/30/2020  ? Metoclopramide Other (See Comments) 10/30/2020  ? Mirtazapine Other (See Comments) 10/30/2020  ? Tuberculin tests Other (See Comments)   ? Tuberculin, ppd Other (See Comments) 02/09/2015  ? Valsartan Other (See Comments) 11/25/2016  ? Zolpidem Other (See Comments) 10/31/2014  ? ? ?Family History  ?Problem Relation Age of Onset  ? Depression Mother   ? Hypertension Mother   ? Leukemia Mother   ? Colon polyps Mother   ? Stroke Father   ? Hypertension Father   ? Heart attack Father   ? Cancer Sister   ? Hypertension Sister   ? Cancer Brother   ?     bladder  ? Hypertension Brother   ? Heart attack Brother   ? ? ?Social History  ? ?Socioeconomic History  ? Marital status: Legally Separated  ?  Spouse name: Not on file  ? Number of children: Not on file  ? Years of education: Not on file  ? Highest education level: Not on file  ?Occupational History  ? Not on file  ?Tobacco Use  ? Smoking status: Former  ?  Packs/day: 0.50  ?  Types: Cigarettes  ?  Quit date: 09/2021  ?  Years since quitting: 0.3  ? Smokeless tobacco: Never  ?Vaping Use  ? Vaping Use: Some days  ?Substance and Sexual Activity  ? Alcohol use: Yes  ?  Comment: daily  ? Drug use: No  ? Sexual activity: Not Currently  ?Other Topics Concern  ? Not on file  ?Social History Narrative  ? Right handed  ? ?Social Determinants of Health  ? ?Financial Resource Strain: Not on file  ?Food  Insecurity: Not on file  ?Transportation Needs: Not on file  ?Physical Activity: Not on file  ?Stress: Not on file  ?Social Connections: Not on file  ?Intimate Partner Violence: Not on file  ? ? ?Review of Systems:    ?Constitutional: No weight loss, fever or chills ?Cardiovascular: No chest pressure or palpitations   ?Respiratory: No SOB  ?Gastrointestinal: See HPI and otherwise negative ? ? Physical Exam:  ?Vital signs: ?BP 108/66   Pulse 68   Ht 6\' 1"  (1.854 m)   Wt 191 lb (86.6 kg)  BMI 25.20 kg/m?   ? ?Constitutional:   Pleasant Caucasian male appears to be in NAD, Well developed, Well nourished, alert and cooperative ?Respiratory: Respirations even and unlabored. Lungs clear to auscultation bilaterally.   No wheezes, crackles, or rhonchi.  ?Cardiovascular: Normal S1, S2. No MRG. Regular rate and rhythm. No peripheral edema, cyanosis or pallor.  ?Gastrointestinal:  Soft, nondistended, nontender. No rebound or guarding. Normal bowel sounds. No appreciable masses or hepatomegaly. ?Rectal:  Not performed.  ?Psychiatric: Oriented to person, place and time. Demonstrates good judgement and reason without abnormal affect or behaviors. ? ?No recent labs ? ?Assessment: ?1.  GERD/dysphagia/throat pain/chest pain: Ongoing, slightly decreased since using Omeprazole 40 mg daily; likely ongoing reflux symptoms +/- dysmotility issues ? ?Plan: ?1.  Today it sounds more like a dysphagia issue where patient feels like his saliva is not really going down correctly and feels like it is getting hung up, though he seems to eat ok.  Wonder if he has dysmotility.  If this continues then would recommend a barium esophagram with tablet for further evaluation.  Discussed this with the patient today. ?2.  For now patient requested a GI cocktail, prescribed 5-10 mL every 4-6 hours as needed.  Discussed with patient that he can try this and if it helps with his pain then likely his symptoms are reflux related, if not then he should  probably follow-up with his cardiologist.  He verbalized understanding. ?3.  Patient to continue Omeprazole 40 mg daily ?4.  Patient to follow in clinic as needed. ? ?Ellouise Newer, PA-C ?[facility] ?02/15/2022, 1

## 2022-02-17 NOTE — Progress Notes (Signed)
I agree with the assessment and plan as outlined by Ms. Lemmon. 

## 2022-05-13 DIAGNOSIS — R5383 Other fatigue: Secondary | ICD-10-CM | POA: Insufficient documentation

## 2022-05-16 ENCOUNTER — Telehealth: Payer: Self-pay

## 2022-05-16 DIAGNOSIS — Z01818 Encounter for other preprocedural examination: Secondary | ICD-10-CM | POA: Diagnosis not present

## 2022-05-16 NOTE — Telephone Encounter (Signed)
   Pre-operative Risk Assessment    Patient Name: Raymond Melton  DOB: [birth year] MRN: [id number]      Request for Surgical Clearance    Procedure:   Laminectomy and Disectomy  Date of Surgery:  Clearance TBD                                 Surgeon:  Garnette Gunner, MD Surgeon's Group or Practice Name:  [facility] Phone number:  [phone] Fax number:  [phone]   Type of Clearance Requested:   - Medical    Type of Anesthesia:  General    Additional requests/questions:  Please advise surgeon/provider what medications should be held.  Signed, Irena Cords Keithen Capo   05/16/2022, 10:51 AM

## 2022-05-16 NOTE — Telephone Encounter (Signed)
   Name: Raymond Melton  DOB: [birth year]  MRN: [id number]  Primary Cardiologist: Nanetta Batty, MD  Chart reviewed as part of pre-operative protocol coverage. Because of [name]'s past medical history and time since last visit, he will require a follow-up in-office visit in order to better assess preoperative cardiovascular risk.  Plavix previously addressed by Dr. Allyson Sabal.  Pre-op covering staff: - Please schedule appointment and call patient to inform them. If patient already had an upcoming appointment within acceptable timeframe, please add "pre-op clearance" to the appointment notes so provider is aware. - Please contact requesting surgeon's office via preferred method (i.e, phone, fax) to inform them of need for appointment prior to surgery.   Roe Rutherford Tasneem Cormier, PA  05/16/2022, 1:41 PM

## 2022-05-16 NOTE — Telephone Encounter (Signed)
I left a message for the patient to return my call to schedule an appointment for pre-op clearance.   

## 2022-05-21 ENCOUNTER — Ambulatory Visit: Payer: Medicare Other | Admitting: Cardiovascular Disease

## 2022-05-21 ENCOUNTER — Encounter: Payer: Self-pay | Admitting: Cardiovascular Disease

## 2022-05-21 DIAGNOSIS — I10 Essential (primary) hypertension: Secondary | ICD-10-CM | POA: Diagnosis not present

## 2022-05-21 DIAGNOSIS — E782 Mixed hyperlipidemia: Secondary | ICD-10-CM

## 2022-05-21 DIAGNOSIS — I25750 Atherosclerosis of native coronary artery of transplanted heart with unstable angina: Secondary | ICD-10-CM

## 2022-05-21 NOTE — Progress Notes (Signed)
05/21/2022 [facility]   Jan 01, 1956  [id number]  Primary Physician Galvin Proffer, MD Primary Cardiologist: Runell Gess MD [name], [place], [place]  HPI:  Raymond Melton is a 66 y.o.  mildly overweight married Caucasian male with no children who currently does not work.  I last saw him in the office 05/01/2021.[name]  He is accompanied by his wife Raymond Melton today.  He has a long history of tobacco abuse smoking one half pack a day until recently when he dramatically reduce this. He does have a history of treated hypertension and diabetes. There is a strong family history of heart disease. He's never had a heart attack or stroke. He was admitted with unstable angina and underwent diagnostic coronary angiography by Dr. Katrinka Blazing on 02/05/18 revealed a high-grade obtuse marginal branch stenosis which was successfully stented using a synergy drug-eluting stent with noncritical disease otherwise and normal LV function. His chest pain has markedly improved since that time.    He had moved to [facility] for a year.  He apparently was hospitalized there and underwent diagnostic cath that did not reveal disease tight enough to warrant intervention.  He apparently was diagnosed with inguinal hernia and underwent elective hernia operation after I saw him in May 2021.  I had performed a Myoview stress test 04/13/2020 which was low risk and nonischemic.    He was getting fairly frequent chest pain when I saw him 6 months ago which she continues to get.   His wife did tell me that he is becoming more forgetful.  I suggested that he see his primary care doctor to be referred to a neurologist for formal neuropsychiatric/cognitive testing.   He did get neuropsychiatric testing but unfortunately did not get along well with his physician.  He did stop smoking 1/2 weeks ago.  He does get some pain in his left upper extremity which sounds orthopedic or neuropathic.   Since I saw him in the office a year ago he  continues to be stable.  He gets occasional sporadic chest pain which has not changed in frequency or severity.  I apparently had ordered a stress test last him a year ago but they could not get an IV and this was never done.  He apparently also needs back surgery which I will clear him for at low risk.   Current Meds  Medication Sig   AMBULATORY NON FORMULARY MEDICATION Medication Name:90 ml 2% Lidocaine/90 ml Dicyclomine 10 mg/33ml/270 ml Maalox - Take 5-10 ml every 4-6 hours as needed.   amLODipine (NORVASC) 10 MG tablet Take 10 mg by mouth daily.   clopidogrel (PLAVIX) 75 MG tablet Take 1 tablet (75 mg total) by mouth daily.   dronabinol (MARINOL) 10 MG capsule 1 capsule   Dulaglutide (TRULICITY) 1.5 MG/0.5ML SOPN 0.5 ml   ergocalciferol (VITAMIN D2) 1.25 MG (50000 UT) capsule 1 capsule   metoprolol succinate (TOPROL-XL) 100 MG 24 hr tablet Take 1 tablet (100 mg total) by mouth daily. Take with or immediately following a meal: For high blood pressure   nitroGLYCERIN (NITROSTAT) 0.4 MG SL tablet Place 1 tablet (0.4 mg total) under the tongue every 5 (five) minutes x 3 doses as needed for chest pain.   omeprazole (PRILOSEC) 40 MG capsule Take 1 capsule (40 mg total) by mouth daily. Take one capsule 30 minutes before Breakfast.   rosuvastatin (CRESTOR) 20 MG tablet Take 1 tablet (20 mg total) by mouth daily.   tiZANidine (  ZANAFLEX) 4 MG tablet Take 4 mg by mouth 3 (three) times daily as needed.   TRULICITY 0.75 MG/0.5ML SOPN Inject into the skin.     Allergies  Allergen Reactions   Iodinated Contrast Media Anaphylaxis and Swelling   Iodine Anaphylaxis   Ioxaglate Anaphylaxis and Swelling   Metrizamide Anaphylaxis and Swelling   Other Anaphylaxis, Shortness Of Breath and Swelling    Iv dye   Codeine Itching and Other (See Comments)    Other reaction: restless   Iohexol Other (See Comments)   Latex Other (See Comments)    Unknown reaction   Lisinopril Other (See Comments)    Unknown  reaction   Metformin Hcl Other (See Comments) and Diarrhea   Metoclopramide Other (See Comments)   Mirtazapine Other (See Comments)   Tuberculin Tests Other (See Comments)    Severe skin reaction   Tuberculin, Ppd Other (See Comments)    Other reaction: Severe skin reaction   Valsartan Other (See Comments)    Unknown reaction   Zolpidem Other (See Comments)    confusion    Social History   Socioeconomic History   Marital status: Legally Separated    Spouse name: Not on file   Number of children: Not on file   Years of education: Not on file   Highest education level: Not on file  Occupational History   Not on file  Tobacco Use   Smoking status: Former    Packs/day: 0.50    Types: Cigarettes    Quit date: 09/2021    Years since quitting: 0.6   Smokeless tobacco: Never  Vaping Use   Vaping Use: Some days  Substance and Sexual Activity   Alcohol use: Yes    Comment: daily   Drug use: No   Sexual activity: Not Currently  Other Topics Concern   Not on file  Social History Narrative   Right handed   Social Determinants of Health   Financial Resource Strain: Not on file  Food Insecurity: Not on file  Transportation Needs: Not on file  Physical Activity: Not on file  Stress: Not on file  Social Connections: Not on file  Intimate Partner Violence: Not on file     Review of Systems: General: negative for chills, fever, night sweats or weight changes.  Cardiovascular: negative for chest pain, dyspnea on exertion, edema, orthopnea, palpitations, paroxysmal nocturnal dyspnea or shortness of breath Dermatological: negative for rash Respiratory: negative for cough or wheezing Urologic: negative for hematuria Abdominal: negative for nausea, vomiting, diarrhea, bright red blood per rectum, melena, or hematemesis Neurologic: negative for visual changes, syncope, or dizziness All other systems reviewed and are otherwise negative except as noted above.    Blood pressure  120/72, pulse 78, height 6\' 1"  (1.854 m), weight 188 lb (85.3 kg).  General appearance: alert and no distress Neck: no adenopathy, no carotid bruit, no JVD, supple, symmetrical, trachea midline, and thyroid not enlarged, symmetric, no tenderness/mass/nodules Lungs: clear to auscultation bilaterally Heart: regular rate and rhythm, S1, S2 normal, no murmur, click, rub or gallop Extremities: extremities normal, atraumatic, no cyanosis or edema Pulses: 2+ and symmetric Skin: Skin color, texture, turgor normal. No rashes or lesions Neurologic: Grossly normal  EKG not performed today  ASSESSMENT AND PLAN:   Hypertension History of essential hypertension blood pressure measured today at 120/72.  He is on amlodipine, and metoprolol.  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 Katrinka Blazing 02/05/2018 using a Synergy drug-eluting stent with noncritical disease otherwise normal LV function.  He was recatheterized in [facility] a year later without significant disease.  I performed Myoview stress testing 04/13/2020 which was low risk and nonischemic.  He continues to have atypical chest pain which is fairly sporadic and unchanged in frequency or severity.  Hyperlipidemia History of hyperlipidemia on statin therapy followed by his PCP.     Runell Gess MD FACP,FACC,FAHA, [facility] 05/21/2022 4:01 PM

## 2022-05-21 NOTE — Assessment & Plan Note (Signed)
History of essential hypertension blood pressure measured today at 120/72.  He is on amlodipine, and metoprolol.

## 2022-05-21 NOTE — Assessment & Plan Note (Signed)
History of hyperlipidemia on statin therapy followed by his PCP 

## 2022-06-28 ENCOUNTER — Ambulatory Visit: Payer: Medicare Other | Admitting: Cardiovascular Disease

## 2022-09-03 DIAGNOSIS — R413 Other amnesia: Secondary | ICD-10-CM | POA: Insufficient documentation

## 2022-10-15 ENCOUNTER — Ambulatory Visit: Payer: Medicare Other | Admitting: Podiatry

## 2022-10-15 DIAGNOSIS — M79675 Pain in left toe(s): Secondary | ICD-10-CM

## 2022-10-15 DIAGNOSIS — M79674 Pain in right toe(s): Secondary | ICD-10-CM

## 2022-10-15 DIAGNOSIS — E1142 Type 2 diabetes mellitus with diabetic polyneuropathy: Secondary | ICD-10-CM

## 2022-10-15 DIAGNOSIS — B351 Tinea unguium: Secondary | ICD-10-CM | POA: Diagnosis not present

## 2022-10-15 NOTE — Progress Notes (Signed)
  Subjective:  Patient ID: Raymond Melton, male    DOB: Apr 18, 1956,  MRN: [id number]  Chief Complaint  Patient presents with   Nail Problem    Diabetic Foot Care     66 y.o. male presents with the above complaint. History confirmed with patient. Patient presenting with pain related to dystrophic thickened elongated nails. Patient is unable to trim own nails related to nail dystrophy and/or mobility issues. Patient does  have a history of T2DM.  Objective:  Physical Exam: warm, good capillary refill nail exam onychomycosis of the toenails, onycholysis, and dystrophic nails DP pulses palpable, PT pulses palpable, and protective sensation intact Left Foot:  Pain with palpation of nails due to elongation and dystrophic growth.  Right Foot: Pain with palpation of nails due to elongation and dystrophic growth.   Assessment:   1. Pain due to onychomycosis of toenails of both feet   2. DM type 2 with diabetic peripheral neuropathy (HCC)      Plan:  Patient was evaluated and treated and all questions answered.  #Onychomycosis with pain  -Nails palliatively debrided as below. -Educated on self-care  Procedure: Nail Debridement Rationale: Pain Type of Debridement: manual, sharp debridement. Instrumentation: Nail nipper, rotary burr. Number of Nails: 10  Return in about 3 months (around 01/15/2023) for [facility].         Corinna Gab, DPM [facility]

## 2023-01-20 ENCOUNTER — Ambulatory Visit (INDEPENDENT_AMBULATORY_CARE_PROVIDER_SITE_OTHER): Payer: Medicare Other | Admitting: Podiatry

## 2023-01-20 DIAGNOSIS — Z91199 Patient's noncompliance with other medical treatment and regimen due to unspecified reason: Secondary | ICD-10-CM

## 2023-01-20 NOTE — Progress Notes (Signed)
Pt was a no show for apt, charge generated

## 2023-01-21 ENCOUNTER — Telehealth: Payer: Self-pay | Admitting: Podiatry

## 2023-01-21 NOTE — Telephone Encounter (Signed)
Pt left message yesterday 2.26.2024 @ 1221pm stating he will not be able to make appt at 130pm.

## 2023-03-03 ENCOUNTER — Telehealth: Payer: Self-pay | Admitting: Cardiovascular Disease

## 2023-03-03 DIAGNOSIS — I25110 Atherosclerotic heart disease of native coronary artery with unstable angina pectoris: Secondary | ICD-10-CM

## 2023-03-03 NOTE — Telephone Encounter (Signed)
Left arm pain 1-2 times a week, nausea happened one time.  "Not so bad I'm concerned, but it is concerning because of the location."  Has not had stress tet done.  Ordered by Dr Allyson Sabal, but when came to office " she couldn't find my vein and was stuck a few times."  Was told to hydrate and come back next day.  Was stuck 3-4 times.   Stated he was to go to have test done at hospital but then they said $300 co-pay for stress test.  He did not want to pay that when he could have it at Dr Hazle Coca office for different amount.  Someone at hospital said "Let me find out" and he never heard back. He states this was about a year ago and he did not follow up. Does not smoke. Has not had back surgery yet.  Advised he can ask about fo pay when called to schedule.  Had states he had called EMS 6 months ago for the left arm pain because it was pulsating. They wanted him to go to ED  but he refused so took nitro instead. States resolved. Advised that we need him to take test and follow up so we can see what his heart is doing.  We will do our part but we need him to follow as well.  He states he agrees.  Advised I would have them reschedule Stress test and also advise provider to see if he needs any further studies prior to being set for follow up.  Please advise and will also reach out to scheduling for his F/U appt. When calling for Stress test.

## 2023-03-03 NOTE — Telephone Encounter (Signed)
Pt c/o of Chest Pain: STAT if CP now or developed within 24 hours  1. Are you having CP right now? No   2. Are you experiencing any other symptoms (ex. SOB, nausea, vomiting, sweating)? Left arm pain and nausea   3. How long have you been experiencing CP? A few weeks    4. Is your CP continuous or coming and going? Coming and going   5. Have you taken Nitroglycerin? Yes, last taken a couple months back  Patient is requesting an appt to be seen in regards to this.   He also reports he was supposed to have a stress test, but was unable to get it at our office due to them not being able to get the needle in his arm. He was then advised he would have to have it performed at the hospital. The patient was scheduled to have it, but was called by the hospital the day before and was told his copay would be $300. Due to this it was cancelled because he's never had to pay this with our office. Patient is unsure how to go about having the stress test Dr. Allyson Sabal was wanting performed due to this. Please advise. ?

## 2023-03-03 NOTE — Addendum Note (Signed)
Addended by: Candie Chroman on: 03/03/2023 03:58 PM   Modules accepted: Orders

## 2023-03-04 NOTE — Addendum Note (Signed)
Addended by: Bernita Buffy on: 03/04/2023 07:51 AM   Modules accepted: Orders

## 2023-03-04 NOTE — Addendum Note (Signed)
Addended by: Runell Gess on: 03/04/2023 09:35 AM   Modules accepted: Orders

## 2023-03-11 ENCOUNTER — Telehealth (HOSPITAL_COMMUNITY): Payer: Self-pay | Admitting: *Deleted

## 2023-03-11 NOTE — Telephone Encounter (Signed)
Spoke with patient and gave detailed instructions for MPI study scheduled on 03/13/23.

## 2023-03-13 ENCOUNTER — Ambulatory Visit (HOSPITAL_COMMUNITY): Payer: Medicare Other | Attending: Cardiovascular Disease

## 2023-03-13 DIAGNOSIS — I25110 Atherosclerotic heart disease of native coronary artery with unstable angina pectoris: Secondary | ICD-10-CM | POA: Diagnosis not present

## 2023-03-13 LAB — MYOCARDIAL PERFUSION IMAGING
LV dias vol: 77 mL (ref 62–150)
LV sys vol: 25 mL
Nuc Stress EF: 67 %
Peak HR: 85 {beats}/min
Rest HR: 67 {beats}/min
Rest Nuclear Isotope Dose: 10.1 mCi
SDS: 1
SRS: 0
SSS: 1
ST Depression (mm): 0 mm
Stress Nuclear Isotope Dose: 31.7 mCi
TID: 1.11

## 2023-03-13 MED ORDER — REGADENOSON 0.4 MG/5ML IV SOLN
0.4000 mg | Freq: Once | INTRAVENOUS | Status: AC
  Administered 2023-03-13: 0.4 mg via INTRAVENOUS

## 2023-03-13 MED ORDER — TECHNETIUM TC 99M TETROFOSMIN IV KIT
10.1000 | PACK | Freq: Once | INTRAVENOUS | Status: AC | PRN
  Administered 2023-03-13: 10.1 via INTRAVENOUS

## 2023-03-13 MED ORDER — TECHNETIUM TC 99M TETROFOSMIN IV KIT
31.7000 | PACK | Freq: Once | INTRAVENOUS | Status: AC | PRN
  Administered 2023-03-13: 31.7 via INTRAVENOUS

## 2023-03-14 ENCOUNTER — Telehealth: Payer: Self-pay | Admitting: Cardiovascular Disease

## 2023-03-14 NOTE — Telephone Encounter (Signed)
Raymond Gess, MD 03/14/2023  6:36 AM EDT     Nl low risk MV   Spoke with patient regarding the following results. Patient made aware and patient verbalized understanding.   Patient reports still some intermittent chest pain, none at present. Made patient aware of ED precautions should new or worsening symptoms develop. Patient verbalized understanding.    Will forward to MD to make aware.

## 2023-03-14 NOTE — Telephone Encounter (Signed)
Patient is calling to get results from [facility]

## 2023-03-17 ENCOUNTER — Telehealth: Payer: Self-pay | Admitting: Cardiovascular Disease

## 2023-03-17 MED ORDER — NITROGLYCERIN 0.4 MG SL SUBL: 0.4000 mg | SUBLINGUAL_TABLET | SUBLINGUAL | 12 refills | Status: DC | PRN

## 2023-03-17 NOTE — Telephone Encounter (Signed)
Pt c/o medication issue:  1. Name of Medication:  nitroGLYCERIN (NITROSTAT) 0.4 MG SL tablet   2. How are you currently taking this medication (dosage and times per day)? As prescribed  3. Are you having a reaction (difficulty breathing--STAT)? No   4. What is your medication issue? Patient states he left his nitroglycerin at his appt on 04/18 when he went to the [facility] office for testing. He is requesting a new prescription be sent to [facility] drug and PA be submitted be submitted if needed for the medication to be covered. Please advise.

## 2023-03-17 NOTE — Telephone Encounter (Signed)
Call to patient and let him know that prescription will be re-sent. There is no PA for medication , if considered too soon by insurance, there would be a cost.  Good RX is avg $11. Patient states it has been a while since filling RX so should not be a problem for refill. Refill sent

## 2023-04-28 ENCOUNTER — Ambulatory Visit: Payer: Medicare Other | Admitting: Cardiovascular Disease

## 2023-05-01 ENCOUNTER — Encounter: Payer: Self-pay | Admitting: Cardiovascular Disease

## 2023-05-01 ENCOUNTER — Ambulatory Visit: Payer: Medicare Other | Attending: Cardiovascular Disease | Admitting: Cardiovascular Disease

## 2023-05-01 VITALS — BP 132/64 | HR 77 | Ht 73.0 in | Wt 183.6 lb

## 2023-05-01 DIAGNOSIS — E782 Mixed hyperlipidemia: Secondary | ICD-10-CM

## 2023-05-01 DIAGNOSIS — I10 Essential (primary) hypertension: Secondary | ICD-10-CM

## 2023-05-01 DIAGNOSIS — I25750 Atherosclerosis of native coronary artery of transplanted heart with unstable angina: Secondary | ICD-10-CM

## 2023-05-01 NOTE — Assessment & Plan Note (Signed)
History of hyperlipidemia on statin therapy with lipid profile performed 09/27/2020 revealing a total cholesterol 120, LDL 41 and HDL 38.  We will recheck a lipid and liver profile.

## 2023-05-01 NOTE — Assessment & Plan Note (Signed)
History of essential hypertension blood pressure measured today at 132/64.  He is on amlodipine, and metoprolol.

## 2023-05-01 NOTE — Assessment & Plan Note (Signed)
History of CAD status post PCI and stenting of a high-grade obtuse marginal branch stenosis with a Synergy drug-eluting stent by Dr. Katrinka Blazing 02/05/2018.  He currently denies chest pain.  He had a Myoview stress test performed 03/13/2023 which was low risk and nonischemic.

## 2023-05-01 NOTE — Progress Notes (Signed)
05/01/2023 Raymond Melton [facility]   [birth year]  [id number]  Primary Physician Galvin Proffer, MD Primary Cardiologist: Runell Gess MD [name], [place], [place]  HPI:  Raymond Melton is a 67 y.o.    mildly overweight married Caucasian male with no children who currently does not work.  I last saw him in the office 05/21/2022.[name]  He is accompanied by his wife Raymond Melton today who is also a patient of mine.  He has a long history of tobacco abuse smoking one half pack a day until recently when he dramatically reduce this. He does have a history of treated hypertension and diabetes. There is a strong family history of heart disease. He's never had a heart attack or stroke. He was admitted with unstable angina and underwent diagnostic coronary angiography by Dr. Katrinka Blazing on 02/05/18 revealed a high-grade obtuse marginal branch stenosis which was successfully stented using a synergy drug-eluting stent with noncritical disease otherwise and normal LV function. His chest pain has markedly improved since that time.    He had moved to [facility] for a year.  He apparently was hospitalized there and underwent diagnostic cath that did not reveal disease tight enough to warrant intervention.  He apparently was diagnosed with inguinal hernia and underwent elective hernia operation after I saw him in May 2021.  I had performed a Myoview stress test 04/13/2020 which was low risk and nonischemic.    He was getting fairly frequent chest pain when I saw him 6 months ago which she continues to get.   His wife did tell me that he is becoming more forgetful.  I suggested that he see his primary care doctor to be referred to a neurologist for formal neuropsychiatric/cognitive testing.   He did get neuropsychiatric testing but unfortunately did not get along well with his physician.  He did stop smoking 1/2 weeks ago.  He does get some pain in his left upper extremity which sounds orthopedic or neuropathic.   Since I  saw him in the office a year ago he continues to be stable.  He was having some occasional chest pain when I saw him last which he no longer has.  He was considering having back surgery which she has put on the back burner.  He had a Myoview stress test performed/18/24 which was low risk and nonischemic.   Current Meds  Medication Sig   AMBULATORY NON FORMULARY MEDICATION Medication Name:90 ml 2% Lidocaine/90 ml Dicyclomine 10 mg/42ml/270 ml Maalox - Take 5-10 ml every 4-6 hours as needed.   amLODipine (NORVASC) 10 MG tablet Take 10 mg by mouth daily.   clopidogrel (PLAVIX) 75 MG tablet Take 1 tablet (75 mg total) by mouth daily.   dronabinol (MARINOL) 10 MG capsule 1 capsule   Dulaglutide (TRULICITY) 1.5 MG/0.5ML SOPN 0.5 ml   ergocalciferol (VITAMIN D2) 1.25 MG (50000 UT) capsule 1 capsule   metoprolol succinate (TOPROL-XL) 100 MG 24 hr tablet Take 1 tablet (100 mg total) by mouth daily. Take with or immediately following a meal: For high blood pressure   nitroGLYCERIN (NITROSTAT) 0.4 MG SL tablet Place 1 tablet (0.4 mg total) under the tongue every 5 (five) minutes x 3 doses as needed for chest pain.   omeprazole (PRILOSEC) 40 MG capsule Take 1 capsule (40 mg total) by mouth daily. Take one capsule 30 minutes before Breakfast.   rosuvastatin (CRESTOR) 20 MG tablet Take 1 tablet (20 mg total) by mouth daily.   tiZANidine (  ZANAFLEX) 4 MG tablet Take 4 mg by mouth 3 (three) times daily as needed.   TRULICITY 0.75 MG/0.5ML SOPN Inject into the skin.     Allergies  Allergen Reactions   Iodinated Contrast Media Anaphylaxis and Swelling   Iodine Anaphylaxis   Ioxaglate Anaphylaxis and Swelling   Metrizamide Anaphylaxis and Swelling   Other Anaphylaxis, Shortness Of Breath and Swelling    Iv dye   Codeine Itching and Other (See Comments)    Other reaction: restless   Iohexol Other (See Comments)   Latex Other (See Comments)    Unknown reaction   Lisinopril Other (See Comments)    Unknown  reaction   Metformin Hcl Other (See Comments) and Diarrhea   Metoclopramide Other (See Comments)   Mirtazapine Other (See Comments)   Tuberculin Tests Other (See Comments)    Severe skin reaction   Tuberculin, Ppd Other (See Comments)    Other reaction: Severe skin reaction   Valsartan Other (See Comments)    Unknown reaction   Zolpidem Other (See Comments)    confusion    Social History   Socioeconomic History   Marital status: Legally Separated    Spouse name: Not on file   Number of children: Not on file   Years of education: Not on file   Highest education level: Not on file  Occupational History   Not on file  Tobacco Use   Smoking status: Former    Packs/day: .5    Types: Cigarettes    Quit date: 09/2021    Years since quitting: 1.5   Smokeless tobacco: Never  Vaping Use   Vaping Use: Some days  Substance and Sexual Activity   Alcohol use: Yes    Comment: daily   Drug use: No   Sexual activity: Not Currently  Other Topics Concern   Not on file  Social History Narrative   Right handed   Social Determinants of Health   Financial Resource Strain: Not on file  Food Insecurity: Not on file  Transportation Needs: Not on file  Physical Activity: Not on file  Stress: Not on file  Social Connections: Not on file  Intimate Partner Violence: Not on file     Review of Systems: General: negative for chills, fever, night sweats or weight changes.  Cardiovascular: negative for chest pain, dyspnea on exertion, edema, orthopnea, palpitations, paroxysmal nocturnal dyspnea or shortness of breath Dermatological: negative for rash Respiratory: negative for cough or wheezing Urologic: negative for hematuria Abdominal: negative for nausea, vomiting, diarrhea, bright red blood per rectum, melena, or hematemesis Neurologic: negative for visual changes, syncope, or dizziness All other systems reviewed and are otherwise negative except as noted above.    Blood pressure  132/64, pulse 77, height 6\' 1"  (1.854 m), weight 183 lb 9.6 oz (83.3 kg), SpO2 98 %.  General appearance: alert and no distress Neck: no adenopathy, no carotid bruit, no JVD, supple, symmetrical, trachea midline, and thyroid not enlarged, symmetric, no tenderness/mass/nodules Lungs: clear to auscultation bilaterally Heart: regular rate and rhythm, S1, S2 normal, no murmur, click, rub or gallop Extremities: extremities normal, atraumatic, no cyanosis or edema Pulses: 2+ and symmetric Skin: Skin color, texture, turgor normal. No rashes or lesions Neurologic: Grossly normal  EKG sinus rhythm at 77 with first-degree AV block.  I personally reviewed this EKG.  ASSESSMENT AND PLAN:   Hypertension History of essential hypertension blood pressure measured today at 132/64.  He is on amlodipine, and metoprolol.  Coronary artery disease involving  native artery of transplanted heart with unstable angina pectoris [facility]) History of CAD status post PCI and stenting of a high-grade obtuse marginal branch stenosis with a Synergy drug-eluting stent by Dr. Katrinka Blazing 02/05/2018.  He currently denies chest pain.  He had a Myoview stress test performed 03/13/2023 which was low risk and nonischemic.  Hyperlipidemia History of hyperlipidemia on statin therapy with lipid profile performed 09/27/2020 revealing a total cholesterol 120, LDL 41 and HDL 38.  We will recheck a lipid and liver profile.     Runell Gess MD FACP,FACC,FAHA, [facility] 05/01/2023 1:48 PM

## 2023-05-01 NOTE — Patient Instructions (Signed)
Medication Instructions:  The current medical regimen is effective;  continue present plan and medications.  *If you need a refill on your cardiac medications before your next appointment, please call your pharmacy*   Lab Work: LIPID/LIVER - go fasting, nothing to eat or drink  If you have labs (blood work) drawn today and your tests are completely normal, you will receive your results only by: MyChart Message (if you have MyChart) OR A paper copy in the mail If you have any lab test that is abnormal or we need to change your treatment, we will call you to review the results.   Follow-Up: At [facility], you and your health needs are our priority.  As part of our continuing mission to provide you with exceptional heart care, we have created designated Provider Care Teams.  These Care Teams include your primary Cardiologist (physician) and Advanced Practice Providers (APPs -  Physician Assistants and Nurse Practitioners) who all work together to provide you with the care you need, when you need it.  We recommend signing up for the patient portal called "MyChart".  Sign up information is provided on this After Visit Summary.  MyChart is used to connect with patients for Virtual Visits (Telemedicine).  Patients are able to view lab/test results, encounter notes, upcoming appointments, etc.  Non-urgent messages can be sent to your provider as well.   To learn more about what you can do with MyChart, go to [email or web address].    Your next appointment:   12 month(s)  Provider:   Nanetta Batty, MD

## 2023-06-19 ENCOUNTER — Telehealth: Payer: Self-pay

## 2023-06-19 NOTE — Telephone Encounter (Signed)
   Patient Name: Raymond Melton  DOB: 08-Jun-1956 MRN: [id number]  Primary Cardiologist: Nanetta Batty, MD  Chart reviewed as part of pre-operative protocol coverage. Given past medical history and time since last visit, based on ACC/AHA guidelines, Raymond Melton is at acceptable risk for the planned procedure without further cardiovascular testing.   The patient was advised that if he develops new symptoms prior to surgery to contact our office to arrange for a follow-up visit, and he verbalized understanding.  Per office protocol, if patient is without any new symptoms or concerns at the time of their virtual visit, he may hold Plavix for 5 days prior to procedure. Please resume Plavix  as soon as possible postprocedure, at the discretion of the surgeon.    I will route this recommendation to the requesting party via Epic fax function and remove from pre-op pool.  Please call with questions.  Joni Reining, NP 06/19/2023, 1:19 PM

## 2023-06-19 NOTE — Telephone Encounter (Signed)
   Pre-operative Risk Assessment    Patient Name: Raymond Melton  DOB: 1956-08-09 MRN: [id number]      Request for Surgical Clearance    Procedure:   L4-5, L5-S1 Lumbar Microdiskectomy  Date of Surgery:  Clearance TBD                                 Surgeon:  Not Listed Surgeon's Group or Practice Name:  [facility] Phone number:  [phone] Fax number:  [phone]   Type of Clearance Requested:   - Medical  - Pharmacy:  Hold Clopidogrel (Plavix) Not Listed to be held   Type of Anesthesia:  General    Additional requests/questions:    Garrel Ridgel   06/19/2023, 11:25 AM

## 2023-06-26 ENCOUNTER — Other Ambulatory Visit: Payer: Self-pay | Admitting: Neurosurgery

## 2023-06-26 ENCOUNTER — Telehealth: Payer: Self-pay | Admitting: *Deleted

## 2023-06-26 NOTE — Telephone Encounter (Signed)
   Pre-operative Risk Assessment    Patient Name: Raymond Melton  DOB: Apr 20, 1956 MRN: [id number]      Request for Surgical Clearance    Procedure:   L4-5, L5-S1 LUMBAR MICRODISCKECTOMY  Date of Surgery:  Clearance TBD                                 Surgeon:  Mariam Dollar, MD Surgeon's Group or Practice Name:  [facility]  Phone number:  [phone] Fax number:  [phone]   Type of Clearance Requested:   - Medical  - Pharmacy:  Hold Clopidogrel (Plavix) NOT INDICATED HOW LONG   Type of Anesthesia:  General    Additional requests/questions:    Wilhemina Cash   06/26/2023, 12:32 PM

## 2023-06-26 NOTE — Telephone Encounter (Signed)
Patient called stating his surgery has been scheduled for 8/7.

## 2023-06-27 NOTE — Telephone Encounter (Signed)
   Name: Raymond Melton  DOB: 13-Dec-1955  MRN: [id number]   Primary Cardiologist: Nanetta Batty, MD  Chart reviewed as part of pre-operative protocol coverage. [name] was last seen on 05/01/2023 by Dr, Allyson Sabal.  Patient had a normal stress test in April 95188. Spoke with patient briefly today, 06/27/2023. He is doing well from a cardiac perspective. He is independent with all ADLs and is able to do some walking. His normal activity level of riding a bike has been limited secondary to his bike pain.   Therefore, based on ACC/AHA guidelines, the patient would be at acceptable risk for the planned procedure without further cardiovascular testing.   Per office protocol, he may hold Plavix for 5 days prior to procedure and should resume as soon as hemodynamically stable postoperatively.   I will route this recommendation to the requesting party via Epic fax function and remove from pre-op pool. Please call with questions.  Carlos Levering, NP 06/27/2023, 12:20 PM

## 2023-07-01 ENCOUNTER — Other Ambulatory Visit: Payer: Self-pay

## 2023-07-01 ENCOUNTER — Encounter (HOSPITAL_COMMUNITY): Payer: Self-pay | Admitting: Neurosurgery

## 2023-07-01 NOTE — Progress Notes (Signed)
Anesthesia Chart Review: Same day workup  Follows with cardiology for history of HTN, HLD, CAD s/p PCI and DES of obtuse marginal 02/05/2018.  Myoview 03/13/2023 was low risk and nonischemic.  Cardiac clearance per telephone encounter 06/27/2023 by Carlos Levering, NP states, "Chart reviewed as part of pre-operative protocol coverage. Raymond Melton was last seen on 05/01/2023 by Dr, Allyson Sabal.  Patient had a normal stress test in April 13086. Spoke with patient briefly today, 06/27/2023. He is doing well from a cardiac perspective. He is independent with all ADLs and is able to do some walking. His normal activity level of riding a bike has been limited secondary to his bike pain. Therefore, based on ACC/AHA guidelines, the patient would be at acceptable risk for the planned procedure without further cardiovascular testing. Per office protocol, he may hold Plavix for 5 days prior to procedure and should resume as soon as hemodynamically stable postoperatively."  Patient reports last dose Plavix 06/26/2023.  History of hep C status post treatment with Harvoni.  Non-insulin-dependent DM2.  Last dose Trulicity 06/23/2023.  Last A1c available in epic 5.5 on 07/15/2022  Patient will need day of surgery labs and evaluation.  EKG 05/01/2023: Sinus rhythm with first-degree AV block.  Rate 77.  Nuclear stress 03/13/2023:   The study is normal. The study is low risk.   No ST deviation was noted.   Left ventricular function is normal. Nuclear stress EF: 67 %. The left ventricular ejection fraction is hyperdynamic (>65%). End diastolic cavity size is normal.   Prior study available for comparison from 04/13/2020.  TTE 04/07/2020:  1. Left ventricular ejection fraction, by estimation, is 60 to 65%. Left  ventricular ejection fraction by PLAX is 62 %. The left ventricle has  normal function. The left ventricle has no regional wall motion  abnormalities. Left ventricular diastolic  parameters are consistent with Grade  I diastolic dysfunction (impaired  relaxation).   2. Right ventricular systolic function is normal. The right ventricular  size is normal.   3. The mitral valve is normal in structure. Trivial mitral valve  regurgitation. No evidence of mitral stenosis.   4. The aortic valve is tricuspid. Aortic valve regurgitation is not  visualized. No aortic stenosis is present.   5. The inferior vena cava is normal in size with greater than 50%  respiratory variability, suggesting right atrial pressure of 3 mmHg.     Zannie Cove [facility] Stay [facility]/Anesthesiology Phone [phone] 07/01/2023 12:02 PM

## 2023-07-01 NOTE — Anesthesia Preprocedure Evaluation (Signed)
Anesthesia Evaluation  Patient identified by MRN, date of birth, ID band Patient awake    Reviewed: Allergy & Precautions, H&P , NPO status , Patient's Chart, lab work & pertinent test results  Airway Mallampati: III  TM Distance: <3 FB Neck ROM: Full  Mouth opening: Limited Mouth Opening  Dental no notable dental hx.    Pulmonary former smoker   Pulmonary exam normal breath sounds clear to auscultation       Cardiovascular hypertension, Pt. on medications and Pt. on home beta blockers + CAD and + Cardiac Stents  Normal cardiovascular exam Rhythm:Regular Rate:Normal     Neuro/Psych     Bipolar Disorder   negative neurological ROS     GI/Hepatic negative GI ROS,,,Hep C treated with Harvoni   Endo/Other  diabetes    Renal/GU negative Renal ROS  negative genitourinary   Musculoskeletal negative musculoskeletal ROS (+)    Abdominal   Peds negative pediatric ROS (+)  Hematology negative hematology ROS (+)   Anesthesia Other Findings   Reproductive/Obstetrics negative OB ROS                             Anesthesia Physical Anesthesia Plan  ASA: 3  Anesthesia Plan: General   Post-op Pain Management: Ketamine IV*   Induction: Intravenous  PONV Risk Score and Plan: 2 and Ondansetron, Dexamethasone and Treatment may vary due to age or medical condition  Airway Management Planned: Oral ETT and Video Laryngoscope Planned  Additional Equipment:   Intra-op Plan:   Post-operative Plan: Extubation in OR  Informed Consent: I have reviewed the patients History and Physical, chart, labs and discussed the procedure including the risks, benefits and alternatives for the proposed anesthesia with the patient or authorized representative who has indicated his/her understanding and acceptance.     Dental advisory given  Plan Discussed with: CRNA and Surgeon  Anesthesia Plan Comments: (PAT  note by Antionette Poles, PA-C: Follows with cardiology for history of HTN, HLD, CAD s/p PCI and DES of obtuse marginal 02/05/2018.  Myoview 03/13/2023 was low risk and nonischemic.  Cardiac clearance per telephone encounter 06/27/2023 by Carlos Levering, NP states, "Chart reviewed as part of pre-operative protocol coverage. Dillion Kuethe Enterline was last seen on 05/01/2023 by Dr, Allyson Sabal.  Patient had a normal stress test in April 54098. Spoke with patient briefly today, 06/27/2023. He is doing well from a cardiac perspective. He is independent with all ADLs and is able to do some walking. His normal activity level of riding a bike has been limited secondary to his bike pain. Therefore, based on ACC/AHA guidelines, the patient would be at acceptable risk for the planned procedure without further cardiovascular testing. Per office protocol, he may hold Plavix for 5 days prior to procedure and should resume as soon as hemodynamically stable postoperatively."  Patient reports last dose Plavix 06/26/2023.  History of hep C status post treatment with Harvoni.  Non-insulin-dependent DM2.  Last dose Trulicity 06/23/2023.  Last A1c available in epic 5.5 on 07/15/2022  Patient will need day of surgery labs and evaluation.  EKG 05/01/2023: Sinus rhythm with first-degree AV block.  Rate 77.  Nuclear stress 03/13/2023:   The study is normal. The study is low risk.   No ST deviation was noted.   Left ventricular function is normal. Nuclear stress EF: 67 %. The left ventricular ejection fraction is hyperdynamic (>65%). End diastolic cavity size is normal.   Prior study available  for comparison from 04/13/2020.  TTE 04/07/2020: 1. Left ventricular ejection fraction, by estimation, is 60 to 65%. Left  ventricular ejection fraction by PLAX is 62 %. The left ventricle has  normal function. The left ventricle has no regional wall motion  abnormalities. Left ventricular diastolic  parameters are consistent with Grade I diastolic  dysfunction (impaired  relaxation).  2. Right ventricular systolic function is normal. The right ventricular  size is normal.  3. The mitral valve is normal in structure. Trivial mitral valve  regurgitation. No evidence of mitral stenosis.  4. The aortic valve is tricuspid. Aortic valve regurgitation is not  visualized. No aortic stenosis is present.  5. The inferior vena cava is normal in size with greater than 50%  respiratory variability, suggesting right atrial pressure of 3 mmHg.   )        Anesthesia Quick Evaluation

## 2023-07-01 NOTE — Progress Notes (Signed)
SDW call  Patient was given pre-op instructions over the phone. Patient verbalized understanding of instructions provided.     PCP - Dr. Helaine Chess Cardiologist - Dr. Nanetta Batty Pulmonary:    PPM/ICD - denies Device Orders - na Rep Notified - na   Chest x-ray - n/a EKG -  05/01/2023 Stress Test - 03/13/2023 ECHO - 04/07/2020 Cardiac Cath - 02/05/2018  Sleep Study/sleep apnea/CPAP: denies  Type II diabetic Fasting Blood sugar range: Does not check his sugar How often check sugars: does not check his sugar Trulicity, states last dose 06/23/2023   Blood Thinner Instructions: Plavix, states last dose 06/26/2023 Aspirin Instructions:states last dose 06/26/2023   ERAS Protcol - Yes, clear fluids until 1110   COVID TEST- n/a    Anesthesia review: Yes. HTN, DM, Hep C, ETOH abuse, latent TB, heart cath with stents   Patient denies shortness of breath, fever, cough and chest pain over the phone call  Your procedure is scheduled on Wednesday July 02, 2023  Report to [facility] Main Entrance "A" at 1140   A.M., then check in with the Admitting office.  Call this number if you have problems the morning of surgery:  [phone]   If you have any questions prior to your surgery date call [phone]: Open Monday-Friday 8am-4pm If you experience any cold or flu symptoms such as cough, fever, chills, shortness of breath, etc. between now and your scheduled surgery, please notify us at the above number     Remember:  Do not eat after midnight the night before your surgery  You may drink clear liquids until  1110   the morning of your surgery.   Clear liquids allowed are: Water, Non-Citrus Juices (without pulp), Carbonated Beverages, Clear Tea, Black Coffee ONLY (NO MILK, CREAM OR POWDERED CREAMER of any kind), and Gatorade   Take these medicines the morning of surgery with A SIP OF WATER:  Amlodipine, metoprolol, crestor  As needed: Xanax, zanaflex  As of today, STOP taking any  Aleve, Naproxen, Ibuprofen, Motrin, Advil, Goody's, BC's, all herbal medications, fish oil, and all vitamins.

## 2023-07-02 ENCOUNTER — Encounter (HOSPITAL_COMMUNITY): Payer: Self-pay | Admitting: Neurosurgery

## 2023-07-02 ENCOUNTER — Ambulatory Visit (HOSPITAL_COMMUNITY)
Admission: RE | Disposition: A | Discharge status: Home / Self Care | Payer: Self-pay | Source: Ambulatory Visit | Attending: Neurosurgery

## 2023-07-02 ENCOUNTER — Ambulatory Visit (HOSPITAL_BASED_OUTPATIENT_CLINIC_OR_DEPARTMENT_OTHER): Payer: Medicare Other | Admitting: Physician Assistant

## 2023-07-02 ENCOUNTER — Ambulatory Visit (HOSPITAL_COMMUNITY): Payer: Medicare Other

## 2023-07-02 ENCOUNTER — Other Ambulatory Visit: Payer: Self-pay

## 2023-07-02 ENCOUNTER — Ambulatory Visit (HOSPITAL_COMMUNITY)
Admission: RE | Admit: 2023-07-02 | Discharge: 2023-07-03 | Disposition: A | Discharge status: Home / Self Care | Payer: Medicare Other | Source: Ambulatory Visit | Attending: Neurosurgery | Admitting: Neurosurgery

## 2023-07-02 ENCOUNTER — Ambulatory Visit (HOSPITAL_COMMUNITY): Payer: Medicare Other | Admitting: Physician Assistant

## 2023-07-02 DIAGNOSIS — I10 Essential (primary) hypertension: Secondary | ICD-10-CM

## 2023-07-02 DIAGNOSIS — Z87891 Personal history of nicotine dependence: Secondary | ICD-10-CM

## 2023-07-02 DIAGNOSIS — E785 Hyperlipidemia, unspecified: Secondary | ICD-10-CM | POA: Diagnosis not present

## 2023-07-02 DIAGNOSIS — M5127 Other intervertebral disc displacement, lumbosacral region: Secondary | ICD-10-CM

## 2023-07-02 DIAGNOSIS — I2510 Atherosclerotic heart disease of native coronary artery without angina pectoris: Secondary | ICD-10-CM | POA: Insufficient documentation

## 2023-07-02 DIAGNOSIS — Z955 Presence of coronary angioplasty implant and graft: Secondary | ICD-10-CM | POA: Insufficient documentation

## 2023-07-02 DIAGNOSIS — M5126 Other intervertebral disc displacement, lumbar region: Secondary | ICD-10-CM | POA: Diagnosis present

## 2023-07-02 DIAGNOSIS — Z7985 Long-term (current) use of injectable non-insulin antidiabetic drugs: Secondary | ICD-10-CM | POA: Diagnosis not present

## 2023-07-02 DIAGNOSIS — E119 Type 2 diabetes mellitus without complications: Secondary | ICD-10-CM | POA: Insufficient documentation

## 2023-07-02 DIAGNOSIS — Z8619 Personal history of other infectious and parasitic diseases: Secondary | ICD-10-CM | POA: Insufficient documentation

## 2023-07-02 DIAGNOSIS — M79605 Pain in left leg: Secondary | ICD-10-CM | POA: Diagnosis not present

## 2023-07-02 DIAGNOSIS — M5117 Intervertebral disc disorders with radiculopathy, lumbosacral region: Secondary | ICD-10-CM | POA: Diagnosis present

## 2023-07-02 DIAGNOSIS — Z79899 Other long term (current) drug therapy: Secondary | ICD-10-CM | POA: Diagnosis not present

## 2023-07-02 HISTORY — DX: Other specified postprocedural states: Z98.890

## 2023-07-02 HISTORY — PX: LUMBAR LAMINECTOMY/DECOMPRESSION MICRODISCECTOMY: SHX5026

## 2023-07-02 HISTORY — DX: Gastro-esophageal reflux disease without esophagitis: K21.9

## 2023-07-02 HISTORY — DX: Anxiety disorder, unspecified: F41.9

## 2023-07-02 HISTORY — DX: Unspecified osteoarthritis, unspecified site: M19.90

## 2023-07-02 HISTORY — DX: Personal history of urinary calculi: Z87.442

## 2023-07-02 HISTORY — DX: Depression, unspecified: F32.A

## 2023-07-02 HISTORY — DX: Other complications of anesthesia, initial encounter: T88.59XA

## 2023-07-02 HISTORY — DX: Nausea with vomiting, unspecified: R11.2

## 2023-07-02 LAB — CBC
HCT: 45.7 % (ref 39.0–52.0)
Hemoglobin: 15.6 g/dL (ref 13.0–17.0)
MCH: 31 pg (ref 26.0–34.0)
MCHC: 34.1 g/dL (ref 30.0–36.0)
MCV: 90.7 fL (ref 80.0–100.0)
Platelets: 202 10*3/uL (ref 150–400)
RBC: 5.04 MIL/uL (ref 4.22–5.81)
RDW: 12.3 % (ref 11.5–15.5)
WBC: 5.2 10*3/uL (ref 4.0–10.5)
nRBC: 0 % (ref 0.0–0.2)

## 2023-07-02 LAB — COMPREHENSIVE METABOLIC PANEL
ALT: 68 U/L — ABNORMAL HIGH (ref 0–44)
AST: 34 U/L (ref 15–41)
Albumin: 4.5 g/dL (ref 3.5–5.0)
Alkaline Phosphatase: 110 U/L (ref 38–126)
Anion gap: 13 (ref 5–15)
BUN: 18 mg/dL (ref 8–23)
CO2: 23 mmol/L (ref 22–32)
Calcium: 9.6 mg/dL (ref 8.9–10.3)
Chloride: 98 mmol/L (ref 98–111)
Creatinine, Ser: 0.83 mg/dL (ref 0.61–1.24)
GFR, Estimated: >60 mL/min (ref >60)
Glucose, Bld: 118 mg/dL — ABNORMAL HIGH (ref 70–99)
Potassium: 4.1 mmol/L (ref 3.5–5.1)
Sodium: 134 mmol/L — ABNORMAL LOW (ref 135–145)
Total Bilirubin: 0.6 mg/dL (ref 0.3–1.2)
Total Protein: 7.6 g/dL (ref 6.5–8.1)

## 2023-07-02 LAB — SURGICAL PCR SCREEN
MRSA, PCR: NEGATIVE
Staphylococcus aureus: NEGATIVE

## 2023-07-02 LAB — GLUCOSE, CAPILLARY
Glucose-Capillary: 135 mg/dL — ABNORMAL HIGH (ref 70–99)
Glucose-Capillary: 121 mg/dL — ABNORMAL HIGH (ref 70–99)
Glucose-Capillary: 187 mg/dL — ABNORMAL HIGH (ref 70–99)

## 2023-07-02 SURGERY — LUMBAR LAMINECTOMY/DECOMPRESSION MICRODISCECTOMY 2 LEVELS
Anesthesia: General | Site: Back | Laterality: Left

## 2023-07-02 MED ORDER — CHLORHEXIDINE GLUCONATE CLOTH 2 % EX PADS: 6.0000 | MEDICATED_PAD | Freq: Once | CUTANEOUS | Status: DC

## 2023-07-02 MED ORDER — LIDOCAINE-EPINEPHRINE 1 %-1:100000 IJ SOLN
INTRAMUSCULAR | Status: AC
  Filled 2023-07-02: qty 1

## 2023-07-02 MED ORDER — SODIUM CHLORIDE 0.9% FLUSH: 3.0000 mL | INTRAVENOUS | Status: DC | PRN

## 2023-07-02 MED ORDER — 0.9 % SODIUM CHLORIDE (POUR BTL) OPTIME
TOPICAL | Status: DC | PRN
  Administered 2023-07-02: 1000 mL

## 2023-07-02 MED ORDER — CYCLOBENZAPRINE HCL 10 MG PO TABS
10.0000 mg | ORAL_TABLET | Freq: Three times a day (TID) | ORAL | Status: DC | PRN
  Administered 2023-07-02 – 2023-07-03 (×2): 10 mg via ORAL
  Filled 2023-07-02 (×3): qty 1

## 2023-07-02 MED ORDER — PHENYLEPHRINE HCL-NACL 20-0.9 MG/250ML-% IV SOLN
INTRAVENOUS | Status: DC | PRN
  Administered 2023-07-02: 40 ug/min via INTRAVENOUS

## 2023-07-02 MED ORDER — AMLODIPINE BESYLATE 10 MG PO TABS
10.0000 mg | ORAL_TABLET | Freq: Every day | ORAL | Status: DC
  Administered 2023-07-03: 10 mg via ORAL
  Filled 2023-07-02 (×2): qty 1

## 2023-07-02 MED ORDER — ONDANSETRON HCL 4 MG/2ML IJ SOLN
INTRAMUSCULAR | Status: DC | PRN
Start: 2023-07-02 — End: 2023-07-02
  Administered 2023-07-02: 4 mg via INTRAVENOUS

## 2023-07-02 MED ORDER — DEXMEDETOMIDINE HCL IN NACL 80 MCG/20ML IV SOLN
INTRAVENOUS | Status: DC | PRN
  Administered 2023-07-02: 4 ug via INTRAVENOUS
  Administered 2023-07-02 (×2): 8 ug via INTRAVENOUS

## 2023-07-02 MED ORDER — HYDROMORPHONE HCL 1 MG/ML IJ SOLN
0.5000 mg | INTRAMUSCULAR | Status: DC | PRN
  Administered 2023-07-02 – 2023-07-03 (×3): 0.5 mg via INTRAVENOUS
  Filled 2023-07-02 (×3): qty 0.5

## 2023-07-02 MED ORDER — KETAMINE HCL 50 MG/5ML IJ SOSY
PREFILLED_SYRINGE | INTRAMUSCULAR | Status: AC
  Filled 2023-07-02: qty 5

## 2023-07-02 MED ORDER — PANTOPRAZOLE SODIUM 40 MG IV SOLR
40.0000 mg | Freq: Every day | INTRAVENOUS | Status: DC
  Administered 2023-07-02: 40 mg via INTRAVENOUS
  Filled 2023-07-02: qty 10

## 2023-07-02 MED ORDER — ALUM & MAG HYDROXIDE-SIMETH 200-200-20 MG/5ML PO SUSP: 30.0000 mL | Freq: Four times a day (QID) | ORAL | Status: DC | PRN

## 2023-07-02 MED ORDER — THROMBIN 5000 UNITS EX SOLR
CUTANEOUS | Status: AC
  Filled 2023-07-02: qty 10000

## 2023-07-02 MED ORDER — ROCURONIUM BROMIDE 10 MG/ML (PF) SYRINGE
PREFILLED_SYRINGE | INTRAVENOUS | Status: AC
  Filled 2023-07-02: qty 10

## 2023-07-02 MED ORDER — CEFAZOLIN SODIUM-DEXTROSE 2-4 GM/100ML-% IV SOLN
2.0000 g | Freq: Three times a day (TID) | INTRAVENOUS | Status: AC
  Administered 2023-07-02 – 2023-07-03 (×2): 2 g via INTRAVENOUS
  Filled 2023-07-02 (×2): qty 100

## 2023-07-02 MED ORDER — DEXAMETHASONE SODIUM PHOSPHATE 10 MG/ML IJ SOLN
INTRAMUSCULAR | Status: DC | PRN
  Administered 2023-07-02: 5 mg via INTRAVENOUS

## 2023-07-02 MED ORDER — SODIUM CHLORIDE 0.9 % IV SOLN
250.0000 mL | INTRAVENOUS | Status: DC
  Administered 2023-07-02: 250 mL via INTRAVENOUS

## 2023-07-02 MED ORDER — CALCIUM CARBONATE ANTACID 500 MG PO CHEW
2.0000 | CHEWABLE_TABLET | Freq: Two times a day (BID) | ORAL | Status: DC | PRN
  Administered 2023-07-02: 400 mg via ORAL
  Filled 2023-07-02: qty 2

## 2023-07-02 MED ORDER — SODIUM CHLORIDE 0.9% FLUSH
3.0000 mL | Freq: Two times a day (BID) | INTRAVENOUS | Status: DC
  Administered 2023-07-02: 3 mL via INTRAVENOUS

## 2023-07-02 MED ORDER — ORAL CARE MOUTH RINSE: 15.0000 mL | Freq: Once | OROMUCOSAL | Status: AC

## 2023-07-02 MED ORDER — THROMBIN (RECOMBINANT) 5000 UNITS EX SOLR
CUTANEOUS | Status: DC | PRN
  Administered 2023-07-02: 10 mL via TOPICAL

## 2023-07-02 MED ORDER — EPHEDRINE SULFATE-NACL 50-0.9 MG/10ML-% IV SOSY
PREFILLED_SYRINGE | INTRAVENOUS | Status: DC | PRN
  Administered 2023-07-02 (×3): 5 mg via INTRAVENOUS

## 2023-07-02 MED ORDER — CHLORHEXIDINE GLUCONATE 0.12 % MT SOLN
OROMUCOSAL | Status: AC
  Administered 2023-07-02: 15 mL via OROMUCOSAL
  Filled 2023-07-02: qty 15

## 2023-07-02 MED ORDER — PROPOFOL 10 MG/ML IV BOLUS
INTRAVENOUS | Status: AC
  Filled 2023-07-02: qty 20

## 2023-07-02 MED ORDER — CALCIUM CARBONATE ANTACID 750 MG PO CHEW: 2.0000 | CHEWABLE_TABLET | Freq: Every day | ORAL | Status: DC | PRN

## 2023-07-02 MED ORDER — ASPIRIN 81 MG PO TBEC
81.0000 mg | DELAYED_RELEASE_TABLET | Freq: Every day | ORAL | Status: DC
  Administered 2023-07-02 – 2023-07-03 (×2): 81 mg via ORAL
  Filled 2023-07-02 (×2): qty 1

## 2023-07-02 MED ORDER — PHENOL 1.4 % MT LIQD: 1.0000 | OROMUCOSAL | Status: DC | PRN

## 2023-07-02 MED ORDER — FENTANYL CITRATE (PF) 250 MCG/5ML IJ SOLN
INTRAMUSCULAR | Status: AC
  Filled 2023-07-02: qty 5

## 2023-07-02 MED ORDER — CHLORHEXIDINE GLUCONATE 0.12 % MT SOLN: 15.0000 mL | Freq: Once | OROMUCOSAL | Status: AC

## 2023-07-02 MED ORDER — OXYCODONE HCL 5 MG PO TABS: 5.0000 mg | ORAL_TABLET | Freq: Once | ORAL | Status: DC | PRN

## 2023-07-02 MED ORDER — SIMETHICONE 80 MG PO CHEW
80.0000 mg | CHEWABLE_TABLET | Freq: Four times a day (QID) | ORAL | Status: DC | PRN
  Administered 2023-07-02: 80 mg via ORAL
  Filled 2023-07-02: qty 1

## 2023-07-02 MED ORDER — LACTATED RINGERS IV SOLN: INTRAVENOUS | Status: DC

## 2023-07-02 MED ORDER — BUPIVACAINE HCL (PF) 0.25 % IJ SOLN
INTRAMUSCULAR | Status: AC
  Filled 2023-07-02: qty 30

## 2023-07-02 MED ORDER — ONDANSETRON HCL 4 MG/2ML IJ SOLN
INTRAMUSCULAR | Status: AC
  Filled 2023-07-02: qty 2

## 2023-07-02 MED ORDER — SUCCINYLCHOLINE CHLORIDE 200 MG/10ML IV SOSY
PREFILLED_SYRINGE | INTRAVENOUS | Status: AC
  Filled 2023-07-02: qty 10

## 2023-07-02 MED ORDER — HYDROMORPHONE HCL 1 MG/ML IJ SOLN
INTRAMUSCULAR | Status: AC
  Filled 2023-07-02: qty 1

## 2023-07-02 MED ORDER — KETOROLAC TROMETHAMINE 30 MG/ML IJ SOLN
30.0000 mg | Freq: Once | INTRAMUSCULAR | Status: AC | PRN
  Administered 2023-07-02: 30 mg via INTRAVENOUS

## 2023-07-02 MED ORDER — ROCURONIUM BROMIDE 10 MG/ML (PF) SYRINGE
PREFILLED_SYRINGE | INTRAVENOUS | Status: DC | PRN
  Administered 2023-07-02: 20 mg via INTRAVENOUS
  Administered 2023-07-02: 10 mg via INTRAVENOUS
  Administered 2023-07-02: 60 mg via INTRAVENOUS

## 2023-07-02 MED ORDER — DULAGLUTIDE 3 MG/0.5ML ~~LOC~~ SOAJ: 3.0000 mg | SUBCUTANEOUS | Status: DC

## 2023-07-02 MED ORDER — CEFAZOLIN SODIUM-DEXTROSE 2-4 GM/100ML-% IV SOLN
2.0000 g | INTRAVENOUS | Status: AC
  Administered 2023-07-02: 2 g via INTRAVENOUS
  Filled 2023-07-02: qty 100

## 2023-07-02 MED ORDER — NITROGLYCERIN 0.4 MG SL SUBL: 0.4000 mg | SUBLINGUAL_TABLET | SUBLINGUAL | Status: DC | PRN

## 2023-07-02 MED ORDER — MENTHOL 3 MG MT LOZG: 1.0000 | LOZENGE | OROMUCOSAL | Status: DC | PRN

## 2023-07-02 MED ORDER — ALPRAZOLAM 0.5 MG PO TABS: 1.0000 mg | ORAL_TABLET | Freq: Three times a day (TID) | ORAL | Status: DC | PRN

## 2023-07-02 MED ORDER — BUPIVACAINE HCL (PF) 0.25 % IJ SOLN
INTRAMUSCULAR | Status: DC | PRN
  Administered 2023-07-02: 10 mL

## 2023-07-02 MED ORDER — METOPROLOL SUCCINATE ER 100 MG PO TB24
100.0000 mg | ORAL_TABLET | Freq: Every day | ORAL | Status: DC
  Administered 2023-07-03: 100 mg via ORAL
  Filled 2023-07-02 (×2): qty 1

## 2023-07-02 MED ORDER — DEXAMETHASONE SODIUM PHOSPHATE 10 MG/ML IJ SOLN
10.0000 mg | Freq: Four times a day (QID) | INTRAMUSCULAR | Status: DC
  Administered 2023-07-02 – 2023-07-03 (×2): 10 mg via INTRAVENOUS
  Filled 2023-07-02 (×3): qty 1

## 2023-07-02 MED ORDER — LIDOCAINE-EPINEPHRINE 1 %-1:100000 IJ SOLN
INTRAMUSCULAR | Status: DC | PRN
  Administered 2023-07-02: 10 mL

## 2023-07-02 MED ORDER — TIZANIDINE HCL 4 MG PO TABS: 4.0000 mg | ORAL_TABLET | Freq: Three times a day (TID) | ORAL | Status: DC | PRN

## 2023-07-02 MED ORDER — ONDANSETRON HCL 4 MG/2ML IJ SOLN
4.0000 mg | Freq: Once | INTRAMUSCULAR | Status: AC | PRN
  Administered 2023-07-02: 4 mg via INTRAVENOUS

## 2023-07-02 MED ORDER — LIDOCAINE 2% (20 MG/ML) 5 ML SYRINGE
INTRAMUSCULAR | Status: AC
  Filled 2023-07-02: qty 5

## 2023-07-02 MED ORDER — FENTANYL CITRATE (PF) 250 MCG/5ML IJ SOLN
INTRAMUSCULAR | Status: DC | PRN
  Administered 2023-07-02 (×3): 50 ug via INTRAVENOUS
  Administered 2023-07-02: 100 ug via INTRAVENOUS

## 2023-07-02 MED ORDER — HYDROMORPHONE HCL 1 MG/ML IJ SOLN
0.2500 mg | INTRAMUSCULAR | Status: DC | PRN
  Administered 2023-07-02 (×3): 0.5 mg via INTRAVENOUS

## 2023-07-02 MED ORDER — KETOROLAC TROMETHAMINE 30 MG/ML IJ SOLN
INTRAMUSCULAR | Status: AC
  Filled 2023-07-02: qty 1

## 2023-07-02 MED ORDER — GABAPENTIN 300 MG PO CAPS
300.0000 mg | ORAL_CAPSULE | Freq: Every day | ORAL | Status: DC
  Filled 2023-07-02: qty 1

## 2023-07-02 MED ORDER — OXYCODONE HCL 5 MG PO TABS
10.0000 mg | ORAL_TABLET | ORAL | Status: DC | PRN
  Administered 2023-07-02 – 2023-07-03 (×3): 10 mg via ORAL
  Filled 2023-07-02 (×4): qty 2

## 2023-07-02 MED ORDER — ONDANSETRON HCL 4 MG/2ML IJ SOLN
4.0000 mg | Freq: Four times a day (QID) | INTRAMUSCULAR | Status: DC | PRN
  Filled 2023-07-02: qty 2

## 2023-07-02 MED ORDER — ONDANSETRON HCL 4 MG PO TABS: 4.0000 mg | ORAL_TABLET | Freq: Four times a day (QID) | ORAL | Status: DC | PRN

## 2023-07-02 MED ORDER — KETAMINE HCL 10 MG/ML IJ SOLN
INTRAMUSCULAR | Status: DC | PRN
  Administered 2023-07-02: 10 mg via INTRAVENOUS

## 2023-07-02 MED ORDER — LIDOCAINE 2% (20 MG/ML) 5 ML SYRINGE
INTRAMUSCULAR | Status: DC | PRN
  Administered 2023-07-02: 100 mg via INTRAVENOUS

## 2023-07-02 MED ORDER — DEXAMETHASONE SODIUM PHOSPHATE 10 MG/ML IJ SOLN
INTRAMUSCULAR | Status: AC
  Filled 2023-07-02: qty 1

## 2023-07-02 MED ORDER — MIDAZOLAM HCL 2 MG/2ML IJ SOLN
INTRAMUSCULAR | Status: AC
  Filled 2023-07-02: qty 2

## 2023-07-02 MED ORDER — ACETAMINOPHEN 650 MG RE SUPP: 650.0000 mg | RECTAL | Status: DC | PRN

## 2023-07-02 MED ORDER — DEXAMETHASONE SODIUM PHOSPHATE 10 MG/ML IJ SOLN
10.0000 mg | Freq: Once | INTRAMUSCULAR | Status: AC
  Administered 2023-07-02: 10 mg via INTRAVENOUS

## 2023-07-02 MED ORDER — ROSUVASTATIN CALCIUM 20 MG PO TABS
20.0000 mg | ORAL_TABLET | Freq: Every day | ORAL | Status: DC
  Administered 2023-07-02 – 2023-07-03 (×2): 20 mg via ORAL
  Filled 2023-07-02 (×2): qty 1

## 2023-07-02 MED ORDER — ACETAMINOPHEN 325 MG PO TABS
650.0000 mg | ORAL_TABLET | ORAL | Status: DC | PRN
  Administered 2023-07-02 – 2023-07-03 (×2): 650 mg via ORAL
  Filled 2023-07-02 (×2): qty 2

## 2023-07-02 MED ORDER — LINACLOTIDE 72 MCG PO CAPS
72.0000 ug | ORAL_CAPSULE | Freq: Every day | ORAL | Status: DC
  Administered 2023-07-03: 72 ug via ORAL
  Filled 2023-07-02: qty 1

## 2023-07-02 MED ORDER — MELATONIN 5 MG PO TABS: 10.0000 mg | ORAL_TABLET | Freq: Every evening | ORAL | Status: DC | PRN

## 2023-07-02 MED ORDER — OXYCODONE HCL 5 MG/5ML PO SOLN: 5.0000 mg | Freq: Once | ORAL | Status: DC | PRN

## 2023-07-02 MED ORDER — IBUPROFEN 400 MG PO TABS: 400.0000 mg | ORAL_TABLET | Freq: Four times a day (QID) | ORAL | Status: DC | PRN

## 2023-07-02 MED ORDER — PROPOFOL 10 MG/ML IV BOLUS
INTRAVENOUS | Status: DC | PRN
Start: 2023-07-02 — End: 2023-07-02
  Administered 2023-07-02: 50 mg via INTRAVENOUS
  Administered 2023-07-02: 150 mg via INTRAVENOUS

## 2023-07-02 SURGICAL SUPPLY — 52 items
ADH SKN CLS APL DERMABOND .7 (GAUZE/BANDAGES/DRESSINGS) ×1
APL SKNCLS STERI-STRIP NONHPOA (GAUZE/BANDAGES/DRESSINGS) ×1
BAG COUNTER SPONGE SURGICOUNT (BAG) ×1 IMPLANT
BAG SPNG CNTER NS LX DISP (BAG) ×1
BENZOIN TINCTURE PRP APPL 2/3 (GAUZE/BANDAGES/DRESSINGS) ×1 IMPLANT
BLADE CLIPPER SURG (BLADE) IMPLANT
BLADE SURG 11 STRL SS (BLADE) ×1 IMPLANT
BUR CUTTER 7.0 ROUND (BURR) ×1 IMPLANT
BUR MATCHSTICK NEURO 3.0 LAGG (BURR) ×1 IMPLANT
CANISTER SUCT 3000ML PPV (MISCELLANEOUS) ×1 IMPLANT
DERMABOND ADVANCED .7 DNX12 (GAUZE/BANDAGES/DRESSINGS) ×1 IMPLANT
DRAPE HALF SHEET 40X57 (DRAPES) IMPLANT
DRAPE LAPAROTOMY 100X72X124 (DRAPES) ×1 IMPLANT
DRAPE MICROSCOPE SLANT 54X150 (MISCELLANEOUS) ×1 IMPLANT
DRAPE SURG 17X23 STRL (DRAPES) ×1 IMPLANT
DRSG OPSITE POSTOP 4X6 (GAUZE/BANDAGES/DRESSINGS) IMPLANT
DURAPREP 26ML APPLICATOR (WOUND CARE) ×1 IMPLANT
ELECT BLADE 4.0 EZ CLEAN MEGAD (MISCELLANEOUS) ×1
ELECT REM PT RETURN 9FT ADLT (ELECTROSURGICAL) ×1
ELECTRODE BLDE 4.0 EZ CLN MEGD (MISCELLANEOUS) IMPLANT
ELECTRODE REM PT RTRN 9FT ADLT (ELECTROSURGICAL) ×1 IMPLANT
GAUZE 4X4 16PLY ~~LOC~~+RFID DBL (SPONGE) IMPLANT
GAUZE SPONGE 4X4 12PLY STRL (GAUZE/BANDAGES/DRESSINGS) ×1 IMPLANT
GLOVE BIO SURGEON STRL SZ7 (GLOVE) IMPLANT
GLOVE BIO SURGEON STRL SZ7.5 (GLOVE) IMPLANT
GLOVE BIO SURGEON STRL SZ8 (GLOVE) ×1 IMPLANT
GLOVE BIOGEL PI IND STRL 7.0 (GLOVE) IMPLANT
GLOVE EXAM NITRILE XL STR (GLOVE) IMPLANT
GLOVE INDICATOR 8.5 STRL (GLOVE) ×2 IMPLANT
GOWN STRL REUS W/ TWL LRG LVL3 (GOWN DISPOSABLE) ×1 IMPLANT
GOWN STRL REUS W/ TWL XL LVL3 (GOWN DISPOSABLE) ×2 IMPLANT
GOWN STRL REUS W/TWL 2XL LVL3 (GOWN DISPOSABLE) IMPLANT
GOWN STRL REUS W/TWL LRG LVL3 (GOWN DISPOSABLE) ×2
GOWN STRL REUS W/TWL XL LVL3 (GOWN DISPOSABLE) ×1
KIT BASIN OR (CUSTOM PROCEDURE TRAY) ×1 IMPLANT
KIT TURNOVER KIT B (KITS) ×1 IMPLANT
NDL HYPO 22X1.5 SAFETY MO (MISCELLANEOUS) ×1 IMPLANT
NDL SPNL 22GX3.5 QUINCKE BK (NEEDLE) ×1 IMPLANT
NEEDLE HYPO 22X1.5 SAFETY MO (MISCELLANEOUS) ×1 IMPLANT
NEEDLE SPNL 22GX3.5 QUINCKE BK (NEEDLE) ×1 IMPLANT
NS IRRIG 1000ML POUR BTL (IV SOLUTION) ×1 IMPLANT
PACK LAMINECTOMY NEURO (CUSTOM PROCEDURE TRAY) ×1 IMPLANT
SPIKE FLUID TRANSFER (MISCELLANEOUS) ×1 IMPLANT
SPONGE SURGIFOAM ABS GEL SZ50 (HEMOSTASIS) ×1 IMPLANT
STRIP CLOSURE SKIN 1/2X4 (GAUZE/BANDAGES/DRESSINGS) ×1 IMPLANT
SUT VIC AB 0 CT1 18XCR BRD8 (SUTURE) ×1 IMPLANT
SUT VIC AB 0 CT1 8-18 (SUTURE) ×1
SUT VIC AB 2-0 CT1 18 (SUTURE) ×1 IMPLANT
SUT VICRYL 4-0 PS2 18IN ABS (SUTURE) ×1 IMPLANT
TOWEL GREEN STERILE (TOWEL DISPOSABLE) ×1 IMPLANT
TOWEL GREEN STERILE FF (TOWEL DISPOSABLE) ×1 IMPLANT
WATER STERILE IRR 1000ML POUR (IV SOLUTION) ×1 IMPLANT

## 2023-07-02 NOTE — Anesthesia Procedure Notes (Signed)
Procedure Name: Intubation Date/Time: 07/02/2023 2:09 PM  Performed by: Shary Decamp, CRNAPre-anesthesia Checklist: Patient identified, Patient being monitored, Timeout performed, Emergency Drugs available and Suction available Patient Re-evaluated:Patient Re-evaluated prior to induction Oxygen Delivery Method: Circle system utilized Preoxygenation: Pre-oxygenation with 100% oxygen Induction Type: IV induction Ventilation: Mask ventilation without difficulty Laryngoscope Size: Mac, 3 and Glidescope Grade View: Grade I Tube type: Oral Tube size: 7.5 mm Number of attempts: 1 Airway Equipment and Method: Rigid stylet Placement Confirmation: ETT inserted through vocal cords under direct vision, positive ETCO2 and breath sounds checked- equal and bilateral Secured at: 22 cm Tube secured with: Tape Dental Injury: Teeth and Oropharynx as per pre-operative assessment  Difficulty Due To: Difficult Airway- due to limited oral opening, Difficult Airway-  due to neck instability and Difficult Airway- due to reduced neck mobility

## 2023-07-02 NOTE — Transfer of Care (Signed)
Immediate Anesthesia Transfer of Care Note  Patient: Raymond Melton  Procedure(s) Performed: Microdiscectomy - left - Lumbar Four-Five/Lumbar Five-Sacral One (Left: Back)  Patient Location: PACU  Anesthesia Type:General  Level of Consciousness: awake, alert , and oriented  Airway & Oxygen Therapy: Patient Spontanous Breathing  Post-op Assessment: Report given to RN, Post -op Vital signs reviewed and stable, and Patient moving all extremities  Post vital signs: Reviewed and stable  Last Vitals:  Vitals Value Taken Time  BP 140/96 07/02/23 1621  Temp    Pulse 67 07/02/23 1625  Resp 14 07/02/23 1625  SpO2 96 % 07/02/23 1625  Vitals shown include unfiled device data.  Last Pain:  Vitals:   07/02/23 1145  TempSrc:   PainSc: 5       Patients Stated Pain Goal: 0 (07/02/23 1145)  Complications: No notable events documented.

## 2023-07-02 NOTE — Progress Notes (Signed)
Patient ID: Raymond Melton, male   DOB: [birth year], 67 y.o.   MRN: [id number] Patient complaining of increased numbness and heaviness in his foot postoperatively in recovery.  He had a baseline near complete foot drop it might be slightly worse however due to the calcified nature of both disc herniations and the degree of neural retraction manipulation I think this represents just radiculitis.  Will put him on steroids and watch him overnight.

## 2023-07-02 NOTE — Op Note (Signed)
Preoperative diagnosis: Back left leg pain from hernia nucleus pulposus L4-5 L5-S1 left with left L5 and S1 radiculopathies.  Postoperative diagnosis: Same.  Procedure: #1 lumbar limited to microdiscectomy L4-5 on the left with microscopic dissection of the left L5 nerve root microscopic discectomy.  2.  Lumbar laminectomy microdiscectomy L5-S1 on the left with microdissection of the left S1 nerve root microscopic discectomy.  Surgeon: Donalee Citrin.  Assistant: Clarisse Gouge.  Anesthesia: General.  EBL: Minimal.  HPI: 67 year old male with back and left leg pain workup revealed large disc radiations L4-5 L5-S1 on the left patient is has a chronic left-sided foot drop progressive worsening back and leg pain due to his failed conservative treatment imaging findings and progression of clinical syndrome I recommended laminectomy and microdiscectomy's at those 2 levels.  I extensively reviewed the risks and benefits of the operation with him as well as perioperative course expectations of outcome and alternatives to surgery and he understands and agrees to proceed forward.[name]  Operative procedure: Patient was brought into the OR was induced under general anesthesia positioned prone the Wilson frame his back was prepped and draped in routine sterile fashion.  Preoperative x-ray localized the appropriate level so after infiltration of 10 cc lidocaine with epi midline incision was made and Bovie electrocautery was used to take down the subcutaneous tissue and subperiosteal dissection was carried lamina of L4-L5 and S1 on the left side.  Intraoperative x-ray confirmed application for level so utilizing high-speed drill the inferior aspect of the lamina of L4 medial facet complex superior aspect of lamina L5 as well as inferior L5 medial facet and superior S1 was also drilled down laminotomies were begun with a 3 with a Kerrison punch ligament was identified and removed in piecemeal fashion exposing the thecal sac  at both levels.  Then under microscopic limitation first working at L5-S1 under bit the medial facet identified the S1 pedicle I could visualize directly the S2 nerve root as well as the S1 nerve root the S1 nerve was displaced laterally because a chronic partially calcified discrimination was presenting in the axilla.  I was able to remove some of this disc however a lot of it was stuck to the undersurface of the S1 nerve root as well as the thecal sac and was unable to be freed up so I had to leave that alone but I did a foraminotomy around it.  I then went to the space mobilize the S1 nerve and off the dead space cleaned out the space.  At the end of discectomy there was no further stenosis I was easily able to mobilize the S1 nerve root and pass it out the foramen.  This was packed away attention taken L4-5 in a similar fashion under biting of the medial facet allowed indication of a L5 nerve root which was dissected off of the large partially calcified discrimination at L4-5.  This was also incised very difficult to get out and also densely adherent and stuck to the undersurface of the dura freed up so was easily mobilizable however there was still some resistance with the space was cleaned out and there were no further fragments the L5 nerve root was easily mobilizable and able to be decompressed.  However due to the chronic nature of this creations partially calcified nature and how densely they were stuck to the undersurface thecal sac elected to leave those behind as I was unable to get those out completely.  There was however no further stenosis foraminally  or centrally at either level and aggressive foraminotomies were performed.  So these were both packed with Gelfoam copiously irrigated meticulous hemostasis was maintained the both wounds were closed with interrupted Vicryl and a running 4-0 subcuticular skin Dermabond benzoin Steri-Strips and sterile dressing was applied patient to cover him in stable  condition.  At the end the case all needle count sponge counts were correct.  My assistant Cala Bradford assisted with neuro retraction during the entire of the case.

## 2023-07-02 NOTE — H&P (Addendum)
Raymond Melton is an 67 y.o. male.   Chief Complaint: Back and left leg pain HPI: 67 year old gentleman with severe back and left leg pain Randon L5-S1 nerve root pattern workup revealed disc lesions L4-5 and L5-S1 left.  Due to the patient's progression of clinical syndrome imaging findings for a concert treatment I recommended laminectomy microdiscectomies at both those levels.  We extensively reviewed the risks and benefits of the operation with him as well as perioperative course expectations of outcome and alternatives to surgery he understood and agreed to proceed forward.  Past Medical History:  Diagnosis Date   Alcohol abuse    Altered mental status 12/17/2017   Anxiety    Arthritis    Chronic pain    Complication of anesthesia    Depression    Diabetes mellitus without complication (HCC)    GERD (gastroesophageal reflux disease)    Hepatitis C    History of kidney stones    Hypertension    Immune deficiency disorder (HCC)    PONV (postoperative nausea and vomiting)    TB lung, latent     Past Surgical History:  Procedure Laterality Date   ABDOMINAL SURGERY     ANKLE SURGERY     CHOLECYSTECTOMY     CORONARY PRESSURE/FFR STUDY N/A 02/05/2018   Procedure: INTRAVASCULAR PRESSURE WIRE/FFR STUDY;  Surgeon: Lyn Records, MD;  Location: [facility];  Service: Cardiovascular;  Laterality: N/A;   CORONARY STENT INTERVENTION N/A 02/05/2018   Procedure: CORONARY STENT INTERVENTION;  Surgeon: Lyn Records, MD;  Location: [facility];  Service: Cardiovascular;  Laterality: N/A;   ELBOW SURGERY     EYE SURGERY Left    HERNIA REPAIR     LEFT HEART CATH AND CORONARY ANGIOGRAPHY N/A 02/05/2018   Procedure: LEFT HEART CATH AND CORONARY ANGIOGRAPHY;  Surgeon: Lyn Records, MD;  Location: [facility];  Service: Cardiovascular;  Laterality: N/A;   NECK SURGERY     WRIST SURGERY      Family History  Problem Relation Age of Onset   Depression Mother     Hypertension Mother    Leukemia Mother    Colon polyps Mother    Stroke Father    Hypertension Father    Heart attack Father    Cancer Sister    Hypertension Sister    Cancer Brother        bladder   Hypertension Brother    Heart attack Brother    Social History:  reports that he quit smoking about 21 months ago. His smoking use included cigarettes. He has never used smokeless tobacco. He reports that he does not currently use alcohol. He reports that he does not use drugs.  Allergies:  Allergies  Allergen Reactions   Iodinated Contrast Media Anaphylaxis and Swelling   Iodine Anaphylaxis   Ioxaglate Anaphylaxis and Swelling   Metrizamide Anaphylaxis and Swelling   Other Anaphylaxis, Shortness Of Breath and Swelling    Iv dye   Codeine Itching and Other (See Comments)    Other reaction: restless   Iohexol Other (See Comments)   Latex Other (See Comments)    Unknown reaction   Lisinopril Other (See Comments)    Unknown reaction   Metformin Hcl Other (See Comments) and Diarrhea   Metoclopramide Other (See Comments)   Mirtazapine Other (See Comments)   Tuberculin Tests Other (See Comments)    Severe skin reaction   Tuberculin, Ppd Other (See Comments)  Other reaction: Severe skin reaction   Valsartan Other (See Comments)    Unknown reaction   Zolpidem Other (See Comments)    confusion    Medications Prior to Admission  Medication Sig Dispense Refill   ALPRAZolam (XANAX) 1 MG tablet Take 1 mg by mouth 3 (three) times daily as needed for anxiety or sleep.     amLODipine (NORVASC) 10 MG tablet Take 10 mg by mouth daily.     aspirin EC 81 MG tablet Take 81 mg by mouth daily. Swallow whole.     calcium carbonate (TUMS EX) 750 MG chewable tablet Chew 2 tablets by mouth daily as needed for heartburn.     clopidogrel (PLAVIX) 75 MG tablet Take 1 tablet (75 mg total) by mouth daily. 90 tablet 3   Dulaglutide 3 MG/0.5ML SOPN Inject 3 mg as directed once a week. trulicity      ibuprofen (ADVIL) 400 MG tablet Take 400 mg by mouth every 6 (six) hours as needed for mild pain or moderate pain.     linaclotide (LINZESS) 72 MCG capsule Take 72 mcg by mouth daily before breakfast.     Melatonin 10 MG TABS Take 10 mg by mouth at bedtime as needed (Sleep).     metoprolol succinate (TOPROL-XL) 100 MG 24 hr tablet Take 1 tablet (100 mg total) by mouth daily. Take with or immediately following a meal: For high blood pressure     nitroGLYCERIN (NITROSTAT) 0.4 MG SL tablet Place 1 tablet (0.4 mg total) under the tongue every 5 (five) minutes x 3 doses as needed for chest pain. 30 tablet 12   rosuvastatin (CRESTOR) 20 MG tablet Take 1 tablet (20 mg total) by mouth daily. 90 tablet 3   tiZANidine (ZANAFLEX) 4 MG tablet Take 4 mg by mouth 3 (three) times daily as needed for muscle spasms.     AMBULATORY NON FORMULARY MEDICATION Medication Name:90 ml 2% Lidocaine/90 ml Dicyclomine 10 mg/38ml/270 ml Maalox - Take 5-10 ml every 4-6 hours as needed. (Patient not taking: Reported on 06/27/2023) 450 mL 1    No results found for this or any previous visit (from the past 48 hour(s)). No results found.  Review of Systems  Musculoskeletal:  Positive for back pain.  Neurological:  Positive for numbness.    Blood pressure 131/85, pulse 64, temperature 98.1 F (36.7 C), temperature source Oral, resp. rate 17, height 6\' 1"  (1.854 m), weight 83.9 kg, SpO2 94%. Physical Exam HENT:     Head: Normocephalic.     Right Ear: Tympanic membrane normal.     Nose: Nose normal.     Mouth/Throat:     Mouth: Mucous membranes are moist.  Eyes:     Pupils: Pupils are equal, round, and reactive to light.  Cardiovascular:     Rate and Rhythm: Normal rate.  Pulmonary:     Effort: Pulmonary effort is normal.  Abdominal:     General: Abdomen is flat.  Neurological:     Mental Status: He is alert.     Comments: Strength 5 out of 5 iliopsoas, quads, hamstrings, gastroc, into tibialis, and EHL except baseline  weakness left foot with a foot drop 2/5      Assessment/Plan 67 year old presents for left-sided L4-5 L5-S1 microdiscectomies  Mariam Dollar, MD 07/02/2023, 11:28 AM

## 2023-07-03 ENCOUNTER — Encounter (HOSPITAL_COMMUNITY): Payer: Self-pay | Admitting: Neurosurgery

## 2023-07-03 DIAGNOSIS — M5117 Intervertebral disc disorders with radiculopathy, lumbosacral region: Secondary | ICD-10-CM | POA: Diagnosis not present

## 2023-07-03 MED ORDER — CYCLOBENZAPRINE HCL 10 MG PO TABS: 10.0000 mg | ORAL_TABLET | Freq: Three times a day (TID) | ORAL | 0 refills | Status: DC | PRN

## 2023-07-03 MED ORDER — HYDROCODONE-ACETAMINOPHEN 5-325 MG PO TABS
1.0000 | ORAL_TABLET | Freq: Four times a day (QID) | ORAL | Status: DC | PRN
Start: 2023-07-03 — End: 2023-07-17

## 2023-07-03 MED ORDER — HYDROCODONE-ACETAMINOPHEN 5-325 MG PO TABS: 1.0000 | ORAL_TABLET | Freq: Four times a day (QID) | ORAL | Status: DC | PRN

## 2023-07-03 MED ORDER — METHYLPREDNISOLONE 4 MG PO TBPK: ORAL_TABLET | ORAL | 0 refills | Status: AC

## 2023-07-03 MED FILL — Thrombin For Soln 5000 Unit: CUTANEOUS | Qty: 2 | Status: AC

## 2023-07-03 NOTE — Evaluation (Signed)
Occupational Therapy Evaluation Patient Details Name: Raymond Melton MRN: [id number] DOB: 1956-01-06 Today's Date: 07/03/2023   History of Present Illness 67 yo M s/p left laminectomy L4-5, L5-S1.  PMH includes: depression, anxiety, prior C-spine surgery.   Clinical Impression   Patient s/p above procedure.  PTA he lives at home with his spouse, who can assist if needed.  Patient is currently needing generalized supervision for lower body ADL and mobility at RW level.  All precautions reviewed, questions answered and no further OT needs in the acute setting.  Patient wanting to order an AFO on his own, order for AFO remains.  Patient with good understanding of all precautions.         If plan is discharge home, recommend the following: Assist for transportation;Assistance with cooking/housework    Functional Status Assessment  Patient has had a recent decline in their functional status and demonstrates the ability to make significant improvements in function in a reasonable and predictable amount of time.  Equipment Recommendations  None recommended by OT    Recommendations for Other Services       Precautions / Restrictions Precautions Precautions: Back Precaution Booklet Issued: Yes (comment) Precaution Comments: Minimize bending, lifting and twisting. Restrictions Weight Bearing Restrictions: No      Mobility Bed Mobility Overal bed mobility: Modified Independent                  Transfers Overall transfer level: Needs assistance Equipment used: Rolling walker (2 wheels) Transfers: Sit to/from Stand, Bed to chair/wheelchair/BSC Sit to Stand: Modified independent (Device/Increase time)     Step pivot transfers: Supervision            Balance Overall balance assessment: Needs assistance Sitting-balance support: Feet supported Sitting balance-Leahy Scale: Normal     Standing balance support: Reliant on assistive device for balance Standing  balance-Leahy Scale: Fair                             ADL either performed or assessed with clinical judgement   ADL Overall ADL's : At baseline                                             Vision Baseline Vision/History: 1 Wears glasses Patient Visual Report: No change from baseline       Perception Perception: Within Functional Limits       Praxis Praxis: WFL       Pertinent Vitals/Pain Pain Assessment Pain Assessment: Faces Faces Pain Scale: Hurts little more Pain Location: L leg radicular pain Pain Descriptors / Indicators: Tender, Burning, Aching, Nagging Pain Intervention(s): Monitored during session     Extremity/Trunk Assessment Upper Extremity Assessment Upper Extremity Assessment: Overall WFL for tasks assessed   Lower Extremity Assessment Lower Extremity Assessment: LLE deficits/detail LLE Deficits / Details: knee buckle and foot drop LLE Sensation: decreased light touch LLE Coordination: decreased gross motor   Cervical / Trunk Assessment Cervical / Trunk Assessment: Back Surgery   Communication Communication Communication: No apparent difficulties   Cognition Arousal: Alert Behavior During Therapy: WFL for tasks assessed/performed Overall Cognitive Status: Within Functional Limits for tasks assessed  General Comments   VSS    Exercises     Shoulder Instructions      Home Living Family/patient expects to be discharged to:: Private residence Living Arrangements: Spouse/significant other   Type of Home: Apartment Home Access: Elevator     Home Layout: One level     Bathroom Shower/Tub: Chief Strategy Officer: Standard Bathroom Accessibility: Yes How Accessible: Accessible via walker Home Equipment: Rollator (4 wheels);Shower seat;Cane - single point          Prior Functioning/Environment Prior Level of Function : Independent/Modified  Independent                        OT Problem List: Impaired balance (sitting and/or standing)      OT Treatment/Interventions:      OT Goals(Current goals can be found in the care plan section) Acute Rehab OT Goals Patient Stated Goal: Retun home OT Goal Formulation: With patient Time For Goal Achievement: 07/04/23 Potential to Achieve Goals: Good  OT Frequency:      Co-evaluation              AM-PAC OT "6 Clicks" Daily Activity     Outcome Measure Help from another person eating meals?: None Help from another person taking care of personal grooming?: None Help from another person toileting, which includes using toliet, bedpan, or urinal?: None Help from another person bathing (including washing, rinsing, drying)?: A Little Help from another person to put on and taking off regular upper body clothing?: None Help from another person to put on and taking off regular lower body clothing?: A Little 6 Click Score: 22   End of Session Equipment Utilized During Treatment: Rolling walker (2 wheels) Nurse Communication: Mobility status  Activity Tolerance: Patient tolerated treatment well Patient left: in chair;with call bell/phone within reach;with family/visitor present  OT Visit Diagnosis: Unsteadiness on feet (R26.81)                Time: 4098-1191 OT Time Calculation (min): 28 min Charges:  OT General Charges $OT Visit: 1 Visit OT Evaluation $OT Eval Moderate Complexity: 1 Mod OT Treatments $Self Care/Home Management : 8-22 mins  07/03/2023  RP, OTR/L  Acute Rehabilitation Services  Office:  [phone]   Suzanna Obey 07/03/2023, 9:02 AM

## 2023-07-03 NOTE — Progress Notes (Signed)
Orthopedic Tech Progress Note Patient Details:  Anakin Nolette [facility] [birth year] [id number] Patient did not want AFO consult. Said he would handle it when he got home Patient ID: Adir Gunnett, male   DOB: 12-20-1955, 67 y.o.   MRN: [id number]  Lovett Calender 07/03/2023, 8:29 AM

## 2023-07-03 NOTE — Progress Notes (Signed)
Patient alert and oriented, void, ambulate. Surgical site clean and dry no sign of infection. D/c instructions explain and given all questions answered. Pt. D/c home per order.

## 2023-07-03 NOTE — Discharge Summary (Addendum)
Physician Discharge Summary  Patient ID: Raymond Melton MRN: [id number] DOB/AGE: 68-Aug-1957 68 y.o.  Admit date: 07/02/2023 Discharge date: 07/03/2023  Admission Diagnoses:  Back left leg pain from hernia nucleus pulposus L4-5 L5-S1 left with left L5 and S1 radiculopathies.    Discharge Diagnoses: same   Discharged Condition: good  Hospital Course: The patient was admitted on 07/02/2023 and taken to the operating room where the patient underwent left laminectomy L4-5, L5-S1. The patient tolerated the procedure well and was taken to the recovery room and then to the floor in stable condition. The hospital course was routine. There were no complications. The wound remained clean dry and intact. Pt had appropriate back soreness. No complaints of leg pain or new N/T/W. The patient remained afebrile with stable vital signs, and tolerated a regular diet. The patient continued to increase activities, and pain was well controlled with oral pain medications.   Consults: None  Significant Diagnostic Studies:  Results for orders placed or performed during the hospital encounter of 07/02/23  Surgical pcr screen   Specimen: Nasal Mucosa; Nasal Swab  Result Value Ref Range   MRSA, PCR NEGATIVE NEGATIVE   Staphylococcus aureus NEGATIVE NEGATIVE  CBC per protocol  Result Value Ref Range   WBC 5.2 4.0 - 10.5 K/uL   RBC 5.04 4.22 - 5.81 MIL/uL   Hemoglobin 15.6 13.0 - 17.0 g/dL   HCT 56.3 87.5 - 64.3 %   MCV 90.7 80.0 - 100.0 fL   MCH 31.0 26.0 - 34.0 pg   MCHC 34.1 30.0 - 36.0 g/dL   RDW 32.9 51.8 - 84.1 %   Platelets 202 150 - 400 K/uL   nRBC 0.0 0.0 - 0.2 %  Comprehensive metabolic panel per protocol  Result Value Ref Range   Sodium 134 (L) 135 - 145 mmol/L   Potassium 4.1 3.5 - 5.1 mmol/L   Chloride 98 98 - 111 mmol/L   CO2 23 22 - 32 mmol/L   Glucose, Bld 118 (H) 70 - 99 mg/dL   BUN 18 8 - 23 mg/dL   Creatinine, Ser 6.60 0.61 - 1.24 mg/dL   Calcium 9.6 8.9 - 63.0 mg/dL   Total  Protein 7.6 6.5 - 8.1 g/dL   Albumin 4.5 3.5 - 5.0 g/dL   AST 34 15 - 41 U/L   ALT 68 (H) 0 - 44 U/L   Alkaline Phosphatase 110 38 - 126 U/L   Total Bilirubin 0.6 0.3 - 1.2 mg/dL   GFR, Estimated >16 >01 mL/min   Anion gap 13 5 - 15  Glucose, capillary  Result Value Ref Range   Glucose-Capillary 135 (H) 70 - 99 mg/dL   Comment 1 Notify RN    Comment 2 Document in Chart   Glucose, capillary  Result Value Ref Range   Glucose-Capillary 121 (H) 70 - 99 mg/dL  Glucose, capillary  Result Value Ref Range   Glucose-Capillary 187 (H) 70 - 99 mg/dL    DG Lumbar Spine 2-3 Views  Result Date: 07/02/2023 CLINICAL DATA:  Left lumbar micro discectomy at L4-5 and L5-S1. EXAM: LUMBAR SPINE - 2-3 VIEW COMPARISON:  Radiographs 03/19/2022.  MRI 06/17/2023. FINDINGS: Two cross-table lateral views of the lumbar spine are submitted postoperatively from the operating room. The 1st image at 1426 hours demonstrates a posterior localizing needle over the L4 spinous process. The 2nd image at 1439 hours demonstrates skin spreaders overlying the posterior elements at L5. There is a surgical instrument directed towards the posterior aspect of  the L4-5 disc space. IMPRESSION: Intraoperative localizing views of the lumbar spine as described. Electronically Signed   By: Carey Bullocks M.D.   On: 07/02/2023 18:47    Antibiotics:  Anti-infectives (From admission, onward)    Start     Dose/Rate Route Frequency Ordered Stop   07/02/23 1900  ceFAZolin (ANCEF) IVPB 2g/100 mL premix        2 g 200 mL/hr over 30 Minutes Intravenous Every 8 hours 07/02/23 1802 07/03/23 0806   07/02/23 1130  ceFAZolin (ANCEF) IVPB 2g/100 mL premix        2 g 200 mL/hr over 30 Minutes Intravenous On call to O.R. 07/02/23 1117 07/02/23 1430       Discharge Exam: Blood pressure 124/71, pulse 99, temperature 97.8 F (36.6 C), temperature source Oral, resp. rate 18, height 6\' 1"  (1.854 m), weight 83.9 kg, SpO2 96%. Neurologic: Grossly  normal Ambulating and voiding well, incision cdi, left foot drop baseline  Discharge Medications:   Allergies as of 07/03/2023       Reactions   Iodinated Contrast Media Anaphylaxis, Swelling   Iodine Anaphylaxis   Ioxaglate Anaphylaxis, Swelling   Metrizamide Anaphylaxis, Swelling   Other Anaphylaxis, Shortness Of Breath, Swelling   Iv dye   Codeine Itching, Other (See Comments)   Other reaction: restless   Iohexol Other (See Comments)   Latex Other (See Comments)   Unknown reaction   Lisinopril Other (See Comments)   Unknown reaction   Metformin Hcl Other (See Comments), Diarrhea   Metoclopramide Other (See Comments)   Mirtazapine Other (See Comments)   Tuberculin Tests Other (See Comments)   Severe skin reaction   Tuberculin, Ppd Other (See Comments)   Other reaction: Severe skin reaction   Valsartan Other (See Comments)   Unknown reaction   Zolpidem Other (See Comments)   confusion        Medication List     STOP taking these medications    clopidogrel 75 MG tablet Commonly known as: PLAVIX       TAKE these medications    ALPRAZolam 1 MG tablet Commonly known as: XANAX Take 1 mg by mouth 3 (three) times daily as needed for anxiety or sleep.   AMBULATORY NON FORMULARY MEDICATION Medication Name:90 ml 2% Lidocaine/90 ml Dicyclomine 10 mg/21ml/270 ml Maalox - Take 5-10 ml every 4-6 hours as needed.   amLODipine 10 MG tablet Commonly known as: NORVASC Take 10 mg by mouth daily.   aspirin EC 81 MG tablet Take 81 mg by mouth daily. Swallow whole.   calcium carbonate 750 MG chewable tablet Commonly known as: TUMS EX Chew 2 tablets by mouth daily as needed for heartburn.   cyclobenzaprine 10 MG tablet Commonly known as: FLEXERIL Take 1 tablet (10 mg total) by mouth 3 (three) times daily as needed for muscle spasms.   Dulaglutide 3 MG/0.5ML Sopn Inject 3 mg as directed once a week. trulicity   ibuprofen 400 MG tablet Commonly known as: ADVIL Take 400  mg by mouth every 6 (six) hours as needed for mild pain or moderate pain.   linaclotide 72 MCG capsule Commonly known as: LINZESS Take 72 mcg by mouth daily before breakfast.   Melatonin 10 MG Tabs Take 10 mg by mouth at bedtime as needed (Sleep).   methylPREDNISolone 4 MG Tbpk tablet Commonly known as: MEDROL DOSEPAK Take as directed   metoprolol succinate 100 MG 24 hr tablet Commonly known as: TOPROL-XL Take 1 tablet (100 mg total) by mouth  daily. Take with or immediately following a meal: For high blood pressure   nitroGLYCERIN 0.4 MG SL tablet Commonly known as: NITROSTAT Place 1 tablet (0.4 mg total) under the tongue every 5 (five) minutes x 3 doses as needed for chest pain.   rosuvastatin 20 MG tablet Commonly known as: CRESTOR Take 1 tablet (20 mg total) by mouth daily.   tiZANidine 4 MG tablet Commonly known as: ZANAFLEX Take 4 mg by mouth 3 (three) times daily as needed for muscle spasms.        Disposition: home   Final Dx: left laminectomy L4-5, L5-S1  Discharge Instructions      Remove dressing in 72 hours   Complete by: As directed    Call MD for:  difficulty breathing, headache or visual disturbances   Complete by: As directed    Call MD for:  extreme fatigue   Complete by: As directed    Call MD for:  hives   Complete by: As directed    Call MD for:  persistant dizziness or light-headedness   Complete by: As directed    Call MD for:  persistant nausea and vomiting   Complete by: As directed    Call MD for:  redness, tenderness, or signs of infection (pain, swelling, redness, odor or green/yellow discharge around incision site)   Complete by: As directed    Call MD for:  severe uncontrolled pain   Complete by: As directed    Call MD for:  temperature >100.4   Complete by: As directed    Diet - low sodium heart healthy   Complete by: As directed    Driving Restrictions   Complete by: As directed    No driving for 2 weeks, no riding in the car  for 1 week   Increase activity slowly   Complete by: As directed    Lifting restrictions   Complete by: As directed    No lifting more than 8 lbs          Signed: Mariam Dollar 07/03/2023, 8:21 AM

## 2023-07-03 NOTE — Plan of Care (Signed)
°  Problem: Education: °Goal: Ability to verbalize activity precautions or restrictions will improve °Outcome: Completed/Met °Goal: Knowledge of the prescribed therapeutic regimen will improve °Outcome: Completed/Met °Goal: Understanding of discharge needs will improve °Outcome: Completed/Met °  °Problem: Activity: °Goal: Ability to avoid complications of mobility impairment will improve °Outcome: Completed/Met °Goal: Ability to tolerate increased activity will improve °Outcome: Completed/Met °Goal: Will remain free from falls °Outcome: Completed/Met °  °Problem: Bowel/Gastric: °Goal: Gastrointestinal status for postoperative course will improve °Outcome: Completed/Met °  °Problem: Clinical Measurements: °Goal: Ability to maintain clinical measurements within normal limits will improve °Outcome: Completed/Met °Goal: Postoperative complications will be avoided or minimized °Outcome: Completed/Met °Goal: Diagnostic test results will improve °Outcome: Completed/Met °  °Problem: Pain Management: °Goal: Pain level will decrease °Outcome: Completed/Met °  °Problem: Skin Integrity: °Goal: Will show signs of wound healing °Outcome: Completed/Met °  °Problem: Health Behavior/Discharge Planning: °Goal: Identification of resources available to assist in meeting health care needs will improve °Outcome: Completed/Met °  °Problem: Bladder/Genitourinary: °Goal: Urinary functional status for postoperative course will improve °Outcome: Completed/Met °  °

## 2023-07-04 NOTE — Anesthesia Postprocedure Evaluation (Signed)
Anesthesia Post Note  Patient: Raymond Melton  Procedure(s) Performed: Microdiscectomy - left - Lumbar Four-Five/Lumbar Five-Sacral One (Left: Back)     Patient location during evaluation: PACU Anesthesia Type: General Level of consciousness: awake and alert Pain management: pain level controlled Vital Signs Assessment: post-procedure vital signs reviewed and stable Respiratory status: spontaneous breathing, nonlabored ventilation, respiratory function stable and patient connected to nasal cannula oxygen Cardiovascular status: blood pressure returned to baseline and stable Postop Assessment: no apparent nausea or vomiting Anesthetic complications: no   No notable events documented.  Last Vitals:  Vitals:   07/03/23 0440 07/03/23 0731  BP: 137/77 124/71  Pulse: (!) 102 99  Resp: 20 18  Temp: 36.7 C 36.6 C  SpO2: 98% 96%    Last Pain:  Vitals:   07/03/23 0731  TempSrc: Oral  PainSc:                   

## 2023-07-11 ENCOUNTER — Emergency Department (HOSPITAL_COMMUNITY): Payer: Medicare Other

## 2023-07-11 ENCOUNTER — Other Ambulatory Visit: Payer: Self-pay

## 2023-07-11 ENCOUNTER — Encounter (HOSPITAL_COMMUNITY)
Admission: EM | Disposition: A | Discharge status: Home Health | Payer: Self-pay | Source: Home / Self Care | Attending: Family Medicine

## 2023-07-11 ENCOUNTER — Observation Stay (HOSPITAL_COMMUNITY): Payer: Medicare Other

## 2023-07-11 ENCOUNTER — Inpatient Hospital Stay (HOSPITAL_COMMUNITY)
Admission: EM | Admit: 2023-07-11 | Discharge: 2023-07-17 | DRG: 940 | Disposition: A | Discharge status: Home Health | Payer: Medicare Other | Attending: Internal Medicine | Admitting: Internal Medicine

## 2023-07-11 ENCOUNTER — Encounter (HOSPITAL_COMMUNITY): Payer: Self-pay

## 2023-07-11 DIAGNOSIS — F3160 Bipolar disorder, current episode mixed, unspecified: Secondary | ICD-10-CM | POA: Diagnosis present

## 2023-07-11 DIAGNOSIS — F064 Anxiety disorder due to known physiological condition: Secondary | ICD-10-CM | POA: Diagnosis present

## 2023-07-11 DIAGNOSIS — I2510 Atherosclerotic heart disease of native coronary artery without angina pectoris: Secondary | ICD-10-CM | POA: Diagnosis present

## 2023-07-11 DIAGNOSIS — Z887 Allergy status to serum and vaccine status: Secondary | ICD-10-CM

## 2023-07-11 DIAGNOSIS — G4700 Insomnia, unspecified: Secondary | ICD-10-CM | POA: Diagnosis present

## 2023-07-11 DIAGNOSIS — Z7985 Long-term (current) use of injectable non-insulin antidiabetic drugs: Secondary | ICD-10-CM

## 2023-07-11 DIAGNOSIS — E782 Mixed hyperlipidemia: Secondary | ICD-10-CM

## 2023-07-11 DIAGNOSIS — K219 Gastro-esophageal reflux disease without esophagitis: Secondary | ICD-10-CM | POA: Diagnosis present

## 2023-07-11 DIAGNOSIS — Z87891 Personal history of nicotine dependence: Secondary | ICD-10-CM

## 2023-07-11 DIAGNOSIS — G8918 Other acute postprocedural pain: Secondary | ICD-10-CM | POA: Diagnosis not present

## 2023-07-11 DIAGNOSIS — Z7982 Long term (current) use of aspirin: Secondary | ICD-10-CM | POA: Diagnosis not present

## 2023-07-11 DIAGNOSIS — G8929 Other chronic pain: Secondary | ICD-10-CM | POA: Diagnosis present

## 2023-07-11 DIAGNOSIS — M21372 Foot drop, left foot: Secondary | ICD-10-CM | POA: Diagnosis present

## 2023-07-11 DIAGNOSIS — F4310 Post-traumatic stress disorder, unspecified: Secondary | ICD-10-CM | POA: Diagnosis present

## 2023-07-11 DIAGNOSIS — Z818 Family history of other mental and behavioral disorders: Secondary | ICD-10-CM

## 2023-07-11 DIAGNOSIS — M5416 Radiculopathy, lumbar region: Secondary | ICD-10-CM | POA: Diagnosis present

## 2023-07-11 DIAGNOSIS — M549 Dorsalgia, unspecified: Secondary | ICD-10-CM

## 2023-07-11 DIAGNOSIS — M48061 Spinal stenosis, lumbar region without neurogenic claudication: Secondary | ICD-10-CM | POA: Diagnosis present

## 2023-07-11 DIAGNOSIS — E119 Type 2 diabetes mellitus without complications: Secondary | ICD-10-CM | POA: Diagnosis present

## 2023-07-11 DIAGNOSIS — R45851 Suicidal ideations: Secondary | ICD-10-CM | POA: Diagnosis present

## 2023-07-11 DIAGNOSIS — Z955 Presence of coronary angioplasty implant and graft: Secondary | ICD-10-CM

## 2023-07-11 DIAGNOSIS — F3162 Bipolar disorder, current episode mixed, moderate: Secondary | ICD-10-CM

## 2023-07-11 DIAGNOSIS — Z8615 Personal history of latent tuberculosis infection: Secondary | ICD-10-CM | POA: Diagnosis not present

## 2023-07-11 DIAGNOSIS — Z79899 Other long term (current) drug therapy: Secondary | ICD-10-CM

## 2023-07-11 DIAGNOSIS — E0859 Diabetes mellitus due to underlying condition with other circulatory complications: Secondary | ICD-10-CM | POA: Diagnosis not present

## 2023-07-11 DIAGNOSIS — Z885 Allergy status to narcotic agent status: Secondary | ICD-10-CM

## 2023-07-11 DIAGNOSIS — I10 Essential (primary) hypertension: Secondary | ICD-10-CM | POA: Diagnosis not present

## 2023-07-11 DIAGNOSIS — F419 Anxiety disorder, unspecified: Secondary | ICD-10-CM | POA: Insufficient documentation

## 2023-07-11 DIAGNOSIS — Z9049 Acquired absence of other specified parts of digestive tract: Secondary | ICD-10-CM

## 2023-07-11 DIAGNOSIS — M5116 Intervertebral disc disorders with radiculopathy, lumbar region: Secondary | ICD-10-CM | POA: Diagnosis not present

## 2023-07-11 DIAGNOSIS — Z888 Allergy status to other drugs, medicaments and biological substances status: Secondary | ICD-10-CM

## 2023-07-11 DIAGNOSIS — M79673 Pain in unspecified foot: Secondary | ICD-10-CM | POA: Diagnosis present

## 2023-07-11 DIAGNOSIS — I25110 Atherosclerotic heart disease of native coronary artery with unstable angina pectoris: Secondary | ICD-10-CM

## 2023-07-11 DIAGNOSIS — R7401 Elevation of levels of liver transaminase levels: Secondary | ICD-10-CM | POA: Diagnosis present

## 2023-07-11 DIAGNOSIS — F411 Generalized anxiety disorder: Secondary | ICD-10-CM | POA: Insufficient documentation

## 2023-07-11 DIAGNOSIS — I25750 Atherosclerosis of native coronary artery of transplanted heart with unstable angina: Secondary | ICD-10-CM | POA: Diagnosis not present

## 2023-07-11 DIAGNOSIS — Z91041 Radiographic dye allergy status: Secondary | ICD-10-CM

## 2023-07-11 DIAGNOSIS — Z91040 Latex allergy status: Secondary | ICD-10-CM

## 2023-07-11 DIAGNOSIS — Z8249 Family history of ischemic heart disease and other diseases of the circulatory system: Secondary | ICD-10-CM

## 2023-07-11 DIAGNOSIS — R29810 Facial weakness: Secondary | ICD-10-CM | POA: Diagnosis present

## 2023-07-11 DIAGNOSIS — I700 Atherosclerosis of aorta: Secondary | ICD-10-CM | POA: Diagnosis present

## 2023-07-11 DIAGNOSIS — E785 Hyperlipidemia, unspecified: Secondary | ICD-10-CM | POA: Diagnosis present

## 2023-07-11 DIAGNOSIS — F1011 Alcohol abuse, in remission: Secondary | ICD-10-CM | POA: Diagnosis not present

## 2023-07-11 HISTORY — PX: LUMBAR LAMINECTOMY/DECOMPRESSION MICRODISCECTOMY: SHX5026

## 2023-07-11 LAB — COMPREHENSIVE METABOLIC PANEL
ALT: 121 U/L — ABNORMAL HIGH (ref 0–44)
AST: 40 U/L (ref 15–41)
Albumin: 3.8 g/dL (ref 3.5–5.0)
Alkaline Phosphatase: 114 U/L (ref 38–126)
Anion gap: 11 (ref 5–15)
BUN: 21 mg/dL (ref 8–23)
CO2: 24 mmol/L (ref 22–32)
Calcium: 9.4 mg/dL (ref 8.9–10.3)
Chloride: 99 mmol/L (ref 98–111)
Creatinine, Ser: 0.8 mg/dL (ref 0.61–1.24)
GFR, Estimated: >60 mL/min (ref >60)
Glucose, Bld: 163 mg/dL — ABNORMAL HIGH (ref 70–99)
Potassium: 4 mmol/L (ref 3.5–5.1)
Sodium: 134 mmol/L — ABNORMAL LOW (ref 135–145)
Total Bilirubin: 0.7 mg/dL (ref 0.3–1.2)
Total Protein: 7 g/dL (ref 6.5–8.1)

## 2023-07-11 LAB — CBC WITH DIFFERENTIAL/PLATELET
Abs Immature Granulocytes: 0.52 10*3/uL — ABNORMAL HIGH (ref 0.00–0.07)
Basophils Absolute: 0.1 10*3/uL (ref 0.0–0.1)
Basophils Relative: 1 %
Eosinophils Absolute: 0.2 10*3/uL (ref 0.0–0.5)
Eosinophils Relative: 2 %
HCT: 43.8 % (ref 39.0–52.0)
Hemoglobin: 15.3 g/dL (ref 13.0–17.0)
Immature Granulocytes: 7 %
Lymphocytes Relative: 24 %
Lymphs Abs: 1.8 10*3/uL (ref 0.7–4.0)
MCH: 31 pg (ref 26.0–34.0)
MCHC: 34.9 g/dL (ref 30.0–36.0)
MCV: 88.7 fL (ref 80.0–100.0)
Monocytes Absolute: 0.6 10*3/uL (ref 0.1–1.0)
Monocytes Relative: 9 %
Neutro Abs: 4.2 10*3/uL (ref 1.7–7.7)
Neutrophils Relative %: 57 %
Platelets: 244 10*3/uL (ref 150–400)
RBC: 4.94 MIL/uL (ref 4.22–5.81)
RDW: 12.4 % (ref 11.5–15.5)
WBC: 7.4 10*3/uL (ref 4.0–10.5)
nRBC: 0 % (ref 0.0–0.2)

## 2023-07-11 LAB — HIV ANTIBODY (ROUTINE TESTING W REFLEX): HIV Screen 4th Generation wRfx: NONREACTIVE

## 2023-07-11 LAB — GLUCOSE, CAPILLARY: Glucose-Capillary: 218 mg/dL — ABNORMAL HIGH (ref 70–99)

## 2023-07-11 SURGERY — LUMBAR LAMINECTOMY/DECOMPRESSION MICRODISCECTOMY 1 LEVEL
Anesthesia: General

## 2023-07-11 MED ORDER — PHENYLEPHRINE 80 MCG/ML (10ML) SYRINGE FOR IV PUSH (FOR BLOOD PRESSURE SUPPORT)
PREFILLED_SYRINGE | INTRAVENOUS | Status: DC | PRN
  Administered 2023-07-11: 80 ug via INTRAVENOUS

## 2023-07-11 MED ORDER — FENTANYL CITRATE (PF) 250 MCG/5ML IJ SOLN
INTRAMUSCULAR | Status: AC
  Filled 2023-07-11: qty 5

## 2023-07-11 MED ORDER — METHOCARBAMOL 500 MG PO TABS
500.0000 mg | ORAL_TABLET | Freq: Once | ORAL | Status: AC
  Administered 2023-07-11: 500 mg via ORAL
  Filled 2023-07-11: qty 1

## 2023-07-11 MED ORDER — INSULIN ASPART 100 UNIT/ML IJ SOLN
0.0000 [IU] | Freq: Three times a day (TID) | INTRAMUSCULAR | Status: DC
  Administered 2023-07-12: 3 [IU] via SUBCUTANEOUS
  Administered 2023-07-12 (×2): 5 [IU] via SUBCUTANEOUS
  Administered 2023-07-13: 3 [IU] via SUBCUTANEOUS
  Administered 2023-07-13: 7 [IU] via SUBCUTANEOUS
  Administered 2023-07-13: 3 [IU] via SUBCUTANEOUS
  Administered 2023-07-14 (×2): 2 [IU] via SUBCUTANEOUS
  Administered 2023-07-14: 5 [IU] via SUBCUTANEOUS
  Administered 2023-07-15: 3 [IU] via SUBCUTANEOUS
  Administered 2023-07-15: 5 [IU] via SUBCUTANEOUS
  Administered 2023-07-15: 7 [IU] via SUBCUTANEOUS
  Administered 2023-07-16 – 2023-07-17 (×4): 3 [IU] via SUBCUTANEOUS

## 2023-07-11 MED ORDER — FENTANYL CITRATE (PF) 100 MCG/2ML IJ SOLN
INTRAMUSCULAR | Status: AC
  Filled 2023-07-11: qty 2

## 2023-07-11 MED ORDER — NALOXONE HCL 0.4 MG/ML IJ SOLN: 0.4000 mg | INTRAMUSCULAR | Status: DC | PRN

## 2023-07-11 MED ORDER — HYDROMORPHONE HCL 1 MG/ML IJ SOLN
1.0000 mg | Freq: Once | INTRAMUSCULAR | Status: AC
  Administered 2023-07-11: 1 mg via INTRAVENOUS
  Filled 2023-07-11: qty 1

## 2023-07-11 MED ORDER — LORAZEPAM 0.5 MG PO TABS: 0.5000 mg | ORAL_TABLET | Freq: Every day | ORAL | Status: DC | PRN

## 2023-07-11 MED ORDER — OXYCODONE-ACETAMINOPHEN 5-325 MG PO TABS
1.0000 | ORAL_TABLET | Freq: Four times a day (QID) | ORAL | Status: DC | PRN
  Administered 2023-07-11: 1 via ORAL
  Filled 2023-07-11 (×2): qty 1

## 2023-07-11 MED ORDER — METOPROLOL SUCCINATE ER 50 MG PO TB24
100.0000 mg | ORAL_TABLET | Freq: Every day | ORAL | Status: DC
  Administered 2023-07-12 – 2023-07-17 (×6): 100 mg via ORAL
  Filled 2023-07-11 (×6): qty 2

## 2023-07-11 MED ORDER — ONDANSETRON HCL 4 MG PO TABS
4.0000 mg | ORAL_TABLET | Freq: Four times a day (QID) | ORAL | Status: DC | PRN
  Administered 2023-07-11: 4 mg via ORAL
  Filled 2023-07-11 (×3): qty 1

## 2023-07-11 MED ORDER — METHYLPREDNISOLONE 4 MG PO TBPK: ORAL_TABLET | ORAL | Status: DC

## 2023-07-11 MED ORDER — SUCCINYLCHOLINE CHLORIDE 200 MG/10ML IV SOSY
PREFILLED_SYRINGE | INTRAVENOUS | Status: AC
  Filled 2023-07-11: qty 10

## 2023-07-11 MED ORDER — AMLODIPINE BESYLATE 10 MG PO TABS
10.0000 mg | ORAL_TABLET | Freq: Every day | ORAL | Status: DC
  Administered 2023-07-12 – 2023-07-17 (×6): 10 mg via ORAL
  Filled 2023-07-11 (×6): qty 1

## 2023-07-11 MED ORDER — LINACLOTIDE 72 MCG PO CAPS
72.0000 ug | ORAL_CAPSULE | Freq: Every day | ORAL | Status: DC | PRN
  Administered 2023-07-13 – 2023-07-16 (×3): 72 ug via ORAL
  Filled 2023-07-11 (×4): qty 1

## 2023-07-11 MED ORDER — ONDANSETRON HCL 4 MG/2ML IJ SOLN
4.0000 mg | Freq: Once | INTRAMUSCULAR | Status: AC
  Administered 2023-07-11: 4 mg via INTRAVENOUS
  Filled 2023-07-11: qty 2

## 2023-07-11 MED ORDER — ROCURONIUM BROMIDE 10 MG/ML (PF) SYRINGE
PREFILLED_SYRINGE | INTRAVENOUS | Status: DC | PRN
  Administered 2023-07-11: 70 mg via INTRAVENOUS

## 2023-07-11 MED ORDER — OXYCODONE HCL 5 MG/5ML PO SOLN: 5.0000 mg | Freq: Once | ORAL | Status: DC | PRN

## 2023-07-11 MED ORDER — ROSUVASTATIN CALCIUM 20 MG PO TABS
20.0000 mg | ORAL_TABLET | Freq: Every day | ORAL | Status: DC
  Administered 2023-07-12 – 2023-07-17 (×6): 20 mg via ORAL
  Filled 2023-07-11 (×6): qty 1

## 2023-07-11 MED ORDER — METHYLPREDNISOLONE 4 MG PO TBPK
8.0000 mg | ORAL_TABLET | Freq: Every morning | ORAL | Status: AC
  Filled 2023-07-11: qty 21

## 2023-07-11 MED ORDER — MIDAZOLAM HCL 2 MG/2ML IJ SOLN
INTRAMUSCULAR | Status: DC | PRN
  Administered 2023-07-11: 2 mg via INTRAVENOUS

## 2023-07-11 MED ORDER — THROMBIN 5000 UNITS EX SOLR
CUTANEOUS | Status: AC
  Filled 2023-07-11: qty 5000

## 2023-07-11 MED ORDER — METHOCARBAMOL 1000 MG/10ML IJ SOLN
500.0000 mg | Freq: Once | INTRAVENOUS | Status: AC
  Administered 2023-07-11: 500 mg via INTRAVENOUS
  Filled 2023-07-11: qty 5
  Filled 2023-07-11: qty 500

## 2023-07-11 MED ORDER — DEXAMETHASONE SODIUM PHOSPHATE 10 MG/ML IJ SOLN
INTRAMUSCULAR | Status: AC
  Filled 2023-07-11: qty 1

## 2023-07-11 MED ORDER — SUGAMMADEX SODIUM 200 MG/2ML IV SOLN
INTRAVENOUS | Status: DC | PRN
  Administered 2023-07-11: 20 mg via INTRAVENOUS

## 2023-07-11 MED ORDER — PROCHLORPERAZINE EDISYLATE 10 MG/2ML IJ SOLN
10.0000 mg | Freq: Four times a day (QID) | INTRAMUSCULAR | Status: DC | PRN
  Administered 2023-07-11: 10 mg via INTRAVENOUS
  Filled 2023-07-11: qty 2

## 2023-07-11 MED ORDER — LACTATED RINGERS IV SOLN
INTRAVENOUS | Status: DC | PRN
Start: 2023-07-11 — End: 2023-07-11

## 2023-07-11 MED ORDER — FENTANYL CITRATE (PF) 250 MCG/5ML IJ SOLN
INTRAMUSCULAR | Status: DC | PRN
  Administered 2023-07-11 (×2): 100 ug via INTRAVENOUS
  Administered 2023-07-11: 50 ug via INTRAVENOUS

## 2023-07-11 MED ORDER — ENOXAPARIN SODIUM 40 MG/0.4ML IJ SOSY
40.0000 mg | PREFILLED_SYRINGE | INTRAMUSCULAR | Status: DC
  Administered 2023-07-11 – 2023-07-16 (×6): 40 mg via SUBCUTANEOUS
  Filled 2023-07-11 (×6): qty 0.4

## 2023-07-11 MED ORDER — OXYCODONE-ACETAMINOPHEN 5-325 MG PO TABS
1.0000 | ORAL_TABLET | Freq: Four times a day (QID) | ORAL | Status: DC | PRN
  Administered 2023-07-11 – 2023-07-12 (×2): 2 via ORAL
  Filled 2023-07-11 (×3): qty 2

## 2023-07-11 MED ORDER — FENTANYL CITRATE (PF) 100 MCG/2ML IJ SOLN: 50.0000 ug | INTRAMUSCULAR | Status: DC | PRN

## 2023-07-11 MED ORDER — 0.9 % SODIUM CHLORIDE (POUR BTL) OPTIME
TOPICAL | Status: DC | PRN
  Administered 2023-07-11: 1000 mL

## 2023-07-11 MED ORDER — PHENYLEPHRINE HCL-NACL 20-0.9 MG/250ML-% IV SOLN
INTRAVENOUS | Status: AC
  Filled 2023-07-11: qty 250

## 2023-07-11 MED ORDER — CEFAZOLIN SODIUM-DEXTROSE 2-4 GM/100ML-% IV SOLN: 2.0000 g | INTRAVENOUS | Status: DC

## 2023-07-11 MED ORDER — THROMBIN 5000 UNITS EX SOLR
OROMUCOSAL | Status: DC | PRN
  Administered 2023-07-11: 5 mL via TOPICAL

## 2023-07-11 MED ORDER — ACETAMINOPHEN 325 MG PO TABS
650.0000 mg | ORAL_TABLET | Freq: Four times a day (QID) | ORAL | Status: DC | PRN
  Administered 2023-07-15: 650 mg via ORAL
  Filled 2023-07-11 (×2): qty 2

## 2023-07-11 MED ORDER — OXYCODONE HCL 5 MG PO TABS: 5.0000 mg | ORAL_TABLET | Freq: Once | ORAL | Status: DC | PRN

## 2023-07-11 MED ORDER — FENTANYL CITRATE (PF) 100 MCG/2ML IJ SOLN
INTRAMUSCULAR | Status: AC
  Administered 2023-07-11: 50 ug
  Filled 2023-07-11: qty 2

## 2023-07-11 MED ORDER — DEXAMETHASONE SODIUM PHOSPHATE 10 MG/ML IJ SOLN
INTRAMUSCULAR | Status: DC | PRN
  Administered 2023-07-11: 10 mg via INTRAVENOUS

## 2023-07-11 MED ORDER — ONDANSETRON HCL 4 MG/2ML IJ SOLN
INTRAMUSCULAR | Status: AC
  Filled 2023-07-11: qty 2

## 2023-07-11 MED ORDER — PROPOFOL 10 MG/ML IV BOLUS
INTRAVENOUS | Status: AC
  Filled 2023-07-11: qty 20

## 2023-07-11 MED ORDER — SODIUM CHLORIDE 0.9% FLUSH
3.0000 mL | Freq: Two times a day (BID) | INTRAVENOUS | Status: DC
  Administered 2023-07-11 – 2023-07-16 (×11): 3 mL via INTRAVENOUS

## 2023-07-11 MED ORDER — ASPIRIN 81 MG PO TBEC
81.0000 mg | DELAYED_RELEASE_TABLET | Freq: Every day | ORAL | Status: DC
  Administered 2023-07-12 – 2023-07-17 (×6): 81 mg via ORAL
  Filled 2023-07-11 (×6): qty 1

## 2023-07-11 MED ORDER — METHYLPREDNISOLONE 4 MG PO TBPK
4.0000 mg | ORAL_TABLET | Freq: Three times a day (TID) | ORAL | Status: AC
  Administered 2023-07-12 (×3): 4 mg via ORAL

## 2023-07-11 MED ORDER — LIDOCAINE 2% (20 MG/ML) 5 ML SYRINGE
INTRAMUSCULAR | Status: DC | PRN
  Administered 2023-07-11: 100 mg via INTRAVENOUS

## 2023-07-11 MED ORDER — METHYLPREDNISOLONE 4 MG PO TBPK
8.0000 mg | ORAL_TABLET | Freq: Every evening | ORAL | Status: DC
  Filled 2023-07-11: qty 21

## 2023-07-11 MED ORDER — CEFAZOLIN SODIUM-DEXTROSE 2-3 GM-%(50ML) IV SOLR
INTRAVENOUS | Status: DC | PRN
  Administered 2023-07-11: 2 g via INTRAVENOUS

## 2023-07-11 MED ORDER — CEFAZOLIN SODIUM 1 G IJ SOLR
INTRAMUSCULAR | Status: AC
  Filled 2023-07-11: qty 20

## 2023-07-11 MED ORDER — METHYLPREDNISOLONE 4 MG PO TBPK: 4.0000 mg | ORAL_TABLET | ORAL | Status: AC

## 2023-07-11 MED ORDER — ROCURONIUM BROMIDE 10 MG/ML (PF) SYRINGE
PREFILLED_SYRINGE | INTRAVENOUS | Status: AC
  Filled 2023-07-11: qty 10

## 2023-07-11 MED ORDER — KETAMINE HCL 50 MG/5ML IJ SOSY
PREFILLED_SYRINGE | INTRAMUSCULAR | Status: AC
  Filled 2023-07-11: qty 5

## 2023-07-11 MED ORDER — ACETAMINOPHEN 650 MG RE SUPP: 650.0000 mg | Freq: Four times a day (QID) | RECTAL | Status: DC | PRN

## 2023-07-11 MED ORDER — PROPOFOL 10 MG/ML IV BOLUS
INTRAVENOUS | Status: DC | PRN
  Administered 2023-07-11: 110 mg via INTRAVENOUS

## 2023-07-11 MED ORDER — PHENYLEPHRINE 80 MCG/ML (10ML) SYRINGE FOR IV PUSH (FOR BLOOD PRESSURE SUPPORT)
PREFILLED_SYRINGE | INTRAVENOUS | Status: AC
  Filled 2023-07-11: qty 10

## 2023-07-11 MED ORDER — INSULIN ASPART 100 UNIT/ML IJ SOLN
0.0000 [IU] | Freq: Every day | INTRAMUSCULAR | Status: DC
  Administered 2023-07-12: 2 [IU] via SUBCUTANEOUS
  Administered 2023-07-14: 3 [IU] via SUBCUTANEOUS
  Administered 2023-07-15: 5 [IU] via SUBCUTANEOUS

## 2023-07-11 MED ORDER — GADOBUTROL 1 MMOL/ML IV SOLN
8.0000 mL | Freq: Once | INTRAVENOUS | Status: AC | PRN
  Administered 2023-07-11: 8 mL via INTRAVENOUS

## 2023-07-11 MED ORDER — ACETAMINOPHEN 10 MG/ML IV SOLN
INTRAVENOUS | Status: AC
  Filled 2023-07-11: qty 100

## 2023-07-11 MED ORDER — MIDAZOLAM HCL 2 MG/2ML IJ SOLN
INTRAMUSCULAR | Status: AC
  Filled 2023-07-11: qty 2

## 2023-07-11 MED ORDER — LIDOCAINE 2% (20 MG/ML) 5 ML SYRINGE
INTRAMUSCULAR | Status: AC
  Filled 2023-07-11: qty 5

## 2023-07-11 MED ORDER — TIZANIDINE HCL 4 MG PO TABS
4.0000 mg | ORAL_TABLET | Freq: Three times a day (TID) | ORAL | Status: DC | PRN
  Administered 2023-07-11 – 2023-07-17 (×13): 4 mg via ORAL
  Filled 2023-07-11 (×13): qty 1

## 2023-07-11 MED ORDER — ONDANSETRON HCL 4 MG/2ML IJ SOLN: 4.0000 mg | Freq: Once | INTRAMUSCULAR | Status: DC | PRN

## 2023-07-11 MED ORDER — HYDROMORPHONE HCL 1 MG/ML IJ SOLN
1.0000 mg | INTRAMUSCULAR | Status: DC | PRN
  Administered 2023-07-11 – 2023-07-16 (×20): 1 mg via INTRAVENOUS
  Filled 2023-07-11 (×21): qty 1

## 2023-07-11 MED ORDER — ACETAMINOPHEN 10 MG/ML IV SOLN
1000.0000 mg | Freq: Once | INTRAVENOUS | Status: DC | PRN
  Administered 2023-07-11: 1000 mg via INTRAVENOUS

## 2023-07-11 MED ORDER — ACETAMINOPHEN 500 MG PO TABS
1000.0000 mg | ORAL_TABLET | Freq: Once | ORAL | Status: AC
  Administered 2023-07-11: 1000 mg via ORAL
  Filled 2023-07-11: qty 2

## 2023-07-11 MED ORDER — GABAPENTIN 300 MG PO CAPS
300.0000 mg | ORAL_CAPSULE | Freq: Three times a day (TID) | ORAL | Status: DC
  Administered 2023-07-11 – 2023-07-17 (×17): 300 mg via ORAL
  Filled 2023-07-11 (×17): qty 1

## 2023-07-11 MED ORDER — FENTANYL CITRATE (PF) 100 MCG/2ML IJ SOLN
25.0000 ug | INTRAMUSCULAR | Status: DC | PRN
  Administered 2023-07-11 (×4): 50 ug via INTRAVENOUS

## 2023-07-11 MED ORDER — ALBUTEROL SULFATE (2.5 MG/3ML) 0.083% IN NEBU: 2.5000 mg | INHALATION_SOLUTION | Freq: Four times a day (QID) | RESPIRATORY_TRACT | Status: DC | PRN

## 2023-07-11 MED ORDER — LIDOCAINE-EPINEPHRINE 1 %-1:100000 IJ SOLN
INTRAMUSCULAR | Status: DC | PRN
  Administered 2023-07-11: 10 mL

## 2023-07-11 MED ORDER — ONDANSETRON HCL 4 MG/2ML IJ SOLN
INTRAMUSCULAR | Status: DC | PRN
  Administered 2023-07-11: 4 mg via INTRAVENOUS

## 2023-07-11 MED ORDER — LIDOCAINE-EPINEPHRINE 1 %-1:100000 IJ SOLN
INTRAMUSCULAR | Status: AC
  Filled 2023-07-11: qty 1

## 2023-07-11 MED ORDER — MELATONIN 5 MG PO TABS
10.0000 mg | ORAL_TABLET | Freq: Every evening | ORAL | Status: DC | PRN
  Administered 2023-07-11 – 2023-07-13 (×2): 10 mg via ORAL
  Filled 2023-07-11 (×2): qty 2

## 2023-07-11 MED ORDER — LORAZEPAM 0.5 MG PO TABS
0.5000 mg | ORAL_TABLET | Freq: Every day | ORAL | Status: DC | PRN
  Administered 2023-07-13 – 2023-07-15 (×3): 0.5 mg via ORAL
  Filled 2023-07-11 (×3): qty 1

## 2023-07-11 MED ORDER — METHYLPREDNISOLONE 4 MG PO TBPK
8.0000 mg | ORAL_TABLET | Freq: Every evening | ORAL | Status: AC
  Administered 2023-07-11: 8 mg via ORAL

## 2023-07-11 MED ORDER — ONDANSETRON HCL 4 MG/2ML IJ SOLN
4.0000 mg | Freq: Four times a day (QID) | INTRAMUSCULAR | Status: DC | PRN
  Administered 2023-07-11 – 2023-07-12 (×2): 4 mg via INTRAVENOUS
  Filled 2023-07-11 (×2): qty 2

## 2023-07-11 MED ORDER — METHYLPREDNISOLONE 4 MG PO TBPK: 4.0000 mg | ORAL_TABLET | Freq: Four times a day (QID) | ORAL | Status: DC

## 2023-07-11 MED ORDER — KETAMINE HCL 10 MG/ML IJ SOLN
INTRAMUSCULAR | Status: DC | PRN
  Administered 2023-07-11: 20 mg via INTRAVENOUS

## 2023-07-11 SURGICAL SUPPLY — 52 items
ADH SKN CLS APL DERMABOND .7 (GAUZE/BANDAGES/DRESSINGS) ×1
BAG COUNTER SPONGE SURGICOUNT (BAG) ×1 IMPLANT
BAG SPNG CNTER NS LX DISP (BAG) ×1
BLADE CLIPPER SURG (BLADE) IMPLANT
BLADE SURG 11 STRL SS (BLADE) ×1 IMPLANT
BUR MATCHSTICK NEURO 3.0 LAGG (BURR) IMPLANT
BUR PRECISION FLUTE 5.0 (BURR) IMPLANT
CANISTER SUCT 3000ML PPV (MISCELLANEOUS) ×1 IMPLANT
DERMABOND ADVANCED .7 DNX12 (GAUZE/BANDAGES/DRESSINGS) ×1 IMPLANT
DRAPE C-ARM 42X72 X-RAY (DRAPES) ×2 IMPLANT
DRAPE LAPAROTOMY 100X72X124 (DRAPES) ×1 IMPLANT
DRAPE MICROSCOPE SLANT 54X150 (MISCELLANEOUS) ×1 IMPLANT
DRAPE SURG 17X23 STRL (DRAPES) ×1 IMPLANT
DURAPREP 26ML APPLICATOR (WOUND CARE) ×1 IMPLANT
ELECT REM PT RETURN 9FT ADLT (ELECTROSURGICAL) ×1
ELECTRODE REM PT RTRN 9FT ADLT (ELECTROSURGICAL) ×1 IMPLANT
GAUZE 4X4 16PLY ~~LOC~~+RFID DBL (SPONGE) IMPLANT
GAUZE SPONGE 4X4 12PLY STRL (GAUZE/BANDAGES/DRESSINGS) IMPLANT
GLOVE BIO SURGEON STRL SZ7.5 (GLOVE) ×1 IMPLANT
GLOVE BIOGEL PI IND STRL 7.0 (GLOVE) ×1 IMPLANT
GLOVE BIOGEL PI IND STRL 7.5 (GLOVE) ×1 IMPLANT
GLOVE ECLIPSE 7.5 STRL STRAW (GLOVE) ×1 IMPLANT
GLOVE EXAM NITRILE LRG STRL (GLOVE) IMPLANT
GLOVE EXAM NITRILE XL STR (GLOVE) IMPLANT
GLOVE EXAM NITRILE XS STR PU (GLOVE) IMPLANT
GOWN STRL REUS W/ TWL LRG LVL3 (GOWN DISPOSABLE) ×2 IMPLANT
GOWN STRL REUS W/ TWL XL LVL3 (GOWN DISPOSABLE) IMPLANT
GOWN STRL REUS W/TWL 2XL LVL3 (GOWN DISPOSABLE) IMPLANT
GOWN STRL REUS W/TWL LRG LVL3 (GOWN DISPOSABLE) ×3
GOWN STRL REUS W/TWL XL LVL3 (GOWN DISPOSABLE)
HEMOSTAT POWDER KIT SURGIFOAM (HEMOSTASIS) ×1 IMPLANT
KIT BASIN OR (CUSTOM PROCEDURE TRAY) ×1 IMPLANT
KIT TURNOVER KIT B (KITS) ×1 IMPLANT
NDL HYPO 18GX1.5 BLUNT FILL (NEEDLE) IMPLANT
NDL HYPO 22X1.5 SAFETY MO (MISCELLANEOUS) ×1 IMPLANT
NDL SPNL 18GX3.5 QUINCKE PK (NEEDLE) ×1 IMPLANT
NEEDLE HYPO 18GX1.5 BLUNT FILL (NEEDLE) IMPLANT
NEEDLE HYPO 22X1.5 SAFETY MO (MISCELLANEOUS) ×1 IMPLANT
NEEDLE SPNL 18GX3.5 QUINCKE PK (NEEDLE) ×1 IMPLANT
NS IRRIG 1000ML POUR BTL (IV SOLUTION) ×1 IMPLANT
PACK LAMINECTOMY NEURO (CUSTOM PROCEDURE TRAY) ×1 IMPLANT
PAD ARMBOARD 7.5X6 YLW CONV (MISCELLANEOUS) ×3 IMPLANT
SPIKE FLUID TRANSFER (MISCELLANEOUS) ×1 IMPLANT
SPONGE T-LAP 4X18 ~~LOC~~+RFID (SPONGE) IMPLANT
SUT MNCRL AB 3-0 PS2 18 (SUTURE) ×1 IMPLANT
SUT VIC AB 0 CT1 18XCR BRD8 (SUTURE) ×1 IMPLANT
SUT VIC AB 0 CT1 8-18 (SUTURE) ×1
SUT VIC AB 2-0 CP2 18 (SUTURE) ×1 IMPLANT
SYR 3ML LL SCALE MARK (SYRINGE) IMPLANT
TOWEL GREEN STERILE (TOWEL DISPOSABLE) ×1 IMPLANT
TOWEL GREEN STERILE FF (TOWEL DISPOSABLE) ×1 IMPLANT
WATER STERILE IRR 1000ML POUR (IV SOLUTION) ×1 IMPLANT

## 2023-07-11 NOTE — Op Note (Signed)
PATIENT: Raymond Melton  DAY OF SURGERY: 07/11/23   PRE-OPERATIVE DIAGNOSIS:  Lumbar radiculopathy   POST-OPERATIVE DIAGNOSIS:  Same   PROCEDURE:  Repeat left L4-5 open microdiscectomy   SURGEON:  Surgeon(s) and Role:    Jadene Pierini, MD   ANESTHESIA: ETGA   BRIEF HISTORY: This is a 67 year old man whom recently had an L4-5/L5-S1 open microdisc. He presented with recurrent pain and worsening foot drop on the left. An MRI was obtained which showed improved decompression compared to preop but still some residual disc material. I discussed this with the patient at length given the MRI findings. Given the worsened L5 myotome weakness with typical symptoms, we discussed the situation at length and he wished to proceed with an exploration and repeat decompression.    OPERATIVE DETAIL: The patient was taken to the operating room, anesthesia was induced by the anesthesia team, and the patient was placed on the OR table in the prone position. A formal time out was performed with two patient identifiers and confirmed the operative site. The operative site was marked, hair was clipped with surgical clippers, the area was then prepped and draped in a sterile fashion.   The existing incision was reopened, soft tissues resected, retractors placed. There were normal amounts of hemostatic agents without a visible hematoma, no signs of infection. The traversing root was immediately visible and was fairly large for an L5 root but did not appear conjoined. Interestingly, it was positioned further laterally than usual for a traversing root with a preferential dissection plane medial to the axilla. I therefore expanded the facetectomy laterally to perform a typical trajectory. I was able to safely get lateral to the root and remove disc below it, as it mobilized more I was able to get some of the medial disc material that was pushing it laterally with it returning to its usual position. I found no other  abnormalities and decompressed it down to the inferior foramen, most of which had obviously already been done. There was a palpable good decompression in all planes including through the axilla. I therefore obtained hemostasis, placed some floseal into the cavity, and copiously irrigated the wound.   All instrument and sponge counts were correct, and the incision was then closed in layers. The patient was then returned to anesthesia for emergence. No apparent complications at the completion of the procedure.   EBL:  50mL   DRAINS: none   SPECIMENS: none   Jadene Pierini, MD

## 2023-07-11 NOTE — ED Triage Notes (Signed)
Patient brought via EMS, pt HX of chronic back pain, s/p back surgery on 8/7, pt states the intermittent pain became constant and unbearable. Pain is worse when lying flat.

## 2023-07-11 NOTE — ED Notes (Signed)
Pt states he needed to use the bathroom. Pt asked to let the rail down so he could walk to the bathroom. This RN asked patient if he could walk okay. The rail was lowered for the patient. A few minutes later Vernona Rieger, RN found patient on the ground in his room. Pt states he was trying to put his pants on and his legs got weak. Pt assisted to the bed. Pt states I need to use the bathroom. Explained to patient it was not safe to walk to the bathroom and we could use a urinal/bedpan/bedside commode. Pt upset. Pt attempted to stand but almost fell again.

## 2023-07-11 NOTE — Progress Notes (Signed)
Pt to short stay  

## 2023-07-11 NOTE — ED Notes (Signed)
Pt transported to MRI 

## 2023-07-11 NOTE — ED Notes (Signed)
ED TO INPATIENT HANDOFF REPORT  ED Nurse Name and Phone #: Cat   S Name/Age/Gender Raymond Melton 67 y.o. male Room/Bed: 024C/024C  Code Status   Code Status: Full Code  Home/SNF/Other Home Patient oriented to: self, place, time, and situation Is this baseline? Yes   Triage Complete: Triage complete  Chief Complaint Postoperative back pain [G89.18, M54.9]  Triage Note Patient brought via EMS, pt HX of chronic back pain, s/p back surgery on 8/7, pt states the intermittent pain became constant and unbearable. Pain is worse when lying flat.   Allergies Allergies  Allergen Reactions   Iodinated Contrast Media Anaphylaxis and Swelling   Iodine Anaphylaxis   Ioxaglate Anaphylaxis and Swelling   Metrizamide Anaphylaxis and Swelling   Other Anaphylaxis, Shortness Of Breath and Swelling    Iv dye   Codeine Itching and Other (See Comments)    Other reaction: restless   Iohexol Other (See Comments)   Latex Other (See Comments)    Unknown reaction   Lisinopril Other (See Comments)    Unknown reaction   Metformin Hcl Other (See Comments) and Diarrhea   Metoclopramide Other (See Comments)   Mirtazapine Other (See Comments)   Tuberculin Tests Other (See Comments)    Severe skin reaction   Tuberculin, Ppd Other (See Comments)    Other reaction: Severe skin reaction   Valsartan Other (See Comments)    Unknown reaction   Zolpidem Other (See Comments)    confusion    Level of Care/Admitting Diagnosis ED Disposition     ED Disposition  Admit   Condition  --   Comment  Hospital Area: [facility] [[id number]]  Level of Care: Telemetry Medical [104]  May place patient in observation at [facility] or [facility] if equivalent level of care is available:: No  Covid Evaluation: Asymptomatic - no recent exposure (last 10 days) testing not required  Diagnosis: Postoperative back pain [[id number]]  Admitting Physician: Clydie Braun [[id number]]  Attending  Physician: Clydie Braun [[id number]]          B Medical/Surgery History Past Medical History:  Diagnosis Date   Alcohol abuse    Altered mental status 12/17/2017   Anxiety    Arthritis    Chronic pain    Complication of anesthesia    Depression    Diabetes mellitus without complication (HCC)    GERD (gastroesophageal reflux disease)    Hepatitis C    History of kidney stones    Hypertension    Immune deficiency disorder (HCC)    PONV (postoperative nausea and vomiting)    TB lung, latent    Past Surgical History:  Procedure Laterality Date   ABDOMINAL SURGERY     ANKLE SURGERY     CHOLECYSTECTOMY     CORONARY PRESSURE/FFR STUDY N/A 02/05/2018   Procedure: INTRAVASCULAR PRESSURE WIRE/FFR STUDY;  Surgeon: Lyn Records, MD;  Location: [facility] INVASIVE CV LAB;  Service: Cardiovascular;  Laterality: N/A;   CORONARY STENT INTERVENTION N/A 02/05/2018   Procedure: CORONARY STENT INTERVENTION;  Surgeon: Lyn Records, MD;  Location: [facility];  Service: Cardiovascular;  Laterality: N/A;   ELBOW SURGERY     EYE SURGERY Left    HERNIA REPAIR     LEFT HEART CATH AND CORONARY ANGIOGRAPHY N/A 02/05/2018   Procedure: LEFT HEART CATH AND CORONARY ANGIOGRAPHY;  Surgeon: Lyn Records, MD;  Location: [facility];  Service: Cardiovascular;  Laterality: N/A;   LUMBAR LAMINECTOMY/DECOMPRESSION  MICRODISCECTOMY Left 07/02/2023   Procedure: Microdiscectomy - left - Lumbar Four-Five/Lumbar Five-Sacral One;  Surgeon: Donalee Citrin, MD;  Location: [facility] OR;  Service: Neurosurgery;  Laterality: Left;  3C   NECK SURGERY     WRIST SURGERY       A IV Location/Drains/Wounds Patient Lines/Drains/Airways Status     Active Line/Drains/Airways     Name Placement date Placement time Site Days   Peripheral IV 07/11/23 20 G Anterior;Distal;Right;Upper Arm 07/11/23  0712  Arm  less than 1            Intake/Output Last 24 hours No intake or output data in the 24 hours ending 07/11/23  1356  Labs/Imaging Results for orders placed or performed during the hospital encounter of 07/11/23 (from the past 48 hour(s))  CBC with Differential     Status: Abnormal   Collection Time: 07/11/23  7:11 AM  Result Value Ref Range   WBC 7.4 4.0 - 10.5 K/uL   RBC 4.94 4.22 - 5.81 MIL/uL   Hemoglobin 15.3 13.0 - 17.0 g/dL   HCT 65.7 84.6 - 96.2 %   MCV 88.7 80.0 - 100.0 fL   MCH 31.0 26.0 - 34.0 pg   MCHC 34.9 30.0 - 36.0 g/dL   RDW 95.2 84.1 - 32.4 %   Platelets 244 150 - 400 K/uL   nRBC 0.0 0.0 - 0.2 %   Neutrophils Relative % 57 %   Neutro Abs 4.2 1.7 - 7.7 K/uL   Lymphocytes Relative 24 %   Lymphs Abs 1.8 0.7 - 4.0 K/uL   Monocytes Relative 9 %   Monocytes Absolute 0.6 0.1 - 1.0 K/uL   Eosinophils Relative 2 %   Eosinophils Absolute 0.2 0.0 - 0.5 K/uL   Basophils Relative 1 %   Basophils Absolute 0.1 0.0 - 0.1 K/uL   Immature Granulocytes 7 %   Abs Immature Granulocytes 0.52 (H) 0.00 - 0.07 K/uL    Comment: Performed at [facility], [address] [place], [place] [address]  Comprehensive metabolic panel     Status: Abnormal   Collection Time: 07/11/23  7:11 AM  Result Value Ref Range   Sodium 134 (L) 135 - 145 mmol/L   Potassium 4.0 3.5 - 5.1 mmol/L   Chloride 99 98 - 111 mmol/L   CO2 24 22 - 32 mmol/L   Glucose, Bld 163 (H) 70 - 99 mg/dL    Comment: Glucose reference range applies only to samples taken after fasting for at least 8 hours.   BUN 21 8 - 23 mg/dL   Creatinine, Ser 7.25 0.61 - 1.24 mg/dL   Calcium 9.4 8.9 - 36.6 mg/dL   Total Protein 7.0 6.5 - 8.1 g/dL   Albumin 3.8 3.5 - 5.0 g/dL   AST 40 15 - 41 U/L   ALT 121 (H) 0 - 44 U/L   Alkaline Phosphatase 114 38 - 126 U/L   Total Bilirubin 0.7 0.3 - 1.2 mg/dL   GFR, Estimated >44 >03 mL/min    Comment: (NOTE) Calculated using the CKD-EPI Creatinine Equation (2021)    Anion gap 11 5 - 15    Comment: Performed at [facility], [address] [place], [place] [address]   MR Lumbar Spine W  Wo Contrast  Result Date: 07/11/2023 CLINICAL DATA:  post-op, foot drop EXAM: MRI LUMBAR SPINE WITHOUT AND WITH CONTRAST TECHNIQUE: Multiplanar and multiecho pulse sequences of the lumbar spine were obtained without and with intravenous contrast. CONTRAST:  8mL GADAVIST GADOBUTROL 1 MMOL/ML IV SOLN COMPARISON:  MR L Spine 06/17/23. FINDINGS: Segmentation:  Standard. Alignment:  Physiologic. Vertebrae: No fracture, evidence of discitis, or bone lesion. L5-S1 laminectomy. Conus medullaris and cauda equina: Conus extends to the L1 level. Conus and cauda equina appear normal. Paraspinal and other soft tissues: Negative. Disc levels: T12-L1: Unremarkable L1-L2: Unremarkable L2-L3: Eccentric left disc bulge. Mild bilateral facet degenerative change. No spinal canal narrowing. Mild left neural foraminal narrowing. L3-L4: Eccentric left disc bulge. Mild bilateral facet degenerative change. No spinal canal narrowing. Mild left neural foraminal narrowing. L4-L5: Redemonstrated central disc extrusion. There is persistent narrowing of the bilateral lateral recesses, slightly improved from prior exam. Left laminectomy. Mild overall spinal canal narrowing. Moderate bilateral facet degenerative change. Mild right neural foraminal narrowing. L5-S1: Left laminectomy. Eccentric left disc bulge. Mild bilateral facet degenerative change. Mild-to-moderate left and mild right neural foraminal narrowing. There is contrast-enhancing soft tissue along the dorsal lateral and ventral lateral left aspect of the spinal canal at the L5-S1 level (see screen shot). This likely represents postsurgical change. IMPRESSION: 1. Postsurgical changes of left L4-L5 and L5-S1 laminectomies. Contrast-enhancing soft tissue along the dorsal lateral and ventral lateral left aspect of the spinal canal at the L5-S1 level likely represents evolving postsurgical change versus scar tissue. 2. Redemonstrated central disc extrusion at L4-L5 with persistent narrowing  of the bilateral lateral recesses, slightly improved from prior exam. 3. Mild-to-moderate left and mild right neural foraminal narrowing at L5-S1. Electronically Signed   By: Lorenza Cambridge M.D.   On: 07/11/2023 10:52    Pending Labs Unresulted Labs (From admission, onward)     Start     Ordered   07/12/23 0500  CBC  Tomorrow morning,   R        07/11/23 1252   07/12/23 0500  Basic metabolic panel  Tomorrow morning,   R        07/11/23 1252   07/11/23 1245  HIV Antibody (routine testing w rflx)  (HIV Antibody (Routine testing w reflex) panel)  Once,   R        07/11/23 1252            Vitals/Pain Today's Vitals   07/11/23 0900 07/11/23 0902 07/11/23 1032 07/11/23 1324  BP: 138/86  127/87 (!) 146/116  Pulse: 76  75 (!) 130  Resp: 19  17 18   Temp:   97.8 F (36.6 C) 97.8 F (36.6 C)  TempSrc:    Oral  SpO2: 96%  97% 97%  Weight:      Height:      PainSc:  6  6  8      Isolation Precautions No active isolations  Medications Medications  gabapentin (NEURONTIN) capsule 300 mg (has no administration in time range)  oxyCODONE-acetaminophen (PERCOCET/ROXICET) 5-325 MG per tablet 1 tablet (has no administration in time range)  enoxaparin (LOVENOX) injection 40 mg (40 mg Subcutaneous Given 07/11/23 1301)  sodium chloride flush (NS) 0.9 % injection 3 mL (3 mLs Intravenous Given 07/11/23 1303)  acetaminophen (TYLENOL) tablet 650 mg (has no administration in time range)    Or  acetaminophen (TYLENOL) suppository 650 mg (has no administration in time range)  ondansetron (ZOFRAN) tablet 4 mg (has no administration in time range)    Or  ondansetron (ZOFRAN) injection 4 mg (has no administration in time range)  albuterol (PROVENTIL) (2.5 MG/3ML) 0.083% nebulizer solution 2.5 mg (has no administration in time range)  HYDROmorphone (DILAUDID) injection 1 mg (has no  administration in time range)  HYDROmorphone (DILAUDID) injection 1 mg (1 mg Intravenous Given 07/11/23 0746)  methocarbamol  (ROBAXIN) tablet 500 mg (500 mg Oral Given 07/11/23 0744)  acetaminophen (TYLENOL) tablet 1,000 mg (1,000 mg Oral Given 07/11/23 0744)  ondansetron (ZOFRAN) injection 4 mg (4 mg Intravenous Given 07/11/23 0745)  HYDROmorphone (DILAUDID) injection 1 mg (1 mg Intravenous Given 07/11/23 0917)  gadobutrol (GADAVIST) 1 MMOL/ML injection 8 mL (8 mLs Intravenous Contrast Given 07/11/23 0957)    Mobility walks with person assist     Focused Assessments     R Recommendations: See Admitting Provider Note  Report given to:   Additional Notes:

## 2023-07-11 NOTE — Progress Notes (Signed)
Neurosurgery Service Post-operative progress note  Assessment & Plan: 67 y.o. man s/p repeat 4-5 open discectomy, seen in PACU, MAEx4.  -activity as tolerated except no lifting greater than 20 pounds -PT/OT -advance diet as tolerated -hold DVT chemoprophylaxis until 8/18 if still inpatient  Jadene Pierini  07/11/23 10:12 PM

## 2023-07-11 NOTE — H&P (Addendum)
History and Physical    Patient: Raymond Melton UVO:[id number] DOB: 1956-10-22 DOA: 07/11/2023 DOS: the patient was seen and examined on 07/11/2023 PCP: Galvin Proffer, MD  Patient coming from: Home  Chief Complaint:  Chief Complaint  Patient presents with   Back Pain   HPI: Raymond Melton is a 67 y.o. male with medical history significant of hypertension, hyperlipidemia, CAD s/p stent,, diabetes mellitus type 2, chronic back pain, radiculopathy, history of hepatitis C and remote history of alcohol abuse who presents with acute worsening of his back pain after laminectomy for hernia nucleus pulposus L4-5 L5-S1 left with left L5 and S1 radiculopathies with Dr. Wynetta Emery on 8/7   Prior to the surgery patient reported that he was able to use his left foot, but since the surgery his left foot drags the ground.  He reports having severe excruciating pain that radiates down the lower part of his back into his hip and down his left leg.  Pain seems to worsen whenever he lays flat.  Due to his symptoms he has been unable to walk without assistance.  He has been taking pain medications tramadol and hydrocodone given to him without improvement in symptoms.  Denies having any significant fever, chills, cough, breath, saddle anesthesia, or incontinence.  He was seen in the office yesterday and had been mended to start on gabapentin.  He reports that he has not drink alcohol in 5 years.  In the emergency department patient was noted to be afebrile with blood pressures elevated up to 168/107, and all other vital signs maintained.  Labs noted sodium 134, glucose 163, and ALT 121.  MRI of the lumbar spine noted postsurgical changes of the left L4-L5 and L5-S1 laminectomies with soft tissue enhancement along the dorsal lateral and ventral lateral left aspect of the spinal canal thought secondary to postoperative changes. Patient had been given Robaxin, Dilaudid IV, and antiemetics. Neurosurgery had been consulted  and recommended starting gabapentin.  While in the ED patient had noted to have fallen out of bed, but did not hit his head or consciousness.  Review of Systems: As mentioned in the history of present illness. All other systems reviewed and are negative. Past Medical History:  Diagnosis Date   Alcohol abuse    Altered mental status 12/17/2017   Anxiety    Arthritis    Chronic pain    Complication of anesthesia    Depression    Diabetes mellitus without complication (HCC)    GERD (gastroesophageal reflux disease)    Hepatitis C    History of kidney stones    Hypertension    Immune deficiency disorder (HCC)    PONV (postoperative nausea and vomiting)    TB lung, latent    Past Surgical History:  Procedure Laterality Date   ABDOMINAL SURGERY     ANKLE SURGERY     CHOLECYSTECTOMY     CORONARY PRESSURE/FFR STUDY N/A 02/05/2018   Procedure: INTRAVASCULAR PRESSURE WIRE/FFR STUDY;  Surgeon: Lyn Records, MD;  Location: [facility];  Service: Cardiovascular;  Laterality: N/A;   CORONARY STENT INTERVENTION N/A 02/05/2018   Procedure: CORONARY STENT INTERVENTION;  Surgeon: Lyn Records, MD;  Location: [facility];  Service: Cardiovascular;  Laterality: N/A;   ELBOW SURGERY     EYE SURGERY Left    HERNIA REPAIR     LEFT HEART CATH AND CORONARY ANGIOGRAPHY N/A 02/05/2018   Procedure: LEFT HEART CATH AND CORONARY ANGIOGRAPHY;  Surgeon: Verdis Prime  W, MD;  Location: [facility];  Service: Cardiovascular;  Laterality: N/A;   LUMBAR LAMINECTOMY/DECOMPRESSION MICRODISCECTOMY Left 07/02/2023   Procedure: Microdiscectomy - left - Lumbar Four-Five/Lumbar Five-Sacral One;  Surgeon: Donalee Citrin, MD;  Location: [facility] OR;  Service: Neurosurgery;  Laterality: Left;  3C   NECK SURGERY     WRIST SURGERY     Social History:  reports that he quit smoking about 21 months ago. His smoking use included cigarettes. He has never used smokeless tobacco. He reports that he does not currently  use alcohol. He reports that he does not use drugs.  Allergies  Allergen Reactions   Iodinated Contrast Media Anaphylaxis and Swelling   Iodine Anaphylaxis   Ioxaglate Anaphylaxis and Swelling   Metrizamide Anaphylaxis and Swelling   Other Anaphylaxis, Shortness Of Breath and Swelling    Iv dye   Codeine Itching and Other (See Comments)    Other reaction: restless   Iohexol Other (See Comments)   Latex Other (See Comments)    Unknown reaction   Lisinopril Other (See Comments)    Unknown reaction   Metformin Hcl Other (See Comments) and Diarrhea   Metoclopramide Other (See Comments)   Mirtazapine Other (See Comments)   Tuberculin Tests Other (See Comments)    Severe skin reaction   Tuberculin, Ppd Other (See Comments)    Other reaction: Severe skin reaction   Valsartan Other (See Comments)    Unknown reaction   Zolpidem Other (See Comments)    confusion    Family History  Problem Relation Age of Onset   Depression Mother    Hypertension Mother    Leukemia Mother    Colon polyps Mother    Stroke Father    Hypertension Father    Heart attack Father    Cancer Sister    Hypertension Sister    Cancer Brother        bladder   Hypertension Brother    Heart attack Brother     Prior to Admission medications   Medication Sig Start Date End Date Taking? Authorizing Provider  ALPRAZolam Prudy Feeler) 1 MG tablet Take 1 mg by mouth 3 (three) times daily as needed for anxiety or sleep.    [provider]  AMBULATORY NON FORMULARY MEDICATION Medication Name:90 ml 2% Lidocaine/90 ml Dicyclomine 10 mg/69ml/270 ml Maalox - Take 5-10 ml every 4-6 hours as needed. Patient not taking: Reported on 06/27/2023 02/15/22   Unk Lightning, PA  amLODipine (NORVASC) 10 MG tablet Take 10 mg by mouth daily. 11/27/18   [provider]  aspirin EC 81 MG tablet Take 81 mg by mouth daily. Swallow whole.    [provider]  calcium carbonate (TUMS EX) 750 MG chewable tablet  Chew 2 tablets by mouth daily as needed for heartburn.    [provider]  cyclobenzaprine (FLEXERIL) 10 MG tablet Take 1 tablet (10 mg total) by mouth 3 (three) times daily as needed for muscle spasms. 07/03/23   Meyran, Tiana Loft, NP  Dulaglutide 3 MG/0.5ML SOPN Inject 3 mg as directed once a week. trulicity    [provider]  ibuprofen (ADVIL) 400 MG tablet Take 400 mg by mouth every 6 (six) hours as needed for mild pain or moderate pain.    [provider]  linaclotide (LINZESS) 72 MCG capsule Take 72 mcg by mouth daily before breakfast.    [provider]  Melatonin 10 MG TABS Take 10 mg by mouth at bedtime as needed (  Sleep).    [provider]  methylPREDNISolone (MEDROL DOSEPAK) 4 MG TBPK tablet Take as directed 07/03/23   Meyran, Tiana Loft, NP  metoprolol succinate (TOPROL-XL) 100 MG 24 hr tablet Take 1 tablet (100 mg total) by mouth daily. Take with or immediately following a meal: For high blood pressure 12/26/17   Nwoko, Nicole Kindred I, NP  nitroGLYCERIN (NITROSTAT) 0.4 MG SL tablet Place 1 tablet (0.4 mg total) under the tongue every 5 (five) minutes x 3 doses as needed for chest pain. 03/17/23   Runell Gess, MD  rosuvastatin (CRESTOR) 20 MG tablet Take 1 tablet (20 mg total) by mouth daily. 02/06/18   Lanelle Bal, MD  tiZANidine (ZANAFLEX) 4 MG tablet Take 4 mg by mouth 3 (three) times daily as needed for muscle spasms. 11/06/21   [provider]    Physical Exam: Vitals:   07/11/23 0700 07/11/23 0800 07/11/23 0900 07/11/23 1032  BP: (!) 168/107 (!) 142/85 138/86 127/87  Pulse: 78 90 76 75  Resp: 18 17 19 17   Temp:    97.8 F (36.6 C)  TempSrc:      SpO2: 96% 99% 96% 97%  Weight:      Height:        Constitutional: Elderly male who appears to be in significant distress Eyes: PERRL, lids and conjunctivae normal ENMT: Mucous membranes are moist. Normal dentition.  Neck: normal, supple, no masses, no  thyromegaly Respiratory: clear to auscultation bilaterally, no wheezing, no crackles. Normal respiratory effort. No accessory muscle use.  Cardiovascular: Regular rate and rhythm, no murmurs / rubs / gallops. No extremity edema. 2+ pedal pulses. No carotid bruits.  Abdomen: no tenderness, no masses palpated. No hepatosplenomegaly. Bowel sounds positive.  Musculoskeletal: no clubbing / cyanosis.  Skin: Postoperative of the lumbar spine with surrounding bruising but no erythema or drainage appreciated Neurologic: CN 2-12 grossly intact.  Left foot drop.  Otherwise able to move all Psychiatric: Normal judgment and insight. Alert and oriented x 3. Normal mood.   Data Reviewed:  reviewed labs, imaging, and pertinent records as documented in this note. Assessment and Plan:  Postoperative back pain Left foot drop Acute.  Patient presents with reports of unbearable back pain status post laminectomy by Dr. Wynetta Emery on 8/7.  MRI of the lumbar spine noted postsurgical changes of the left L4-L5 and L5-S1 laminectomies with soft tissue enhancement along the dorsal lateral and ventral lateral left aspect of the spinal canal thought secondary to postoperative changeschanges.  He was noted to have a fall while in the ED without any acute trauma reported. -Admit to a medical telemetry bed -N.p.o. after midnight for exploratory surgery in a.m. -Continue gabapentin and Zanaflex as needed -Oxycodone/Dilaudid IV as needed for pain respectively. -Physical therapy to evaluate and treat -Appreciate neurosurgery consultative services we will follow-up for any further recommendations  Essential hypertension -Continue amlodipine and metoprolol  Controlled diabetes mellitus type 2, without long-term use of insulin Home medication regimen includes Trulicity in the outpatient setting. -Hypoglycemia protocols -CBGs before every meal with sensitive SSI while on steroids -Adjust regimen as needed  CAD Patient with  prior stent placed in 01/2018.  Patient reports Plavix has been on hold for procedure.  Denies any complaints of chest pain. -Continue aspirin and statin -Resume Plavix when medically appropriate  Hyperlipidemia  -Continue Crestor  Bipolar disorder PTSD Patient reports being on Xanax/Ativan for anxiety.  PMDP does not show any recent prescriptions. -Low-dose Ativan added on as needed for anxiety/tremors  Elevated ALT Acute.  ALT previously 68 on 8/7 with repeat check today 121.  Patient has a history of hepatitis C -Continue to monitor  History of alcohol abuse Patient reports being sober for the last 5 years. -Encouraged continued cessation of alcohol use.  DVT prophylaxis: Lovenox Advance Care Planning:   Code Status: Full Code   Consults: Neurosurgery  Family Communication: none  Severity of Illness: The appropriate patient status for this patient is INPATIENT. Inpatient status is judged to be reasonable and necessary in order to provide the required intensity of service to ensure the patient's safety. The patient's presenting symptoms, physical exam findings, and initial radiographic and laboratory data in the context of their chronic comorbidities is felt to place them at high risk for further clinical deterioration. Furthermore, it is not anticipated that the patient will be medically stable for discharge from the hospital within 2 midnights of admission.   * I certify that at the point of admission it is my clinical judgment that the patient will require inpatient hospital care spanning beyond 2 midnights from the point of admission due to high intensity of service, high risk for further deterioration and high frequency of surveillance required.* Author: Clydie Braun, MD 07/11/2023 11:55 AM  For on call review [email or web address].

## 2023-07-11 NOTE — Consult Note (Signed)
Neurosurgery Consultation  Reason for Consult: LLE pain and weakness Referring Physician: Katrinka Blazing  CC: LLE pain / weakness  HPI: This is a 67 y.o. man with history of LLE radic s/p open 4-5/5-1 discectomies. He now presents via EMS due to worsening pain and weakness in the LLE. No significant back pain, it travels down his LLE into the great toe, he has a chronic foot drop that he has noticed is worse as well. No change in bowel / bladder function, no right sided symptoms. He was doing well post-op until a few days ago when his preop symptoms recurred but with greater severity. Previously completed a medrol dose pak without improvement.    ROS: A 14 point ROS was performed and is negative except as noted in the HPI.   PMHx:  Past Medical History:  Diagnosis Date   Alcohol abuse    Altered mental status 12/17/2017   Anxiety    Arthritis    Chronic pain    Complication of anesthesia    Depression    Diabetes mellitus without complication (HCC)    GERD (gastroesophageal reflux disease)    Hepatitis C    History of kidney stones    Hypertension    Immune deficiency disorder (HCC)    PONV (postoperative nausea and vomiting)    TB lung, latent    FamHx:  Family History  Problem Relation Age of Onset   Depression Mother    Hypertension Mother    Leukemia Mother    Colon polyps Mother    Stroke Father    Hypertension Father    Heart attack Father    Cancer Sister    Hypertension Sister    Cancer Brother        bladder   Hypertension Brother    Heart attack Brother    SocHx:  reports that he quit smoking about 21 months ago. His smoking use included cigarettes. He has never used smokeless tobacco. He reports that he does not currently use alcohol. He reports that he does not use drugs.  Exam: Vital signs in last 24 hours: Temp:  [97.6 F (36.4 C)-97.8 F (36.6 C)] 97.8 F (36.6 C) (08/16 1324) Pulse Rate:  [75-130] 107 (08/16 1400) Resp:  [17-19] 18 (08/16 1324) BP:  (127-168)/(85-116) 147/109 (08/16 1400) SpO2:  [96 %-99 %] 97 % (08/16 1400) Weight:  [81.6 kg] 81.6 kg (08/16 0646) General: Awake, alert, cooperative, lying in bed, appears very uncomfortable Head: Normocephalic and atruamatic HEENT: Neck supple Pulmonary: breathing room air comfortably, no evidence of increased work of breathing Cardiac: RRR Abdomen: S NT ND Extremities: Warm and well perfused x4 Neuro: Strength 5/5 x4 except left EHL/TA 1/5, SILTx4 except L L5 distribution numbness   Assessment and Plan: 67 y.o. man s/p 4-5/5-1 discectomies a week ago, now with worsening pain and left foot weakness. MRI personally reviewed, which shows lamis at the correct levels with improved L 4-5 lateral recess stenosis but still persistent disc material medially, nerve appears better than preop but still some contact from the disc. At 5-1, normal post-op discectomy findings, there is diffuse disc bulge but no clear extraforaminal disc or abnormality at 5-1. .   -discussed with the patient, reviewed the imaging results with him. The MRI is improved from preop, I'm surprised he has worsening symptoms. This is obviously not at all consistent with a common peroneal masquerading as an L5 radic. He's miserable, no improvement with medications, with the worsening foot drop I offered an exploration  and repeat discectomy at 4-5 given his L5 myotome / dermatome symptoms and he would like to proceed. -NPO for OR tonight  Jadene Pierini, MD 07/11/23 2:32 PM [facility]

## 2023-07-11 NOTE — Transfer of Care (Addendum)
Immediate Anesthesia Transfer of Care Note  Patient: Raymond Melton  Procedure(s) Performed: REDO LUMBAR LAMINECTOMY/DECOMPRESSION MICRODISCECTOMY LUMBAR FOUR-FIVE  Patient Location: PACU  Anesthesia Type:General  Level of Consciousness: awake, alert , and oriented  Airway & Oxygen Therapy: Patient Spontanous Breathing and Patient connected to face mask oxygen  Post-op Assessment: Report given to RN and Post -op Vital signs reviewed and stable  Post vital signs: Reviewed and stable  Last Vitals:  Vitals Value Taken Time  BP 155/103 07/11/23 2220  Temp    Pulse 71 07/11/23 2223  Resp 18 07/11/23 2223  SpO2 98 % 07/11/23 2223  Vitals shown include unfiled device data.  Last Pain:  Vitals:   07/11/23 1914  TempSrc: Oral  PainSc:          Complications: No notable events documented.

## 2023-07-11 NOTE — ED Provider Notes (Signed)
[facility] EMERGENCY DEPARTMENT AT [facility] Provider Note   CSN: [id number] Arrival date & time: 07/11/23  1884     History  Chief Complaint  Patient presents with   Back Pain    Raymond Melton is a 67 y.o. male with PMH as listed below who presents via EMS, pt HX of chronic back pain, s/p left laminectomy L4-L5, L5-S1 on 8/7.patient states he woke up from the surgery with left foot drop that he did not have before and pain radiating from his lower back down his left buttock down his leg.  He has had severe postop pain managed incompletely at home and was seen again by his neurosurgeon Dr. Wynetta Emery yesterday who is aware of the left foot drop and recommended more pain management and to come to the ED if he got worse.  Patient states that now the pain is unbearable, he is is very uncomfortable appearing in the room.  Pain is worse when lying flat.  He denies any fever/chills, saddle anesthesia, bilateral lower extremity weakness/radicular pain, new trauma.  He states he is almost fallen several times at home due to pain in the left foot drop.   Past Medical History:  Diagnosis Date   Alcohol abuse    Altered mental status 12/17/2017   Anxiety    Arthritis    Chronic pain    Complication of anesthesia    Depression    Diabetes mellitus without complication (HCC)    GERD (gastroesophageal reflux disease)    Hepatitis C    History of kidney stones    Hypertension    Immune deficiency disorder (HCC)    PONV (postoperative nausea and vomiting)    TB lung, latent        Home Medications Prior to Admission medications   Medication Sig Start Date End Date Taking? Authorizing Provider  ALPRAZolam Prudy Feeler) 1 MG tablet Take 1 mg by mouth 3 (three) times daily as needed for anxiety or sleep.    [provider]  AMBULATORY NON FORMULARY MEDICATION Medication Name:90 ml 2% Lidocaine/90 ml Dicyclomine 10 mg/51ml/270 ml Maalox - Take 5-10 ml every 4-6 hours as  needed. Patient not taking: Reported on 06/27/2023 02/15/22   Unk Lightning, PA  amLODipine (NORVASC) 10 MG tablet Take 10 mg by mouth daily. 11/27/18   [provider]  aspirin EC 81 MG tablet Take 81 mg by mouth daily. Swallow whole.    [provider]  calcium carbonate (TUMS EX) 750 MG chewable tablet Chew 2 tablets by mouth daily as needed for heartburn.    [provider]  cyclobenzaprine (FLEXERIL) 10 MG tablet Take 1 tablet (10 mg total) by mouth 3 (three) times daily as needed for muscle spasms. 07/03/23   Meyran, Tiana Loft, NP  Dulaglutide 3 MG/0.5ML SOPN Inject 3 mg as directed once a week. trulicity    [provider]  ibuprofen (ADVIL) 400 MG tablet Take 400 mg by mouth every 6 (six) hours as needed for mild pain or moderate pain.    [provider]  linaclotide (LINZESS) 72 MCG capsule Take 72 mcg by mouth daily before breakfast.    [provider]  Melatonin 10 MG TABS Take 10 mg by mouth at bedtime as needed (Sleep).    [provider]  methylPREDNISolone (MEDROL DOSEPAK) 4 MG TBPK tablet Take as directed 07/03/23   Meyran, Tiana Loft, NP  metoprolol succinate (TOPROL-XL) 100 MG 24 hr tablet Take 1 tablet (100  mg total) by mouth daily. Take with or immediately following a meal: For high blood pressure 12/26/17   Nwoko, Nicole Kindred I, NP  nitroGLYCERIN (NITROSTAT) 0.4 MG SL tablet Place 1 tablet (0.4 mg total) under the tongue every 5 (five) minutes x 3 doses as needed for chest pain. 03/17/23   Runell Gess, MD  rosuvastatin (CRESTOR) 20 MG tablet Take 1 tablet (20 mg total) by mouth daily. 02/06/18   Lanelle Bal, MD  tiZANidine (ZANAFLEX) 4 MG tablet Take 4 mg by mouth 3 (three) times daily as needed for muscle spasms. 11/06/21   [provider]      Allergies    Iodinated contrast media; Iodine; Ioxaglate; Metrizamide; Other; Codeine; Iohexol; Latex; Lisinopril; Metformin hcl; Metoclopramide;  Mirtazapine; Tuberculin tests; Tuberculin, ppd; Valsartan; and Zolpidem    Review of Systems   Review of Systems A 10 point review of systems was performed and is negative unless otherwise reported in HPI.  Physical Exam Updated Vital Signs BP 127/87   Pulse 75   Temp 97.8 F (36.6 C)   Resp 17   Ht 6\' 1"  (1.854 m)   Wt 81.6 kg   SpO2 97%   BMI 23.75 kg/m  Physical Exam General: Normal appearing male, lying in bed.  HEENT: Sclera anicteric, MMM, trachea midline.  Cardiology: RRR, no murmurs/rubs/gallops. DP pulses equal bilaterally.  Resp: Normal respiratory rate and effort. CTAB, no wheezes, rhonchi, crackles.  Abd: Soft, non-tender, non-distended. No rebound tenderness or guarding.  GU: Deferred. MSK: No peripheral edema or signs of trauma.  Skin: warm, dry.  Back: 6 centimeter vertical incision in midline lumbar spine with Steri-Strips still in place.  No surrounding erythema or induration however there is some fullness to the area with surrounding ecchymosis.  No purulent drainage Neuro: A&Ox4, CNs II-XII grossly intact. 5/5 strength BL UEs, RLE.  2 out of 5 strength of left foot dorsiflexion and knee extension.  Sensation grossly intact.  Psych: Normal mood and affect.   ED Results / Procedures / Treatments   Labs (all labs ordered are listed, but only abnormal results are displayed) Labs Reviewed  CBC WITH DIFFERENTIAL/PLATELET - Abnormal; Notable for the following components:      Result Value   Abs Immature Granulocytes 0.52 (*)    All other components within normal limits  COMPREHENSIVE METABOLIC PANEL - Abnormal; Notable for the following components:   Sodium 134 (*)    Glucose, Bld 163 (*)    ALT 121 (*)    All other components within normal limits    EKG None  Radiology MR Lumbar Spine W Wo Contrast  Result Date: 07/11/2023 CLINICAL DATA:  post-op, foot drop EXAM: MRI LUMBAR SPINE WITHOUT AND WITH CONTRAST TECHNIQUE: Multiplanar and multiecho pulse  sequences of the lumbar spine were obtained without and with intravenous contrast. CONTRAST:  8mL GADAVIST GADOBUTROL 1 MMOL/ML IV SOLN COMPARISON:  MR L Spine 06/17/23. FINDINGS: Segmentation:  Standard. Alignment:  Physiologic. Vertebrae: No fracture, evidence of discitis, or bone lesion. L5-S1 laminectomy. Conus medullaris and cauda equina: Conus extends to the L1 level. Conus and cauda equina appear normal. Paraspinal and other soft tissues: Negative. Disc levels: T12-L1: Unremarkable L1-L2: Unremarkable L2-L3: Eccentric left disc bulge. Mild bilateral facet degenerative change. No spinal canal narrowing. Mild left neural foraminal narrowing. L3-L4: Eccentric left disc bulge. Mild bilateral facet degenerative change. No spinal canal narrowing. Mild left neural foraminal narrowing. L4-L5: Redemonstrated central disc extrusion. There is persistent narrowing of the bilateral lateral recesses,  slightly improved from prior exam. Left laminectomy. Mild overall spinal canal narrowing. Moderate bilateral facet degenerative change. Mild right neural foraminal narrowing. L5-S1: Left laminectomy. Eccentric left disc bulge. Mild bilateral facet degenerative change. Mild-to-moderate left and mild right neural foraminal narrowing. There is contrast-enhancing soft tissue along the dorsal lateral and ventral lateral left aspect of the spinal canal at the L5-S1 level (see screen shot). This likely represents postsurgical change. IMPRESSION: 1. Postsurgical changes of left L4-L5 and L5-S1 laminectomies. Contrast-enhancing soft tissue along the dorsal lateral and ventral lateral left aspect of the spinal canal at the L5-S1 level likely represents evolving postsurgical change versus scar tissue. 2. Redemonstrated central disc extrusion at L4-L5 with persistent narrowing of the bilateral lateral recesses, slightly improved from prior exam. 3. Mild-to-moderate left and mild right neural foraminal narrowing at L5-S1. Electronically  Signed   By: Lorenza Cambridge M.D.   On: 07/11/2023 10:52    Procedures Procedures    Medications Ordered in ED Medications  gabapentin (NEURONTIN) capsule 300 mg (has no administration in time range)  HYDROmorphone (DILAUDID) injection 1 mg (1 mg Intravenous Given 07/11/23 0746)  methocarbamol (ROBAXIN) tablet 500 mg (500 mg Oral Given 07/11/23 0744)  acetaminophen (TYLENOL) tablet 1,000 mg (1,000 mg Oral Given 07/11/23 0744)  ondansetron (ZOFRAN) injection 4 mg (4 mg Intravenous Given 07/11/23 0745)  HYDROmorphone (DILAUDID) injection 1 mg (1 mg Intravenous Given 07/11/23 0917)  gadobutrol (GADAVIST) 1 MMOL/ML injection 8 mL (8 mLs Intravenous Contrast Given 07/11/23 0957)    ED Course/ Medical Decision Making/ A&P                          Medical Decision Making Amount and/or Complexity of Data Reviewed Labs: ordered. Decision-making details documented in ED Course. Radiology: ordered. Decision-making details documented in ED Course.  Risk OTC drugs. Prescription drug management. Decision regarding hospitalization.    This patient presents to the ED for concern of back pain w/ L foot drop, this involves an extensive number of treatment options, and is a complaint that carries with it a high risk of complications and morbidity.  I considered the following differential and admission for this acute, potentially life threatening condition.   MDM:    Consider epidural hematoma, epidural abscess, discitis, transverse myelitis though no fevers or chills and no leukocytosis.  Consider cauda equina syndrome though no urinary/bowel symptoms but patient does have this new foot drop which is objective on exam.  Patient will need MRI for further evaluation of his L-spine.  In the meantime we will give pain control with IV Dilaudid and Tylenol/robaxin.   Clinical Course as of 07/11/23 1209  Fri Jul 11, 2023  0850 WBC: 7.4 No leukocytosis  [HN]  0851 ALT(!): 121 Mildly elevated ALT which  patient has had in the past. No RUQ TTP.  [HN]  1056 MR Lumbar Spine W Wo Contrast 1. Postsurgical changes of left L4-L5 and L5-S1 laminectomies. Contrast-enhancing soft tissue along the dorsal lateral and ventral lateral left aspect of the spinal canal at the L5-S1 level likely represents evolving postsurgical change versus scar tissue. 2. Redemonstrated central disc extrusion at L4-L5 with persistent narrowing of the bilateral lateral recesses, slightly improved from prior exam. 3. Mild-to-moderate left and mild right neural foraminal narrowing at L5-S1.   [HN]  1126 After several rounds of dilaudid, patient still with pain 6/10. Patient w/ severe pain will not be able to manage at home. Imaging reassuring against neurosurgical emergency. Will  consult to NSGY as FYI and consult to hospitalist for admission for pain management. [HN]  1207 D/w Kennon Rounds w/ NSGY who also recommended gabapentin 300 mg TID. Admitted to hospitalist.  [HN]    Clinical Course User Index [HN] Loetta Rough, MD    Labs: I Ordered, and personally interpreted labs.  The pertinent results include:  those listed above  Imaging Studies ordered: I ordered imaging studies including MRI Lumbar spine w/wo contrast I independently visualized and interpreted imaging. I agree with the radiologist interpretation  Additional history obtained from chart review.  Reevaluation: After the interventions noted above, I reevaluated the patient and found that they have :improved  Social Determinants of Health: Lives independently with wife  Disposition:  Admit  Co morbidities that complicate the patient evaluation  Past Medical History:  Diagnosis Date   Alcohol abuse    Altered mental status 12/17/2017   Anxiety    Arthritis    Chronic pain    Complication of anesthesia    Depression    Diabetes mellitus without complication (HCC)    GERD (gastroesophageal reflux disease)    Hepatitis C    History of kidney stones     Hypertension    Immune deficiency disorder (HCC)    PONV (postoperative nausea and vomiting)    TB lung, latent      Medicines Meds ordered this encounter  Medications   HYDROmorphone (DILAUDID) injection 1 mg   methocarbamol (ROBAXIN) tablet 500 mg   acetaminophen (TYLENOL) tablet 1,000 mg   ondansetron (ZOFRAN) injection 4 mg   HYDROmorphone (DILAUDID) injection 1 mg   gadobutrol (GADAVIST) 1 MMOL/ML injection 8 mL   gabapentin (NEURONTIN) capsule 300 mg    I have reviewed the patients home medicines and have made adjustments as needed  Problem List / ED Course: Problem List Items Addressed This Visit   None Visit Diagnoses     Acute post-operative pain    -  Primary   Acquired left foot drop                       This note was created using dictation software, which may contain spelling or grammatical errors.    Loetta Rough, MD 07/11/23 [phone]

## 2023-07-11 NOTE — Anesthesia Preprocedure Evaluation (Addendum)
Anesthesia Evaluation  Patient identified by MRN, date of birth, ID band Patient awake    Reviewed: Allergy & Precautions, H&P , NPO status , Patient's Chart, lab work & pertinent test results  History of Anesthesia Complications (+) PONV and history of anesthetic complications  Airway Mallampati: III  TM Distance: >3 FB Neck ROM: Limited    Dental no notable dental hx.    Pulmonary neg COPD, former smoker   breath sounds clear to auscultation       Cardiovascular Exercise Tolerance: Poor hypertension, + CAD and + Cardiac Stents  (-) CABG and (-) Orthopnea  Rhythm:Regular Rate:Normal     Neuro/Psych  PSYCHIATRIC DISORDERS Anxiety Depression Bipolar Disorder    Neuromuscular disease    GI/Hepatic ,GERD  ,,(+) Hepatitis -, C  Endo/Other  diabetes    Renal/GU negative Renal ROS  negative genitourinary   Musculoskeletal negative musculoskeletal ROS (+)    Abdominal   Peds negative pediatric ROS (+)  Hematology negative hematology ROS (+)   Anesthesia Other Findings   Reproductive/Obstetrics negative OB ROS                              Anesthesia Physical Anesthesia Plan  ASA: 3  Anesthesia Plan: General   Post-op Pain Management:    Induction: Intravenous  PONV Risk Score and Plan: 2 and Ondansetron and Dexamethasone  Airway Management Planned: Oral ETT and Video Laryngoscope Planned  Additional Equipment:   Intra-op Plan:   Post-operative Plan:   Informed Consent:      Dental advisory given  Plan Discussed with: CRNA  Anesthesia Plan Comments:          Anesthesia Quick Evaluation

## 2023-07-11 NOTE — Plan of Care (Signed)
  Problem: Education: Goal: Ability to verbalize activity precautions or restrictions will improve 07/11/2023 1654 by Sheppard Evens, RN Outcome: Progressing 07/11/2023 1654 by Sheppard Evens, RN Outcome: Progressing Goal: Knowledge of the prescribed therapeutic regimen will improve 07/11/2023 1654 by Sheppard Evens, RN Outcome: Progressing 07/11/2023 1654 by Sheppard Evens, RN Outcome: Progressing Goal: Understanding of discharge needs will improve 07/11/2023 1654 by Sheppard Evens, RN Outcome: Progressing 07/11/2023 1654 by Sheppard Evens, RN Outcome: Progressing   Problem: Activity: Goal: Ability to avoid complications of mobility impairment will improve 07/11/2023 1654 by Sheppard Evens, RN Outcome: Progressing 07/11/2023 1654 by Sheppard Evens, RN Outcome: Progressing Goal: Ability to tolerate increased activity will improve Outcome: Progressing Goal: Will remain free from falls Outcome: Progressing   Problem: Bowel/Gastric: Goal: Gastrointestinal status for postoperative course will improve Outcome: Progressing   Problem: Clinical Measurements: Goal: Ability to maintain clinical measurements within normal limits will improve Outcome: Progressing Goal: Postoperative complications will be avoided or minimized Outcome: Progressing Goal: Diagnostic test results will improve Outcome: Progressing   Problem: Pain Management: Goal: Pain level will decrease Outcome: Progressing   Problem: Skin Integrity: Goal: Will show signs of wound healing Outcome: Progressing   Problem: Health Behavior/Discharge Planning: Goal: Identification of resources available to assist in meeting health care needs will improve Outcome: Progressing   Problem: Bladder/Genitourinary: Goal: Urinary functional status for postoperative course will improve Outcome: Progressing   Problem: Education: Goal: Knowledge of General Education information will improve Description: Including  pain rating scale, medication(s)/side effects and non-pharmacologic comfort measures Outcome: Progressing   Problem: Health Behavior/Discharge Planning: Goal: Ability to manage health-related needs will improve Outcome: Progressing   Problem: Clinical Measurements: Goal: Ability to maintain clinical measurements within normal limits will improve Outcome: Progressing Goal: Will remain free from infection Outcome: Progressing Goal: Diagnostic test results will improve Outcome: Progressing Goal: Respiratory complications will improve Outcome: Progressing Goal: Cardiovascular complication will be avoided Outcome: Progressing   Problem: Activity: Goal: Risk for activity intolerance will decrease Outcome: Progressing   Problem: Nutrition: Goal: Adequate nutrition will be maintained Outcome: Progressing   Problem: Coping: Goal: Level of anxiety will decrease Outcome: Progressing   Problem: Elimination: Goal: Will not experience complications related to bowel motility Outcome: Progressing Goal: Will not experience complications related to urinary retention Outcome: Progressing   Problem: Pain Managment: Goal: General experience of comfort will improve Outcome: Progressing   Problem: Safety: Goal: Ability to remain free from injury will improve Outcome: Progressing   Problem: Skin Integrity: Goal: Risk for impaired skin integrity will decrease Outcome: Progressing

## 2023-07-11 NOTE — Progress Notes (Signed)
Patient refuses bed alarm. Call light is in reach. Education done with the risks pf falling. Says he does not need it. Does have fall bracelet on and non skid yellow socks on. Patient using a front wheel walker. Was told by prior rn in the ED he fell while ambulating to the restroom.

## 2023-07-11 NOTE — ED Notes (Signed)
Explained to patient that it is unsafe for him to stand alone. Pt asking for a male personnel to assist her. Charge RN notified and a male personnel went to assist patient. Pt reports being upset that he could not stand in his room alone to use the bathroom. Pt almost fell multiple times while staff at bedside. Explained to patient again that he could not be left alone standing for his safety due to being a fall risk and his legs being weak. Dr. Madelyn Flavors made aware and advised to come see the patient due to patient stating he wanted to leave.

## 2023-07-11 NOTE — Anesthesia Procedure Notes (Addendum)
Procedure Name: Intubation Date/Time: 07/11/2023 9:49 PM  Performed by: Camillia Herter, CRNAPre-anesthesia Checklist: Patient identified, Emergency Drugs available, Suction available and Patient being monitored Patient Re-evaluated:Patient Re-evaluated prior to induction Oxygen Delivery Method: Circle System Utilized Preoxygenation: Pre-oxygenation with 100% oxygen Induction Type: IV induction Ventilation: Mask ventilation without difficulty Laryngoscope Size: Glidescope and 4 Grade View: Grade I Tube type: Oral Tube size: 8.0 mm Number of attempts: 1 Airway Equipment and Method: Stylet, Oral airway and Video-laryngoscopy Placement Confirmation: ETT inserted through vocal cords under direct vision, positive ETCO2 and breath sounds checked- equal and bilateral Tube secured with: Tape Dental Injury: Teeth and Oropharynx as per pre-operative assessment  Comments: Soft bite block placed.

## 2023-07-11 NOTE — Progress Notes (Signed)
PT Cancellation Note  Patient Details Name: Raymond Melton MRN: [id number] DOB: Apr 21, 1956   Cancelled Treatment:    Reason Eval/Treat Not Completed: Medical issues which prohibited therapy. Plan for OR tonight per chart review. PT will hold until after surgery.   Arlyss Gandy 07/11/2023, 3:26 PM

## 2023-07-12 ENCOUNTER — Encounter (HOSPITAL_COMMUNITY): Payer: Self-pay | Admitting: Neurological Surgery

## 2023-07-12 DIAGNOSIS — G8918 Other acute postprocedural pain: Secondary | ICD-10-CM | POA: Diagnosis not present

## 2023-07-12 DIAGNOSIS — M549 Dorsalgia, unspecified: Secondary | ICD-10-CM | POA: Diagnosis not present

## 2023-07-12 LAB — CBC
HCT: 38.5 % — ABNORMAL LOW (ref 39.0–52.0)
Hemoglobin: 13.6 g/dL (ref 13.0–17.0)
MCH: 31.6 pg (ref 26.0–34.0)
MCHC: 35.3 g/dL (ref 30.0–36.0)
MCV: 89.3 fL (ref 80.0–100.0)
Platelets: 201 10*3/uL (ref 150–400)
RBC: 4.31 MIL/uL (ref 4.22–5.81)
RDW: 12.3 % (ref 11.5–15.5)
WBC: 11.7 10*3/uL — ABNORMAL HIGH (ref 4.0–10.5)
nRBC: 0 % (ref 0.0–0.2)

## 2023-07-12 LAB — GLUCOSE, CAPILLARY
Glucose-Capillary: 274 mg/dL — ABNORMAL HIGH (ref 70–99)
Glucose-Capillary: 234 mg/dL — ABNORMAL HIGH (ref 70–99)
Glucose-Capillary: 254 mg/dL — ABNORMAL HIGH (ref 70–99)
Glucose-Capillary: 213 mg/dL — ABNORMAL HIGH (ref 70–99)

## 2023-07-12 LAB — BASIC METABOLIC PANEL
Anion gap: 12 (ref 5–15)
BUN: 21 mg/dL (ref 8–23)
CO2: 22 mmol/L (ref 22–32)
Calcium: 8.5 mg/dL — ABNORMAL LOW (ref 8.9–10.3)
Chloride: 96 mmol/L — ABNORMAL LOW (ref 98–111)
Creatinine, Ser: 0.75 mg/dL (ref 0.61–1.24)
GFR, Estimated: >60 mL/min (ref >60)
Glucose, Bld: 228 mg/dL — ABNORMAL HIGH (ref 70–99)
Potassium: 3.9 mmol/L (ref 3.5–5.1)
Sodium: 130 mmol/L — ABNORMAL LOW (ref 135–145)

## 2023-07-12 LAB — HEMOGLOBIN A1C
Hgb A1c MFr Bld: 7 % — ABNORMAL HIGH (ref 4.8–5.6)
Mean Plasma Glucose: 154.2 mg/dL

## 2023-07-12 MED ORDER — METHYLPREDNISOLONE 4 MG PO TABS
8.0000 mg | ORAL_TABLET | Freq: Every evening | ORAL | Status: AC
  Administered 2023-07-12: 8 mg via ORAL
  Filled 2023-07-12: qty 2

## 2023-07-12 MED ORDER — METHYLPREDNISOLONE 4 MG PO TABS: 4.0000 mg | ORAL_TABLET | Freq: Three times a day (TID) | ORAL | Status: DC

## 2023-07-12 MED ORDER — METHYLPREDNISOLONE 4 MG PO TABS: 4.0000 mg | ORAL_TABLET | Freq: Two times a day (BID) | ORAL | Status: DC

## 2023-07-12 MED ORDER — METHYLPREDNISOLONE 4 MG PO TABS: 4.0000 mg | ORAL_TABLET | Freq: Every day | ORAL | Status: DC

## 2023-07-12 MED ORDER — METHYLPREDNISOLONE 4 MG PO TABS
4.0000 mg | ORAL_TABLET | Freq: Four times a day (QID) | ORAL | Status: DC
  Filled 2023-07-12 (×2): qty 1

## 2023-07-12 NOTE — Plan of Care (Signed)
  Problem: Education: Goal: Ability to verbalize activity precautions or restrictions will improve Outcome: Progressing   Problem: Activity: Goal: Ability to avoid complications of mobility impairment will improve Outcome: Progressing   Problem: Clinical Measurements: Goal: Ability to maintain clinical measurements within normal limits will improve Outcome: Progressing   Problem: Pain Management: Goal: Pain level will decrease Outcome: Progressing   Problem: Skin Integrity: Goal: Will show signs of wound healing Outcome: Progressing   Problem: Bladder/Genitourinary: Goal: Urinary functional status for postoperative course will improve Outcome: Progressing   Problem: Health Behavior/Discharge Planning: Goal: Identification of resources available to assist in meeting health care needs will improve Outcome: Progressing

## 2023-07-12 NOTE — Progress Notes (Signed)
Subjective: The patient is alert and pleasant.  He is in no apparent distress.  His wife is at the bedside.  Objective: Vital signs in last 24 hours: Temp:  [97 F (36.1 C)-98.2 F (36.8 C)] 97.6 F (36.4 C) (08/17 0744) Pulse Rate:  [67-130] 97 (08/17 0744) Resp:  [10-18] 17 (08/17 0744) BP: (127-174)/(73-116) 136/73 (08/17 0744) SpO2:  [95 %-100 %] 95 % (08/17 0744) Estimated body mass index is 23.75 kg/m as calculated from the following:   Height as of this encounter: 6\' 1"  (1.854 m).   Weight as of this encounter: 81.6 kg.   Intake/Output from previous day: 08/16 0701 - 08/17 0700 In: 1000 [I.V.:1000] Out: 550 [Urine:500; Blood:50] Intake/Output this shift: No intake/output data recorded.  Physical exam the patient is alert and pleasant.  He has a left foot drop and chronic left facial droop.  Lab Results: Recent Labs    07/11/23 0711 07/12/23 0114  WBC 7.4 11.7*  HGB 15.3 13.6  HCT 43.8 38.5*  PLT 244 201   BMET Recent Labs    07/11/23 0711 07/12/23 0114  NA 134* 130*  K 4.0 3.9  CL 99 96*  CO2 24 22  GLUCOSE 163* 228*  BUN 21 21  CREATININE 0.80 0.75  CALCIUM 9.4 8.5*    Studies/Results: MR Lumbar Spine W Wo Contrast  Result Date: 07/11/2023 CLINICAL DATA:  post-op, foot drop EXAM: MRI LUMBAR SPINE WITHOUT AND WITH CONTRAST TECHNIQUE: Multiplanar and multiecho pulse sequences of the lumbar spine were obtained without and with intravenous contrast. CONTRAST:  8mL GADAVIST GADOBUTROL 1 MMOL/ML IV SOLN COMPARISON:  MR L Spine 06/17/23. FINDINGS: Segmentation:  Standard. Alignment:  Physiologic. Vertebrae: No fracture, evidence of discitis, or bone lesion. L5-S1 laminectomy. Conus medullaris and cauda equina: Conus extends to the L1 level. Conus and cauda equina appear normal. Paraspinal and other soft tissues: Negative. Disc levels: T12-L1: Unremarkable L1-L2: Unremarkable L2-L3: Eccentric left disc bulge. Mild bilateral facet degenerative change. No spinal  canal narrowing. Mild left neural foraminal narrowing. L3-L4: Eccentric left disc bulge. Mild bilateral facet degenerative change. No spinal canal narrowing. Mild left neural foraminal narrowing. L4-L5: Redemonstrated central disc extrusion. There is persistent narrowing of the bilateral lateral recesses, slightly improved from prior exam. Left laminectomy. Mild overall spinal canal narrowing. Moderate bilateral facet degenerative change. Mild right neural foraminal narrowing. L5-S1: Left laminectomy. Eccentric left disc bulge. Mild bilateral facet degenerative change. Mild-to-moderate left and mild right neural foraminal narrowing. There is contrast-enhancing soft tissue along the dorsal lateral and ventral lateral left aspect of the spinal canal at the L5-S1 level (see screen shot). This likely represents postsurgical change. IMPRESSION: 1. Postsurgical changes of left L4-L5 and L5-S1 laminectomies. Contrast-enhancing soft tissue along the dorsal lateral and ventral lateral left aspect of the spinal canal at the L5-S1 level likely represents evolving postsurgical change versus scar tissue. 2. Redemonstrated central disc extrusion at L4-L5 with persistent narrowing of the bilateral lateral recesses, slightly improved from prior exam. 3. Mild-to-moderate left and mild right neural foraminal narrowing at L5-S1. Electronically Signed   By: Lorenza Cambridge M.D.   On: 07/11/2023 10:52    Assessment/Plan: Postop day #1: I will ask PT to mobilize the patient.  He may need rehab.  I have answered all their questions.  LOS: 1 day     Cristi Loron 07/12/2023, 9:01 AM     Patient ID: Raymond Melton, male   DOB: [birth year], 67 y.o.   MRN: [id number]

## 2023-07-12 NOTE — Anesthesia Postprocedure Evaluation (Signed)
Anesthesia Post Note  Patient: Thierno Capretta Bozzo  Procedure(s) Performed: REDO LUMBAR LAMINECTOMY/DECOMPRESSION MICRODISCECTOMY LUMBAR FOUR-FIVE     Patient location during evaluation: PACU Anesthesia Type: General Level of consciousness: awake and alert Pain management: pain level controlled Vital Signs Assessment: post-procedure vital signs reviewed and stable Respiratory status: spontaneous breathing, nonlabored ventilation, respiratory function stable and patient connected to nasal cannula oxygen Cardiovascular status: blood pressure returned to baseline and stable Postop Assessment: no apparent nausea or vomiting Anesthetic complications: no   No notable events documented.  Last Vitals:  Vitals:   07/11/23 2315 07/11/23 2359  BP:  (!) 142/94  Pulse: 80 85  Resp: 11 18  Temp: 36.8 C 36.7 C  SpO2: 98% 96%    Last Pain:  Vitals:   07/11/23 2359  TempSrc: Oral  PainSc:                  Mariann Barter

## 2023-07-12 NOTE — Evaluation (Signed)
Physical Therapy Evaluation Patient Details Name: Raymond Melton MRN: [id number] DOB: [birth year] Today's Date: 07/12/2023  History of Present Illness  67 yo M s/p left laminectomy L4-5, L5-S1 on 8/7.  PMH includes: depression, anxiety, prior C-spine surgery. Pt now readmitted on 07/11/23 for continued radicular symptoms.  Clinical Impression  Pt presents with admitting diagnosis above. Pt today was able to ambulate short distance around room before onset of radicular symptoms. Pt appears to be close to baseline however really would like to go to rehab. Recommend SNF upon DC. PT will continue to follow.       If plan is discharge home, recommend the following: A little help with walking and/or transfers;A little help with bathing/dressing/bathroom;Assistance with cooking/housework;Direct supervision/assist for medications management;Assist for transportation;Help with stairs or ramp for entrance   Can travel by private vehicle   Yes    Equipment Recommendations Other (comment) (Per accepting facility)  Recommendations for Other Services       Functional Status Assessment Patient has had a recent decline in their functional status and demonstrates the ability to make significant improvements in function in a reasonable and predictable amount of time.     Precautions / Restrictions Precautions Precautions: Back Precaution Booklet Issued: No Precaution Comments: Minimize bending, lifting and twisting. Restrictions Weight Bearing Restrictions: No      Mobility  Bed Mobility Overal bed mobility: Needs Assistance Bed Mobility: Supine to Sit, Sit to Supine     Supine to sit: Modified independent (Device/Increase time) Sit to supine: Supervision   General bed mobility comments: Pt requested bed to be very elevated. Required cues for log roll.    Transfers Overall transfer level: Needs assistance Equipment used: Rolling walker (2 wheels) Transfers: Sit to/from Stand Sit to  Stand: Contact guard assist, From elevated surface           General transfer comment: CGA for safety from very elevated bed.    Ambulation/Gait Ambulation/Gait assistance: Contact guard assist Gait Distance (Feet): 50 Feet Assistive device: Rolling walker (2 wheels) Gait Pattern/deviations: Steppage, Decreased stride length, Step-through pattern, Knees buckling, Trunk flexed Gait velocity: Decreased     General Gait Details: Pt with noted L foot drop and high steppage gait to compensate. no LOB noted.  Stairs            Wheelchair Mobility     Tilt Bed    Modified Rankin (Stroke Patients Only)       Balance Overall balance assessment: Needs assistance Sitting-balance support: Feet supported Sitting balance-Leahy Scale: Normal     Standing balance support: Reliant on assistive device for balance Standing balance-Leahy Scale: Fair Standing balance comment: Reliant on RW                             Pertinent Vitals/Pain Pain Assessment Pain Assessment: 0-10 Pain Score: 7  Pain Location: L leg radicular pain Pain Descriptors / Indicators: Tender, Burning, Aching, Nagging Pain Intervention(s): Monitored during session, Limited activity within patient's tolerance, Repositioned, Premedicated before session    Home Living Family/patient expects to be discharged to:: Private residence Living Arrangements: Spouse/significant other Available Help at Discharge: Family;Available 24 hours/day Type of Home: Apartment Home Access: Elevator       Home Layout: One level Home Equipment: Rollator (4 wheels);Shower seat;Cane - single Librarian, academic (2 wheels) Additional Comments: Pt just recently DC from hospital. Pt reports no changes in home setup.    Prior Function Prior Level  of Function : Independent/Modified Independent             Mobility Comments: Pt reports mostly using rollator PTA ADLs Comments: Wife helps with ADLs      Extremity/Trunk Assessment   Upper Extremity Assessment Upper Extremity Assessment: Overall WFL for tasks assessed    Lower Extremity Assessment Lower Extremity Assessment: LLE deficits/detail LLE Deficits / Details: knee buckle and foot drop    Cervical / Trunk Assessment Cervical / Trunk Assessment: Back Surgery  Communication   Communication Communication: No apparent difficulties Cueing Techniques: Verbal cues;Tactile cues  Cognition Arousal: Alert Behavior During Therapy: WFL for tasks assessed/performed Overall Cognitive Status: Within Functional Limits for tasks assessed                                          General Comments General comments (skin integrity, edema, etc.): VSS    Exercises     Assessment/Plan    PT Assessment Patient needs continued PT services  PT Problem List Decreased strength;Decreased range of motion;Decreased activity tolerance;Decreased balance;Decreased mobility;Decreased coordination;Decreased knowledge of use of DME;Decreased safety awareness;Decreased knowledge of precautions;Pain       PT Treatment Interventions DME instruction;Gait training;Stair training;Functional mobility training;Therapeutic activities;Therapeutic exercise;Balance training;Neuromuscular re-education;Patient/family education    PT Goals (Current goals can be found in the Care Plan section)  Acute Rehab PT Goals Patient Stated Goal: to get better PT Goal Formulation: With patient Time For Goal Achievement: 07/26/23 Potential to Achieve Goals: Good    Frequency Min 1X/week     Co-evaluation               AM-PAC PT "6 Clicks" Mobility  Outcome Measure Help needed turning from your back to your side while in a flat bed without using bedrails?: A Little Help needed moving from lying on your back to sitting on the side of a flat bed without using bedrails?: A Little Help needed moving to and from a bed to a chair (including a  wheelchair)?: A Little Help needed standing up from a chair using your arms (e.g., wheelchair or bedside chair)?: A Little Help needed to walk in hospital room?: A Little Help needed climbing 3-5 steps with a railing? : A Lot 6 Click Score: 17    End of Session Equipment Utilized During Treatment: Gait belt Activity Tolerance: Patient tolerated treatment well Patient left: in bed;with call bell/phone within reach Nurse Communication: Mobility status PT Visit Diagnosis: Other abnormalities of gait and mobility (R26.89)    Time: 1324-4010 PT Time Calculation (min) (ACUTE ONLY): 24 min   Charges:   PT Evaluation $PT Eval Moderate Complexity: 1 Mod PT Treatments $Gait Training: 8-22 mins PT General Charges $$ ACUTE PT VISIT: 1 Visit         Shela Nevin, PT, DPT Acute Rehab Services [phone]   Gladys Damme 07/12/2023, 3:15 PM

## 2023-07-12 NOTE — Progress Notes (Signed)
PROGRESS NOTE    Raymond Melton  BJY:[id number] DOB: 1956/01/09 DOA: 07/11/2023 PCP: Galvin Proffer, MD    Brief Narrative:   Raymond Melton is a 67 y.o. male with medical history significant of hypertension, hyperlipidemia, CAD s/p stent,, diabetes mellitus type 2, chronic back pain, radiculopathy, history of hepatitis C and remote history of alcohol abuse who presents with acute worsening of his back pain after laminectomy for hernia nucleus pulposus L4-5 L5-S1 left with left L5 and S1 radiculopathies with Dr. Wynetta Emery on 8/7   Prior to the surgery patient reported that he was able to use his left foot, but since the surgery his left foot drags the ground.  Await PT eval   Assessment and Plan: Postoperative back pain Left foot drop -s/p repeat 4-5 open discectomy  -Physical therapy to evaluate and treat -Appreciate neurosurgery consultative services we will follow-up for any further recommendations   Essential hypertension -Continue amlodipine and metoprolol   Controlled diabetes mellitus type 2, without long-term use of insulin -SSI   CAD Patient with prior stent placed in 01/2018.  Patient reports Plavix has been on hold for procedure.  Denies any complaints of chest pain. -Continue aspirin and statin -Resume Plavix when medically appropriate   Hyperlipidemia  -Continue Crestor   Bipolar disorder PTSD Patient reports being on Xanax/Ativan for anxiety.  PMDP does not show any recent prescriptions. -Low-dose Ativan added on as needed for anxiety/tremors   Elevated ALT Acute.  ALT previously 68 on 8/7 with repeat check today 121.  Patient has a history of hepatitis C -Continue to monitor   History of alcohol abuse Patient reports being sober for the last 5 years. -Encouraged continued cessation of alcohol use.   DVT prophylaxis: enoxaparin (LOVENOX) injection 40 mg Start: 07/11/23 1300    Code Status: Full Code   Disposition Plan:  Level of care: Telemetry  Medical Status is: Inpatient Remains inpatient appropriate    Consultants:  NS   Subjective: No SOB, no CP  Objective: Vitals:   07/11/23 2315 07/11/23 2359 07/12/23 0358 07/12/23 0744  BP:  (!) 142/94 137/80 136/73  Pulse: 80 85 81 97  Resp: 11 18 18 17   Temp: 98.2 F (36.8 C) 98 F (36.7 C) (!) 97.3 F (36.3 C) 97.6 F (36.4 C)  TempSrc:  Oral Oral Oral  SpO2: 98% 96% 95% 95%  Weight:      Height:        Intake/Output Summary (Last 24 hours) at 07/12/2023 1128 Last data filed at 07/12/2023 0865 Gross per 24 hour  Intake 1000 ml  Output 550 ml  Net 450 ml   Filed Weights   07/11/23 0646  Weight: 81.6 kg    Examination:   General: Appearance:    Well developed, well nourished male in no acute distress     Lungs:     Clear to auscultation bilaterally, respirations unlabored  Heart:    Normal heart rate. Normal rhythm. No murmurs, rubs, or gallops.    MS:   All extremities are intact.    Neurologic:   Awake, alert, oriented x 3.       Data Reviewed: I have personally reviewed following labs and imaging studies  CBC: Recent Labs  Lab 07/11/23 0711 07/12/23 0114  WBC 7.4 11.7*  NEUTROABS 4.2  --   HGB 15.3 13.6  HCT 43.8 38.5*  MCV 88.7 89.3  PLT 244 201   Basic Metabolic Panel: Recent Labs  Lab 07/11/23 0711 07/12/23  0114  NA 134* 130*  K 4.0 3.9  CL 99 96*  CO2 24 22  GLUCOSE 163* 228*  BUN 21 21  CREATININE 0.80 0.75  CALCIUM 9.4 8.5*   GFR: Estimated Creatinine Clearance: 99.9 mL/min (by C-G formula based on SCr of 0.75 mg/dL). Liver Function Tests: Recent Labs  Lab 07/11/23 0711  AST 40  ALT 121*  ALKPHOS 114  BILITOT 0.7  PROT 7.0  ALBUMIN 3.8   No results for input(s): "LIPASE", "AMYLASE" in the last 168 hours. No results for input(s): "AMMONIA" in the last 168 hours. Coagulation Profile: No results for input(s): "INR", "PROTIME" in the last 168 hours. Cardiac Enzymes: No results for input(s): "CKTOTAL", "CKMB",  "CKMBINDEX", "TROPONINI" in the last 168 hours. BNP (last 3 results) No results for input(s): "PROBNP" in the last 8760 hours. HbA1C: Recent Labs    07/12/23 0114  HGBA1C 7.0*   CBG: Recent Labs  Lab 07/11/23 2357 07/12/23 0808  GLUCAP 218* 274*   Lipid Profile: No results for input(s): "CHOL", "HDL", "LDLCALC", "TRIG", "CHOLHDL", "LDLDIRECT" in the last 72 hours. Thyroid Function Tests: No results for input(s): "TSH", "T4TOTAL", "FREET4", "T3FREE", "THYROIDAB" in the last 72 hours. Anemia Panel: No results for input(s): "VITAMINB12", "FOLATE", "FERRITIN", "TIBC", "IRON", "RETICCTPCT" in the last 72 hours. Sepsis Labs: No results for input(s): "PROCALCITON", "LATICACIDVEN" in the last 168 hours.  No results found for this or any previous visit (from the past 240 hour(s)).       Radiology Studies: MR Lumbar Spine W Wo Contrast  Result Date: 07/11/2023 CLINICAL DATA:  post-op, foot drop EXAM: MRI LUMBAR SPINE WITHOUT AND WITH CONTRAST TECHNIQUE: Multiplanar and multiecho pulse sequences of the lumbar spine were obtained without and with intravenous contrast. CONTRAST:  8mL GADAVIST GADOBUTROL 1 MMOL/ML IV SOLN COMPARISON:  MR L Spine 06/17/23. FINDINGS: Segmentation:  Standard. Alignment:  Physiologic. Vertebrae: No fracture, evidence of discitis, or bone lesion. L5-S1 laminectomy. Conus medullaris and cauda equina: Conus extends to the L1 level. Conus and cauda equina appear normal. Paraspinal and other soft tissues: Negative. Disc levels: T12-L1: Unremarkable L1-L2: Unremarkable L2-L3: Eccentric left disc bulge. Mild bilateral facet degenerative change. No spinal canal narrowing. Mild left neural foraminal narrowing. L3-L4: Eccentric left disc bulge. Mild bilateral facet degenerative change. No spinal canal narrowing. Mild left neural foraminal narrowing. L4-L5: Redemonstrated central disc extrusion. There is persistent narrowing of the bilateral lateral recesses, slightly improved  from prior exam. Left laminectomy. Mild overall spinal canal narrowing. Moderate bilateral facet degenerative change. Mild right neural foraminal narrowing. L5-S1: Left laminectomy. Eccentric left disc bulge. Mild bilateral facet degenerative change. Mild-to-moderate left and mild right neural foraminal narrowing. There is contrast-enhancing soft tissue along the dorsal lateral and ventral lateral left aspect of the spinal canal at the L5-S1 level (see screen shot). This likely represents postsurgical change. IMPRESSION: 1. Postsurgical changes of left L4-L5 and L5-S1 laminectomies. Contrast-enhancing soft tissue along the dorsal lateral and ventral lateral left aspect of the spinal canal at the L5-S1 level likely represents evolving postsurgical change versus scar tissue. 2. Redemonstrated central disc extrusion at L4-L5 with persistent narrowing of the bilateral lateral recesses, slightly improved from prior exam. 3. Mild-to-moderate left and mild right neural foraminal narrowing at L5-S1. Electronically Signed   By: Lorenza Cambridge M.D.   On: 07/11/2023 10:52        Scheduled Meds:  amLODipine  10 mg Oral Daily   aspirin EC  81 mg Oral Daily   enoxaparin (LOVENOX) injection  40 mg Subcutaneous Q24H   gabapentin  300 mg Oral TID   insulin aspart  0-5 Units Subcutaneous QHS   insulin aspart  0-9 Units Subcutaneous TID WC   methylPREDNISolone  4 mg Oral PC lunch   methylPREDNISolone  4 mg Oral PC supper   methylPREDNISolone  4 mg Oral 3 x daily with food   [START ON 07/13/2023] methylPREDNISolone  4 mg Oral 4X daily taper   methylPREDNISolone  8 mg Oral Nightly   metoprolol succinate  100 mg Oral Daily   rosuvastatin  20 mg Oral Daily   sodium chloride flush  3 mL Intravenous Q12H   Continuous Infusions:   LOS: 1 day    Time spent: 45 minutes spent on chart review, discussion with nursing staff, consultants, updating family and interview/physical exam; more than 50% of that time was spent in  counseling and/or coordination of care.    Joseph Art, DO [facility] Available via Epic secure chat 7am-7pm After these hours, please refer to coverage provider listed on [email or web address] 07/12/2023, 11:28 AM

## 2023-07-13 DIAGNOSIS — G8918 Other acute postprocedural pain: Secondary | ICD-10-CM | POA: Diagnosis not present

## 2023-07-13 DIAGNOSIS — M549 Dorsalgia, unspecified: Secondary | ICD-10-CM | POA: Diagnosis not present

## 2023-07-13 LAB — CBC
HCT: 40.9 % (ref 39.0–52.0)
Hemoglobin: 14.1 g/dL (ref 13.0–17.0)
MCH: 31.1 pg (ref 26.0–34.0)
MCHC: 34.5 g/dL (ref 30.0–36.0)
MCV: 90.3 fL (ref 80.0–100.0)
Platelets: 219 10*3/uL (ref 150–400)
RBC: 4.53 MIL/uL (ref 4.22–5.81)
RDW: 12.5 % (ref 11.5–15.5)
WBC: 10.7 10*3/uL — ABNORMAL HIGH (ref 4.0–10.5)
nRBC: 0 % (ref 0.0–0.2)

## 2023-07-13 LAB — GLUCOSE, CAPILLARY
Glucose-Capillary: 222 mg/dL — ABNORMAL HIGH (ref 70–99)
Glucose-Capillary: 249 mg/dL — ABNORMAL HIGH (ref 70–99)
Glucose-Capillary: 234 mg/dL — ABNORMAL HIGH (ref 70–99)
Glucose-Capillary: 309 mg/dL — ABNORMAL HIGH (ref 70–99)
Glucose-Capillary: 182 mg/dL — ABNORMAL HIGH (ref 70–99)

## 2023-07-13 LAB — BASIC METABOLIC PANEL
Anion gap: 11 (ref 5–15)
BUN: 23 mg/dL (ref 8–23)
CO2: 23 mmol/L (ref 22–32)
Calcium: 9 mg/dL (ref 8.9–10.3)
Chloride: 95 mmol/L — ABNORMAL LOW (ref 98–111)
Creatinine, Ser: 0.94 mg/dL (ref 0.61–1.24)
GFR, Estimated: >60 mL/min (ref >60)
Glucose, Bld: 232 mg/dL — ABNORMAL HIGH (ref 70–99)
Potassium: 4 mmol/L (ref 3.5–5.1)
Sodium: 129 mmol/L — ABNORMAL LOW (ref 135–145)

## 2023-07-13 MED ORDER — HYDROCODONE-ACETAMINOPHEN 10-325 MG PO TABS
1.0000 | ORAL_TABLET | ORAL | Status: DC | PRN
  Administered 2023-07-13: 2 via ORAL
  Administered 2023-07-13: 1 via ORAL
  Administered 2023-07-13: 2 via ORAL
  Administered 2023-07-14 (×3): 1 via ORAL
  Administered 2023-07-15 – 2023-07-17 (×8): 2 via ORAL
  Filled 2023-07-13 (×2): qty 2
  Filled 2023-07-13 (×2): qty 1
  Filled 2023-07-13 (×4): qty 2
  Filled 2023-07-13: qty 1
  Filled 2023-07-13 (×6): qty 2

## 2023-07-13 MED ORDER — DEXAMETHASONE 4 MG PO TABS
4.0000 mg | ORAL_TABLET | Freq: Every day | ORAL | Status: AC
  Administered 2023-07-13 – 2023-07-15 (×3): 4 mg via ORAL
  Filled 2023-07-13 (×3): qty 1

## 2023-07-13 NOTE — Evaluation (Signed)
Occupational Therapy Evaluation Patient Details Name: Raymond Melton MRN: [id number] DOB: October 02, 1956 Today's Date: 07/13/2023   History of Present Illness 67 y.o. male presents to [facility] hospital with worsening symptoms of LLE radiculopathy after recent L4-S1 discectomies. Repeat discectomy on 8/16. PMH includes alcohol abuse, anxiety, OA, depression, DM, GERD, hepatitis C, HTN, TB.   Clinical Impression   PTA, pt recently home from spinal surgery with wife assisting with LB ADL. Prior to recent surgeries was mod I. Upon eval, pt impulsive, pre-occupied with recovery time of surgery and having to receive two procedures. Poor insight into recovery time and decreased safety with transfers, decreased RW compliance despite awareness of balance difficulty, foot drop, and LLE buckling. Pt able to recall 3/3 spinal precautions with min cues. Requiring Mod A for LB ADL, CGA for ambulation. Will continue to follow. Pt very interested in follow up OT <3 hours/day at discharge.       If plan is discharge home, recommend the following: Assist for transportation;Assistance with cooking/housework;A little help with walking and/or transfers;A little help with bathing/dressing/bathroom    Functional Status Assessment  Patient has had a recent decline in their functional status and demonstrates the ability to make significant improvements in function in a reasonable and predictable amount of time.  Equipment Recommendations  None recommended by OT    Recommendations for Other Services       Precautions / Restrictions Precautions Precautions: Back Precaution Booklet Issued: Yes (comment) Precaution Comments: Provided new handout as pt with poor implementation of good body mechanics during mobility Restrictions Weight Bearing Restrictions: No      Mobility Bed Mobility               General bed mobility comments: in recliner on arrival and sititng on couch speaking with Dr. Jake Samples on  departure    Transfers Overall transfer level: Needs assistance Equipment used: Rolling walker (2 wheels) Transfers: Sit to/from Stand Sit to Stand: Contact guard assist, From elevated surface           General transfer comment: CGA for safety; impulsive, supporting self on unsafe surfaces such as rolling bedside table during transfers and poor compliance with RW during ambulation for short distances walking from chair to bed and back without several times.      Balance Overall balance assessment: Needs assistance Sitting-balance support: Feet supported Sitting balance-Leahy Scale: Normal     Standing balance support: Reliant on assistive device for balance Standing balance-Leahy Scale: Fair Standing balance comment: Reliant on RW                           ADL either performed or assessed with clinical judgement   ADL Overall ADL's : Needs assistance/impaired Eating/Feeding: Set up;Sitting   Grooming: Contact guard assist;Standing   Upper Body Bathing: Set up;Sitting   Lower Body Bathing: Minimal assistance;Sit to/from stand   Upper Body Dressing : Set up;Sitting   Lower Body Dressing: Moderate assistance;Sit to/from stand   Toilet Transfer: Contact guard assist;Rolling walker (2 wheels);Ambulation Toilet Transfer Details (indicate cue type and reason): poor initiation of use of RW Toileting- Clothing Manipulation and Hygiene: Set up;Contact guard assist;Sitting/lateral lean;Sit to/from stand       Functional mobility during ADLs: Contact guard assist;Supervision/safety;Rolling walker (2 wheels) General ADL Comments: supervision with RW     Vision Baseline Vision/History: 1 Wears glasses Ability to See in Adequate Light: 3 Highly impaired Patient Visual Report: No change from baseline  Additional Comments: "glass L eye"     Perception Perception: Within Functional Limits       Praxis Praxis: WFL       Pertinent Vitals/Pain Pain  Assessment Pain Assessment: Faces Faces Pain Scale: Hurts little more Pain Location: L leg radicular pain Pain Descriptors / Indicators: Tender, Burning, Aching, Nagging Pain Intervention(s): Limited activity within patient's tolerance, Monitored during session     Extremity/Trunk Assessment Upper Extremity Assessment Upper Extremity Assessment: Generalized weakness;LUE deficits/detail LUE Deficits / Details: Pt wil multiple recent trigger finger surgeries per his report and often drops things. Pt observed with slowed coordination and dexterity as compared to R   Lower Extremity Assessment Lower Extremity Assessment: LLE deficits/detail LLE Deficits / Details: knee buckle and foot drop   Cervical / Trunk Assessment Cervical / Trunk Assessment: Back Surgery   Communication Communication Communication: No apparent difficulties Cueing Techniques: Verbal cues;Tactile cues   Cognition Arousal: Alert Behavior During Therapy: WFL for tasks assessed/performed Overall Cognitive Status: Within Functional Limits for tasks assessed                                 General Comments: Suspect baseline, however, maximally distracted by his own thoughts with difficulty attenting to therapist cues and commands. Needing constant redirection to listen. Pt seems upset with recover time from spinal surgery and perseverative on how he had plans to ride electric scooter, go to beach with camper, etc. Reviewed realistic expectations for simply performing self care and walking for 3+ months and have to wait for strengthening, more strenouos exercise until later. Dr. Jake Samples entering session and confirming with pt also providing further education in regard to expectations for progression which can take upwards of 1 year.     General Comments  VSS    Exercises     Shoulder Instructions      Home Living Family/patient expects to be discharged to:: Private residence Living Arrangements:  Spouse/significant other Available Help at Discharge: Family;Available 24 hours/day Type of Home: Apartment Home Access: Elevator     Home Layout: One level     Bathroom Shower/Tub: Chief Strategy Officer: Standard Bathroom Accessibility: Yes How Accessible: Accessible via walker Home Equipment: Rollator (4 wheels);Shower seat;Cane - single Librarian, academic (2 wheels)   Additional Comments: Pt just recently DC from hospital. Pt reports no changes in home setup.      Prior Functioning/Environment Prior Level of Function : Independent/Modified Independent             Mobility Comments: Pt reports mostly using rollator PTA ADLs Comments: Wife recently helping with LB ADL        OT Problem List: Impaired balance (sitting and/or standing)      OT Treatment/Interventions: Therapeutic exercise;Self-care/ADL training;DME and/or AE instruction;Balance training;Patient/family education;Therapeutic activities    OT Goals(Current goals can be found in the care plan section) Acute Rehab OT Goals Patient Stated Goal: get rehab OT Goal Formulation: With patient Time For Goal Achievement: 07/27/23 Potential to Achieve Goals: Good  OT Frequency: Min 1X/week    Co-evaluation              AM-PAC OT "6 Clicks" Daily Activity     Outcome Measure Help from another person eating meals?: None Help from another person taking care of personal grooming?: A Little Help from another person toileting, which includes using toliet, bedpan, or urinal?: A Little Help from another person bathing (including  washing, rinsing, drying)?: A Little Help from another person to put on and taking off regular upper body clothing?: None Help from another person to put on and taking off regular lower body clothing?: A Little 6 Click Score: 20   End of Session Equipment Utilized During Treatment: Rolling walker (2 wheels) Nurse Communication: Mobility status  Activity Tolerance:  Patient tolerated treatment well Patient left: Other (comment) (on couch in room speaking with Dr. Jake Samples)  OT Visit Diagnosis: Unsteadiness on feet (R26.81)                Time: 6962-9528 OT Time Calculation (min): 34 min Charges:  OT General Charges $OT Visit: 1 Visit OT Evaluation $OT Eval Moderate Complexity: 1 Mod OT Treatments $Self Care/Home Management : 8-22 mins  Tyler Deis, OTR/L [facility] Acute Rehabilitation Office: [phone]   Myrla Halsted 07/13/2023, 9:06 AM

## 2023-07-13 NOTE — TOC Initial Note (Signed)
Transition of Care [facility]) - Initial/Assessment Note    Patient Details  Name: Raymond Melton MRN: [id number] Date of Birth: 1956/07/22  Transition of Care [facility]) CM/SW Contact:    Cathyann Kilfoyle A [place], Theresia Majors Phone Number: 07/13/2023, 10:53 AM  Clinical Narrative:                  CSW met with pt at bedside. He said he was agreeable to SNF placement. SNF process explained. Preference for [facility], stated he has connections as facility and informed them he was recommended for SNF.  SNF workup completed.   TOC will continue to follow.   Expected Discharge Plan: Skilled Nursing Facility Barriers to Discharge: SNF Pending bed offer, Continued Medical Work up, English as a second language teacher   Patient Goals and CMS Choice Patient states their goals for this hospitalization and ongoing recovery are:: get stronger          Expected Discharge Plan and Services In-house Referral: Clinical Social Work     Living arrangements for the past 2 months: Single Family Home                                      Prior Living Arrangements/Services Living arrangements for the past 2 months: Single Family Home Lives with:: Spouse                   Activities of Daily Living Home Assistive Devices/Equipment: Environmental consultant (specify type), Cane (specify quad or straight) ADL Screening (condition at time of admission) Patient's cognitive ability adequate to safely complete daily activities?: Yes Is the patient deaf or have difficulty hearing?: No Does the patient have difficulty seeing, even when wearing glasses/contacts?: Yes Does the patient have difficulty concentrating, remembering, or making decisions?: No Patient able to express need for assistance with ADLs?: Yes Does the patient have difficulty dressing or bathing?: No Independently performs ADLs?: Yes (appropriate for developmental age) Does the patient have difficulty walking or climbing stairs?: Yes Weakness of Legs: Both Weakness  of Arms/Hands: Both  Permission Sought/Granted                  Emotional Assessment Appearance:: Appears stated age Attitude/Demeanor/Rapport: Engaged Affect (typically observed): Pleasant Orientation: : Oriented to Self, Oriented to Place, Oriented to  Time, Oriented to Situation Alcohol / Substance Use: Tobacco Use Psych Involvement: No (comment)  Admission diagnosis:  Postoperative back pain [G89.18, M54.9] Acute post-operative pain [G89.18] Acquired left foot drop [M21.372] Patient Active Problem List   Diagnosis Date Noted   Postoperative back pain 07/11/2023   Left foot drop 07/11/2023   History of alcohol abuse 07/11/2023   Elevated alanine aminotransferase (ALT) level 07/11/2023   HNP (herniated nucleus pulposus), lumbar 07/02/2023   Tobacco abuse 09/27/2020   Alcohol withdrawal (HCC) 07/03/2018   Malnutrition of moderate degree 07/03/2018   Hyperlipidemia 02/12/2018   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)    Chest pain with moderate risk of acute coronary syndrome 02/04/2018   Allergic reaction to contrast media 02/04/2018   Other psychoactive substance use, 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) 12/26/2017   PTSD (post-traumatic stress disorder)    Bipolar affective disorder, current episode mixed (HCC)    Altered mental status 12/17/2017   Ulnar neuropathy at elbow of right upper extremity 06/17/2017   Neck pain on right side 05/23/2017   Carpal tunnel  syndrome of right wrist 05/22/2017   Cervical myofascial pain syndrome 05/22/2017   Cervical radiculopathy 05/22/2017   Chronic pancreatitis (HCC) 05/22/2017   Hand weakness 05/22/2017   Hepatitis C, chronic (HCC) 05/22/2017   History of eye enucleation 05/22/2017   S/P cervical spinal fusion 05/22/2017   Opioid use disorder, severe, dependence (HCC) 11/26/2016   History of neck surgery 11/26/2016   Hx of  hepatitis C 11/26/2016   Hypertension 11/26/2016   Diabetes (HCC) 11/26/2016   MDD (major depressive disorder), recurrent severe, 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   Delirium 10/31/2014   Nausea 10/31/2014   Right upper quadrant pain 06/26/2013   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   PCP:  Galvin Proffer, MD Pharmacy:   [facility] [phone] - [place] Levan, [place] - [address] AT [facility] [address] [place] [address] Phone: [phone] Fax: [phone]     Social Determinants of Health (SDOH) Social History: SDOH Screenings   Food Insecurity: Patient Declined (07/11/2023)  Housing: Patient Declined (07/11/2023)  Transportation Needs: Patient Declined (07/11/2023)  Utilities: Not At Risk (07/11/2023)  Alcohol Screen: Low Risk  (12/23/2017)  Recent Concern: Alcohol Screen - Medium Risk (10/03/2017)  Tobacco Use: Medium Risk (07/11/2023)   SDOH Interventions:     Readmission Risk Interventions     No data to display

## 2023-07-13 NOTE — Progress Notes (Signed)
Patient refusing bed alarm and floor mats. Also, has bed elevated and refusing to put down. RN educated patient on risk of refusing safety measures.

## 2023-07-13 NOTE — Progress Notes (Signed)
   Providing Compassionate, Quality Care - Together  NEUROSURGERY PROGRESS NOTE   S: No issues overnight.  Having some difficulty sleeping due to steroids  O: EXAM:  BP (!) 155/84 (BP Location: Left Arm)   Pulse 99   Temp 98 F (36.7 C) (Oral)   Resp 20   Ht 6\' 1"  (1.854 m)   Wt 81.6 kg   SpO2 100%   BMI 23.75 kg/m   Awake, alert, oriented  PERRL Speech fluent, appropriate  CNs grossly intact  5/5 BUE Right lower extremity full strength Left lower extremity 0/5 EHL/TA otherwise full strength  ASSESSMENT:  67 y.o. male with   Status post L4-5 revision microdiscectomy  PLAN: -Rehab pending -Will change his steroid dosing -Hydrocodone for pain control, states oxycodone is not working    Thank you for allowing me to participate in this patient's care.  Please do not hesitate to call with questions or concerns.   Monia Pouch, DO Neurosurgeon [facility] Cell: [phone]

## 2023-07-13 NOTE — Progress Notes (Signed)
PROGRESS NOTE    Raymond Melton  XBM:[id number] DOB: 23-Apr-1956 DOA: 07/11/2023 PCP: Galvin Proffer, MD    Brief Narrative:   Raymond Melton is a 67 y.o. male with medical history significant of hypertension, hyperlipidemia, CAD s/p stent,, diabetes mellitus type 2, chronic back pain, radiculopathy, history of hepatitis C and remote history of alcohol abuse who presents with acute worsening of his back pain after laminectomy for hernia nucleus pulposus L4-5 L5-S1 left with left L5 and S1 radiculopathies with Dr. Wynetta Emery on 8/7   Prior to the surgery patient reported that he was able to use his left foot, but since the surgery his left foot drags the ground.  PT recommends SNF?   Assessment and Plan: Postoperative back pain Left foot drop -s/p repeat 4-5 open discectomy  -Physical therapy to evaluate and treat -Appreciate neurosurgery consultative services we will follow-up for any further recommendations   Essential hypertension -Continue amlodipine and metoprolol   Controlled diabetes mellitus type 2, without long-term use of insulin -SSI   CAD Patient with prior stent placed in 01/2018.  Patient reports Plavix has been on hold for procedure.  Denies any complaints of chest pain. -Continue aspirin and statin -Resume Plavix when medically appropriate   Hyperlipidemia  -Continue Crestor   Bipolar disorder PTSD Patient reports being on Xanax/Ativan for anxiety.  PMDP does not show any recent prescriptions. -Low-dose Ativan added on as needed for anxiety/tremors   Elevated ALT Acute.  ALT previously 68 on 8/7 with repeat check today 121.  Patient has a history of hepatitis C -Continue to monitor   History of alcohol abuse Patient reports being sober for the last 5 years. -Encouraged continued cessation of alcohol use.   DVT prophylaxis: enoxaparin (LOVENOX) injection 40 mg Start: 07/11/23 1300    Code Status: Full Code   Disposition Plan:  Level of care:  Med-Surg Status is: Inpatient Remains inpatient appropriate- pending TOC d/c plan    Consultants:  NS   Subjective: Up walking in the hallway  Objective: Vitals:   07/12/23 1409 07/12/23 2105 07/13/23 0525 07/13/23 0727  BP: 135/81 (!) 140/80 115/77 (!) 155/84  Pulse: 92 79 89 99  Resp: 17 18 16 20   Temp: 97.6 F (36.4 C) 98 F (36.7 C) 98.2 F (36.8 C) 98 F (36.7 C)  TempSrc: Oral Oral Oral Oral  SpO2: 96% 98% 96% 100%  Weight:      Height:        Intake/Output Summary (Last 24 hours) at 07/13/2023 1028 Last data filed at 07/12/2023 2102 Gross per 24 hour  Intake 240 ml  Output 175 ml  Net 65 ml   Filed Weights   07/11/23 0646  Weight: 81.6 kg    Examination:    General: Appearance:    Well developed, well nourished male in no acute distress     Lungs:     respirations unlabored  Heart:    Normal heart rate. Normal rhythm. No murmurs, rubs, or gallops.   MS:   All extremities are intact.   Neurologic:   Awake, alert, oriented x 3. No apparent focal neurological           defect.        Data Reviewed: I have personally reviewed following labs and imaging studies  CBC: Recent Labs  Lab 07/11/23 0711 07/12/23 0114 07/13/23 0458  WBC 7.4 11.7* 10.7*  NEUTROABS 4.2  --   --   HGB 15.3 13.6 14.1  HCT  43.8 38.5* 40.9  MCV 88.7 89.3 90.3  PLT 244 201 219   Basic Metabolic Panel: Recent Labs  Lab 07/11/23 0711 07/12/23 0114 07/13/23 0458  NA 134* 130* 129*  K 4.0 3.9 4.0  CL 99 96* 95*  CO2 24 22 23   GLUCOSE 163* 228* 232*  BUN 21 21 23   CREATININE 0.80 0.75 0.94  CALCIUM 9.4 8.5* 9.0   GFR: Estimated Creatinine Clearance: 85 mL/min (by C-G formula based on SCr of 0.94 mg/dL). Liver Function Tests: Recent Labs  Lab 07/11/23 0711  AST 40  ALT 121*  ALKPHOS 114  BILITOT 0.7  PROT 7.0  ALBUMIN 3.8   No results for input(s): "LIPASE", "AMYLASE" in the last 168 hours. No results for input(s): "AMMONIA" in the last 168  hours. Coagulation Profile: No results for input(s): "INR", "PROTIME" in the last 168 hours. Cardiac Enzymes: No results for input(s): "CKTOTAL", "CKMB", "CKMBINDEX", "TROPONINI" in the last 168 hours. BNP (last 3 results) No results for input(s): "PROBNP" in the last 8760 hours. HbA1C: Recent Labs    07/12/23 0114  HGBA1C 7.0*   CBG: Recent Labs  Lab 07/12/23 1131 07/12/23 1605 07/12/23 2059 07/12/23 2213 07/13/23 0755  GLUCAP 234* 254* 213* 222* 249*   Lipid Profile: No results for input(s): "CHOL", "HDL", "LDLCALC", "TRIG", "CHOLHDL", "LDLDIRECT" in the last 72 hours. Thyroid Function Tests: No results for input(s): "TSH", "T4TOTAL", "FREET4", "T3FREE", "THYROIDAB" in the last 72 hours. Anemia Panel: No results for input(s): "VITAMINB12", "FOLATE", "FERRITIN", "TIBC", "IRON", "RETICCTPCT" in the last 72 hours. Sepsis Labs: No results for input(s): "PROCALCITON", "LATICACIDVEN" in the last 168 hours.  No results found for this or any previous visit (from the past 240 hour(s)).       Radiology Studies: No results found.      Scheduled Meds:  amLODipine  10 mg Oral Daily   aspirin EC  81 mg Oral Daily   dexamethasone  4 mg Oral Daily   enoxaparin (LOVENOX) injection  40 mg Subcutaneous Q24H   gabapentin  300 mg Oral TID   insulin aspart  0-5 Units Subcutaneous QHS   insulin aspart  0-9 Units Subcutaneous TID WC   metoprolol succinate  100 mg Oral Daily   rosuvastatin  20 mg Oral Daily   sodium chloride flush  3 mL Intravenous Q12H   Continuous Infusions:   LOS: 2 days    Time spent: 45 minutes spent on chart review, discussion with nursing staff, consultants, updating family and interview/physical exam; more than 50% of that time was spent in counseling and/or coordination of care.    Joseph Art, DO [facility] Available via Epic secure chat 7am-7pm After these hours, please refer to coverage provider listed on [email or web address] 07/13/2023,  10:28 AM

## 2023-07-13 NOTE — Progress Notes (Signed)
Name: Raymond Melton DOB: 1956-05-15  Please be advised that the above-named patient will require a short-term nursing home stay -- anticipated 30 days or less for rehabilitation and strengthening. The plan is for return home.

## 2023-07-13 NOTE — NC FL2 (Signed)
[place] MEDICAID FL2 LEVEL OF CARE FORM     IDENTIFICATION  Patient Name: Raymond Melton Birthdate: August 20, 1956 Sex: male Admission Date (Current Location): 07/11/2023  [facility] and [place] Number:  Producer, television/film/video and Address:  [facility] [place], [address], [place], [place] [address]      Provider Number: [id number]  Attending Physician Name and Address:  Joseph Art, DO  Relative Name and Phone Number:  Massman,karen (Spouse)  [phone]    Current Level of Care: Hospital Recommended Level of Care: Skilled Nursing Facility Prior Approval Number:    Date Approved/Denied:   PASRR Number: screen still running  Discharge Plan: SNF    Current Diagnoses: Patient Active Problem List   Diagnosis Date Noted   Postoperative back pain 07/11/2023   Left foot drop 07/11/2023   History of alcohol abuse 07/11/2023   Elevated alanine aminotransferase (ALT) level 07/11/2023   HNP (herniated nucleus pulposus), lumbar 07/02/2023   Tobacco abuse 09/27/2020   Alcohol withdrawal (HCC) 07/03/2018   Malnutrition of moderate degree 07/03/2018   Hyperlipidemia 02/12/2018   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)    Chest pain with moderate risk of acute coronary syndrome 02/04/2018   Allergic reaction to contrast media 02/04/2018   Other psychoactive substance use, 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) 12/26/2017   PTSD (post-traumatic stress disorder)    Bipolar affective disorder, current episode mixed (HCC)    Altered mental status 12/17/2017   Ulnar neuropathy at elbow of right upper extremity 06/17/2017   Neck pain on right side 05/23/2017   Carpal tunnel syndrome of right wrist 05/22/2017   Cervical myofascial pain syndrome 05/22/2017   Cervical radiculopathy 05/22/2017   Chronic pancreatitis (HCC) 05/22/2017   Hand weakness 05/22/2017   Hepatitis  C, chronic (HCC) 05/22/2017   History of eye enucleation 05/22/2017   S/P cervical spinal fusion 05/22/2017   Opioid use disorder, severe, dependence (HCC) 11/26/2016   History of neck surgery 11/26/2016   Hx of hepatitis C 11/26/2016   Hypertension 11/26/2016   Diabetes (HCC) 11/26/2016   MDD (major depressive disorder), recurrent severe, 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   Delirium 10/31/2014   Nausea 10/31/2014   Right upper quadrant pain 06/26/2013   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    Orientation RESPIRATION BLADDER Height & Weight     Self, Time, Situation, Place  Normal Continent Weight: 180 lb (81.6 kg) Height:  6\' 1"  (185.4 cm)  BEHAVIORAL SYMPTOMS/MOOD NEUROLOGICAL BOWEL NUTRITION STATUS      Continent Diet (see discharge summary)  AMBULATORY STATUS COMMUNICATION OF NEEDS Skin   Limited Assist Verbally Other (Comment) (Incision (Closed) 07/11/23 Vertebral column Lower;Mid, Incision (Closed) 07/11/23 Back)                       Personal Care Assistance Level of Assistance  Bathing, Feeding, Dressing Bathing Assistance: Limited assistance Feeding assistance: Independent Dressing Assistance: Limited assistance     Functional Limitations Info  Sight, Hearing, Speech Sight Info: Impaired (wears glasses, blind) Hearing Info: Impaired Speech Info: Adequate    SPECIAL CARE FACTORS FREQUENCY  PT (By licensed PT), OT (By licensed OT)     PT Frequency: 5x/week OT Frequency: 5x/week            Contractures Contractures Info: Not present    Additional Factors Info  Code Status, Allergies, Insulin Sliding Scale, Psychotropic Code Status Info: FULL Allergies Info:  Amipaque (Metrizamide)  Iodinated Contrast Media  Iodine  Iohexol  Ioxaglate  Other  Codeine  Ambien (Zolpidem)  Diovan (Valsartan)  Glucophage (Metformin)  Latex  Reglan (Metoclopramide)  Remeron (Mirtazapine)  Tuberculin Tests  Tuberculin, Ppd  Zestril (Lisinopril) Psychotropic Info: gabapentin  (NEURONTIN) capsule 300 mg  Dose: 300 mg  Freq: 3 times daily Route: PO Insulin Sliding Scale Info: see discharge summary       Current Medications (07/13/2023):  This is the current hospital active medication list Current Facility-Administered Medications  Medication Dose Route Frequency Provider Last Rate Last Admin   acetaminophen (TYLENOL) tablet 650 mg  650 mg Oral Q6H PRN Clydie Braun, MD       Or   acetaminophen (TYLENOL) suppository 650 mg  650 mg Rectal Q6H PRN Madelyn Flavors A, MD       albuterol (PROVENTIL) (2.5 MG/3ML) 0.083% nebulizer solution 2.5 mg  2.5 mg Nebulization Q6H PRN Katrinka Blazing, Rondell A, MD       amLODipine (NORVASC) tablet 10 mg  10 mg Oral Daily Katrinka Blazing, Rondell A, MD   10 mg at 07/13/23 0853   aspirin EC tablet 81 mg  81 mg Oral Daily Smith, Rondell A, MD   81 mg at 07/13/23 0853   dexamethasone (DECADRON) tablet 4 mg  4 mg Oral Daily Dawley, Troy C, DO   4 mg at 07/13/23 1036   enoxaparin (LOVENOX) injection 40 mg  40 mg Subcutaneous Q24H Smith, Rondell A, MD   40 mg at 07/12/23 1332   gabapentin (NEURONTIN) capsule 300 mg  300 mg Oral TID Loetta Rough, MD   300 mg at 07/13/23 0853   HYDROcodone-acetaminophen (NORCO) 10-325 MG per tablet 1-2 tablet  1-2 tablet Oral Q4H PRN Dawley, Troy C, DO   2 tablet at 07/13/23 1036   HYDROmorphone (DILAUDID) injection 1 mg  1 mg Intravenous Q3H PRN Madelyn Flavors A, MD   1 mg at 07/13/23 0348   insulin aspart (novoLOG) injection 0-5 Units  0-5 Units Subcutaneous QHS Madelyn Flavors A, MD   2 Units at 07/12/23 2223   insulin aspart (novoLOG) injection 0-9 Units  0-9 Units Subcutaneous TID WC Madelyn Flavors A, MD   3 Units at 07/13/23 2130   linaclotide (LINZESS) capsule 72 mcg  72 mcg Oral Daily PRN Madelyn Flavors A, MD       LORazepam (ATIVAN) tablet 0.5 mg  0.5 mg Oral Daily PRN Madelyn Flavors A, MD       melatonin tablet 10 mg  10 mg Oral QHS PRN Madelyn Flavors A, MD   10 mg at 07/11/23 2345   metoprolol succinate  (TOPROL-XL) 24 hr tablet 100 mg  100 mg Oral Daily Smith, Rondell A, MD   100 mg at 07/13/23 0852   naloxone (NARCAN) injection 0.4 mg  0.4 mg Intravenous PRN Madelyn Flavors A, MD       ondansetron (ZOFRAN) tablet 4 mg  4 mg Oral Q6H PRN Madelyn Flavors A, MD   4 mg at 07/11/23 1416   Or   ondansetron (ZOFRAN) injection 4 mg  4 mg Intravenous Q6H PRN Madelyn Flavors A, MD   4 mg at 07/12/23 1042   prochlorperazine (COMPAZINE) injection 10 mg  10 mg Intravenous Q6H PRN Madelyn Flavors A, MD   10 mg at 07/11/23 1703   rosuvastatin (CRESTOR) tablet 20 mg  20 mg Oral Daily Katrinka Blazing, Rondell A, MD   20 mg at 07/13/23 0853   sodium chloride flush (NS)  0.9 % injection 3 mL  3 mL Intravenous Q12H Smith, Rondell A, MD   3 mL at 07/13/23 0856   tiZANidine (ZANAFLEX) tablet 4 mg  4 mg Oral TID PRN Clydie Braun, MD   4 mg at 07/13/23 1610     Discharge Medications: Please see discharge summary for a list of discharge medications.  Relevant Imaging Results:  Relevant Lab Results:   Additional Information [id number]  Adolphe Fortunato A [place], [place]

## 2023-07-14 DIAGNOSIS — G8918 Other acute postprocedural pain: Secondary | ICD-10-CM | POA: Diagnosis not present

## 2023-07-14 DIAGNOSIS — M549 Dorsalgia, unspecified: Secondary | ICD-10-CM | POA: Diagnosis not present

## 2023-07-14 LAB — BASIC METABOLIC PANEL
Anion gap: 11 (ref 5–15)
BUN: 27 mg/dL — ABNORMAL HIGH (ref 8–23)
CO2: 24 mmol/L (ref 22–32)
Calcium: 9 mg/dL (ref 8.9–10.3)
Chloride: 95 mmol/L — ABNORMAL LOW (ref 98–111)
Creatinine, Ser: 0.79 mg/dL (ref 0.61–1.24)
GFR, Estimated: >60 mL/min (ref >60)
Glucose, Bld: 219 mg/dL — ABNORMAL HIGH (ref 70–99)
Potassium: 4 mmol/L (ref 3.5–5.1)
Sodium: 130 mmol/L — ABNORMAL LOW (ref 135–145)

## 2023-07-14 LAB — CBC
HCT: 41.1 % (ref 39.0–52.0)
Hemoglobin: 14.1 g/dL (ref 13.0–17.0)
MCH: 31.1 pg (ref 26.0–34.0)
MCHC: 34.3 g/dL (ref 30.0–36.0)
MCV: 90.7 fL (ref 80.0–100.0)
Platelets: 225 10*3/uL (ref 150–400)
RBC: 4.53 MIL/uL (ref 4.22–5.81)
RDW: 12.7 % (ref 11.5–15.5)
WBC: 13.9 10*3/uL — ABNORMAL HIGH (ref 4.0–10.5)
nRBC: 0 % (ref 0.0–0.2)

## 2023-07-14 LAB — GLUCOSE, CAPILLARY
Glucose-Capillary: 192 mg/dL — ABNORMAL HIGH (ref 70–99)
Glucose-Capillary: 193 mg/dL — ABNORMAL HIGH (ref 70–99)
Glucose-Capillary: 266 mg/dL — ABNORMAL HIGH (ref 70–99)

## 2023-07-14 NOTE — Plan of Care (Signed)
  Problem: Education: Goal: Understanding of discharge needs will improve Outcome: Progressing   Problem: Pain Management: Goal: Pain level will decrease Outcome: Progressing

## 2023-07-14 NOTE — TOC Progression Note (Signed)
Transition of Care [facility]) - Progression Note    Patient Details  Name: Raymond Melton MRN: [id number] Date of Birth: [birth year]  [facility]) CM/SW Contact  Lorri Frederick, LCSW Phone Number: 07/14/2023, 1:38 PM  Clinical Narrative:    CSW reached out to [facility]--they are full,unable to offer bed.    Bed offers provided to pt and wife. They are requesting response from [facility] and from [facility].     Expected Discharge Plan: Skilled Nursing Facility Barriers to Discharge: SNF Pending bed offer, Continued Medical Work up, English as a second language teacher  Expected Discharge Plan and Services In-house Referral: Clinical Social Work     Living arrangements for the past 2 months: Single Family Home                                       Social Determinants of Health (SDOH) Interventions SDOH Screenings   Food Insecurity: Patient Declined (07/11/2023)  Housing: Patient Declined (07/11/2023)  Transportation Needs: Patient Declined (07/11/2023)  Utilities: Not At Risk (07/11/2023)  Alcohol Screen: Low Risk  (12/23/2017)  Recent Concern: Alcohol Screen - Medium Risk (10/03/2017)  Tobacco Use: Medium Risk (07/11/2023)    Readmission Risk Interventions     No data to display

## 2023-07-14 NOTE — Progress Notes (Signed)
Mobility Specialist: Progress Note   07/14/23 1114  Mobility  Activity Ambulated with assistance in hallway  Level of Assistance Contact guard assist, steadying assist  Assistive Device Four wheel walker  Distance Ambulated (ft) 160 ft  Activity Response Tolerated well  Mobility Referral Yes  $Mobility charge 1 Mobility  Mobility Specialist Start Time (ACUTE ONLY) 1025  Mobility Specialist Stop Time (ACUTE ONLY) 1108  Mobility Specialist Time Calculation (min) (ACUTE ONLY) 43 min    Pt was agreeable to mobility session - received in bed. SV for bed mobility, CG for transfer and ambulation. Required physical assistance for L foot drop splint. Pt was very impulsive during ambulation and required a few verbal cues to stay on task. Observed slight instability in gait during ambulation but no LOB. Pt took 3x standing breaks (~10 sec each) d/t LLE and back pain. Pt  described pain as a "shooting" pain through his lower back down his LLE. Also had c/o of mild R foot tingling but stated this was baseline for him. Returned to room without fault. Pt was instructed multiple times on how to put on splint and given printed instructions. Left on EOB with all needs met, call bell in reach.   Maurene Capes Mobility Specialist Please contact via SecureChat or Rehab office at [phone]

## 2023-07-14 NOTE — Care Management Important Message (Signed)
Important Message  Patient Details  Name: Raymond Melton MRN: [id number] Date of Birth: Oct 21, 1956   Medicare Important Message Given:  Yes     Sherilyn Banker 07/14/2023, 2:56 PM

## 2023-07-14 NOTE — Progress Notes (Signed)
PROGRESS NOTE    Raymond Melton  ZOX:[id number] DOB: 1955/11/30 DOA: 07/11/2023  PCP: Galvin Proffer, MD   Brief Narrative:  This 67 y.o. male with medical history significant of hypertension, hyperlipidemia, CAD s/p stent,, diabetes mellitus type 2, chronic back pain, radiculopathy, history of hepatitis C and remote history of alcohol abuse who presents with acute worsening of his back pain after laminectomy for hernia nucleus pulposus L4-5 L5-S1 left with left L5 and S1 radiculopathies with Dr. Wynetta Emery on 8/7   Prior to the surgery patient reported that he was able to use his left foot, but since the surgery his left foot drags the ground.  PT recommends SNF.  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  Physical therapy to evaluate and treat Appreciate neurosurgery consultative services,  we will follow-up for any further recommendations. Continue Decadron 4 mg daily.   Essential hypertension: Continue amlodipine and metoprolol.   Controlled diabetes mellitus type 2, without long-term use of insulin Continue RSSI. Carb modified diet.   CAD: Patient with prior stent placed in 01/2018.   Patient reports Plavix has been on hold for procedure.  Denies any complaints of chest pain. Continue aspirin and statin Resume Plavix when medically appropriate.   Hyperlipidemia: Continue Crestor.   Bipolar disorder: PTSD: Patient reports being on Xanax/Ativan for anxiety.   PMDP does not show any recent  prescriptions. Start Low-dose Ativan added on as needed for anxiety/tremors   Elevated ALT: Acute.  ALT previously 68 on 8/7 with repeat check today 121.   Patient has a history of hepatitis C -Continue to monitor   History of alcohol abuse: Patient reports being sober for the last 5 years. Encouraged continued cessation of alcohol use.     DVT prophylaxis: Lovenox Code Status: Full code Family Communication: No family at bed side. Disposition Plan:   Status is: Inpatient Remains inpatient appropriate because: Admitted for postoperative back pain.  Orthopedics is consulted    Consultants:  Orthopedics  Procedures: None  Antimicrobials:  Anti-infectives (From admission, onward)    Start     Dose/Rate Route Frequency Ordered Stop   07/11/23 1530  ceFAZolin (ANCEF) IVPB 2g/100 mL premix  Status:  Discontinued        2 g 200 mL/hr over 30 Minutes Intravenous 30 min pre-op 07/11/23 1444 07/11/23 2304       Subjective: Patient was seen and examined at bedside.  Patient was sitting on the recliner,  states pain is reasonably controlled.  Objective: Vitals:   07/14/23 0330 07/14/23 0445 07/14/23 0718 07/14/23 1520  BP: (!) 172/155 (!) 146/85 119/76 (!) 149/84  Pulse: 89 80 74 82  Resp: 18  19 20   Temp: 97.8 F (36.6 C)  97.6 F (36.4 C) 97.6 F (36.4 C)  TempSrc: Oral  Oral Oral  SpO2: 99%  95% 97%  Weight:      Height:       No intake or output data in the 24 hours ending 07/14/23 1546 Filed Weights   07/11/23 0646  Weight: 81.6 kg    Examination:  General exam: Appears calm and comfortable, deconditioned, not in any acute distress. Respiratory system: Clear to auscultation. Respiratory effort normal.  RR 16 Cardiovascular system: S1 & S2 heard, RRR. No JVD, murmurs, rubs, gallops or clicks. No pedal edema. Gastrointestinal system: Abdomen is nondistended, soft and nontender. Normal bowel sounds heard. Central nervous system: Alert and oriented x 3 . No  focal neurological deficits. Extremities: No edema, no cyanosis, no clubbing. Skin: No rashes, lesions or ulcers Psychiatry: Judgement and insight appear normal. Mood & affect appropriate.     Data Reviewed: I have personally reviewed following labs and imaging studies  CBC: Recent Labs  Lab 07/11/23 0711 07/12/23 0114 07/13/23 0458 07/14/23 0346  WBC 7.4 11.7* 10.7* 13.9*  NEUTROABS 4.2  --   --   --   HGB 15.3 13.6 14.1 14.1  HCT 43.8 38.5* 40.9 41.1  MCV 88.7 89.3 90.3 90.7  PLT 244 201 219 225   Basic Metabolic Panel: Recent Labs  Lab 07/11/23 0711 07/12/23 0114 07/13/23 0458 07/14/23 0346  NA 134* 130* 129* 130*  K 4.0 3.9 4.0 4.0  CL 99 96* 95* 95*  CO2 24 22 23 24   GLUCOSE 163* 228* 232* 219*  BUN 21 21 23  27*  CREATININE 0.80 0.75 0.94 0.79  CALCIUM 9.4 8.5* 9.0 9.0   GFR: Estimated Creatinine Clearance: 99.9 mL/min (by C-G formula based on SCr of 0.79 mg/dL). Liver Function Tests: Recent Labs  Lab 07/11/23 0711  AST 40  ALT 121*  ALKPHOS 114  BILITOT 0.7  PROT 7.0  ALBUMIN 3.8   No results for input(s): "LIPASE", "AMYLASE" in the last 168 hours. No results for input(s): "AMMONIA" in the last 168 hours. Coagulation Profile: No results for input(s): "INR", "PROTIME" in the last 168 hours. Cardiac Enzymes: No results for input(s): "CKTOTAL", "CKMB", "CKMBINDEX", "TROPONINI" in the last 168 hours. BNP (last 3 results) No results for input(s): "PROBNP" in the last 8760 hours. HbA1C: Recent Labs    07/12/23 0114  HGBA1C 7.0*   CBG: Recent Labs  Lab 07/13/23 0755 07/13/23 1100 07/13/23 1604 07/13/23 2112 07/14/23 0716  GLUCAP 249* 234* 309* 182* 193*   Lipid Profile: No results for input(s): "CHOL", "HDL", "LDLCALC", "TRIG", "CHOLHDL", "LDLDIRECT" in the last 72 hours. Thyroid Function Tests: No results for input(s): "TSH", "T4TOTAL", "FREET4", "T3FREE", "THYROIDAB" in the last 72 hours. Anemia Panel: No results for input(s):  "VITAMINB12", "FOLATE", "FERRITIN", "TIBC", "IRON", "RETICCTPCT" in the last 72 hours. Sepsis Labs: No results for input(s): "PROCALCITON", "LATICACIDVEN" in the last 168 hours.  No results found for this or any previous visit (from the past 240 hour(s)).   Radiology Studies: No results found.  Scheduled Meds:  amLODipine  10 mg Oral Daily   aspirin EC  81 mg Oral Daily   dexamethasone  4 mg Oral Daily   enoxaparin (LOVENOX) injection  40 mg Subcutaneous Q24H   gabapentin  300 mg Oral TID   insulin aspart  0-5 Units Subcutaneous QHS   insulin aspart  0-9 Units Subcutaneous TID WC   metoprolol succinate  100 mg Oral Daily   rosuvastatin  20 mg Oral Daily   sodium chloride flush  3 mL Intravenous Q12H   Continuous Infusions:   LOS: 3 days    Time spent: 50 Mins    Willeen Niece, MD [facility]   If 7PM-7AM, please contact night-coverage

## 2023-07-14 NOTE — Plan of Care (Signed)
  Problem: Education: Goal: Ability to verbalize activity precautions or restrictions will improve Outcome: Progressing Goal: Knowledge of the prescribed therapeutic regimen will improve Outcome: Progressing Goal: Understanding of discharge needs will improve Outcome: Progressing   Problem: Activity: Goal: Ability to avoid complications of mobility impairment will improve Outcome: Progressing Goal: Ability to tolerate increased activity will improve Outcome: Progressing Goal: Will remain free from falls Outcome: Progressing   Problem: Bowel/Gastric: Goal: Gastrointestinal status for postoperative course will improve Outcome: Progressing   Problem: Clinical Measurements: Goal: Ability to maintain clinical measurements within normal limits will improve Outcome: Progressing Goal: Postoperative complications will be avoided or minimized Outcome: Progressing Goal: Diagnostic test results will improve Outcome: Progressing   Problem: Pain Management: Goal: Pain level will decrease Outcome: Progressing   Problem: Skin Integrity: Goal: Will show signs of wound healing Outcome: Progressing   Problem: Health Behavior/Discharge Planning: Goal: Identification of resources available to assist in meeting health care needs will improve Outcome: Progressing   Problem: Bladder/Genitourinary: Goal: Urinary functional status for postoperative course will improve Outcome: Progressing   Problem: Education: Goal: Knowledge of General Education information will improve Description: Including pain rating scale, medication(s)/side effects and non-pharmacologic comfort measures Outcome: Progressing   Problem: Health Behavior/Discharge Planning: Goal: Ability to manage health-related needs will improve Outcome: Progressing   Problem: Clinical Measurements: Goal: Ability to maintain clinical measurements within normal limits will improve Outcome: Progressing Goal: Will remain free from  infection Outcome: Progressing Goal: Diagnostic test results will improve Outcome: Progressing Goal: Respiratory complications will improve Outcome: Progressing Goal: Cardiovascular complication will be avoided Outcome: Progressing   Problem: Activity: Goal: Risk for activity intolerance will decrease Outcome: Progressing   Problem: Nutrition: Goal: Adequate nutrition will be maintained Outcome: Progressing   Problem: Coping: Goal: Level of anxiety will decrease Outcome: Progressing   Problem: Elimination: Goal: Will not experience complications related to bowel motility Outcome: Progressing Goal: Will not experience complications related to urinary retention Outcome: Progressing   Problem: Pain Managment: Goal: General experience of comfort will improve Outcome: Progressing   Problem: Safety: Goal: Ability to remain free from injury will improve Outcome: Progressing   Problem: Skin Integrity: Goal: Risk for impaired skin integrity will decrease Outcome: Progressing   Problem: Education: Goal: Ability to describe self-care measures that may prevent or decrease complications (Diabetes Survival Skills Education) will improve Outcome: Progressing Goal: Individualized Educational Video(s) Outcome: Progressing   Problem: Coping: Goal: Ability to adjust to condition or change in health will improve Outcome: Progressing   Problem: Fluid Volume: Goal: Ability to maintain a balanced intake and output will improve Outcome: Progressing   Problem: Health Behavior/Discharge Planning: Goal: Ability to identify and utilize available resources and services will improve Outcome: Progressing Goal: Ability to manage health-related needs will improve Outcome: Progressing   Problem: Metabolic: Goal: Ability to maintain appropriate glucose levels will improve Outcome: Progressing   Problem: Nutritional: Goal: Maintenance of adequate nutrition will improve Outcome:  Progressing Goal: Progress toward achieving an optimal weight will improve Outcome: Progressing   Problem: Skin Integrity: Goal: Risk for impaired skin integrity will decrease Outcome: Progressing   Problem: Tissue Perfusion: Goal: Adequacy of tissue perfusion will improve Outcome: Progressing   

## 2023-07-14 NOTE — Inpatient Diabetes Management (Signed)
Inpatient Diabetes Program Recommendations  AACE/ADA: New Consensus Statement on Inpatient Glycemic Control (2015)  Target Ranges:  Prepandial:   less than 140 mg/dL      Peak postprandial:   less than 180 mg/dL (1-2 hours)      Critically ill patients:  140 - 180 mg/dL   Lab Results  Component Value Date   GLUCAP 193 (H) 07/14/2023   HGBA1C 7.0 (H) 07/12/2023    Review of Glycemic Control  Latest Reference Range & Units 07/13/23 07:55 07/13/23 11:00 07/13/23 16:04 07/13/23 21:12 07/14/23 07:16  Glucose-Capillary 70 - 99 mg/dL 563 (H) 875 (H) 643 (H) 182 (H) 193 (H)   Diabetes history: DM 2 Outpatient Diabetes medications: Trulicity 3 mg weekly Current orders for Inpatient glycemic control:  Novolog Sensitive 0-9 units tid + hs  A1c 7% on 8/17 Note: no longer on decadron  Inpatient Diabetes Program Recommendations:    -   Increase Novolog Correction scale to Moderate 0-15 units tid   Thanks,  Christena Deem RN, MSN, BC-ADM Inpatient Diabetes Coordinator Team Pager [phone] (8a-5p)

## 2023-07-15 DIAGNOSIS — G8918 Other acute postprocedural pain: Secondary | ICD-10-CM | POA: Diagnosis not present

## 2023-07-15 DIAGNOSIS — M549 Dorsalgia, unspecified: Secondary | ICD-10-CM | POA: Diagnosis not present

## 2023-07-15 LAB — GLUCOSE, CAPILLARY
Glucose-Capillary: 117 mg/dL — ABNORMAL HIGH (ref 70–99)
Glucose-Capillary: 278 mg/dL — ABNORMAL HIGH (ref 70–99)
Glucose-Capillary: 345 mg/dL — ABNORMAL HIGH (ref 70–99)

## 2023-07-15 LAB — CBC
HCT: 40.1 % (ref 39.0–52.0)
Hemoglobin: 13.8 g/dL (ref 13.0–17.0)
MCH: 31.8 pg (ref 26.0–34.0)
MCHC: 34.4 g/dL (ref 30.0–36.0)
MCV: 92.4 fL (ref 80.0–100.0)
Platelets: 218 K/uL (ref 150–400)
RBC: 4.34 MIL/uL (ref 4.22–5.81)
RDW: 12.5 % (ref 11.5–15.5)
WBC: 14 K/uL — ABNORMAL HIGH (ref 4.0–10.5)
nRBC: 0 % (ref 0.0–0.2)

## 2023-07-15 LAB — BASIC METABOLIC PANEL WITH GFR
Anion gap: 10 (ref 5–15)
BUN: 24 mg/dL — ABNORMAL HIGH (ref 8–23)
CO2: 22 mmol/L (ref 22–32)
Calcium: 9.1 mg/dL (ref 8.9–10.3)
Chloride: 97 mmol/L — ABNORMAL LOW (ref 98–111)
Creatinine, Ser: 0.87 mg/dL (ref 0.61–1.24)
GFR, Estimated: >60 mL/min
Glucose, Bld: 246 mg/dL — ABNORMAL HIGH (ref 70–99)
Potassium: 4.1 mmol/L (ref 3.5–5.1)
Sodium: 129 mmol/L — ABNORMAL LOW (ref 135–145)

## 2023-07-15 LAB — PHOSPHORUS: Phosphorus: 2.5 mg/dL (ref 2.5–4.6)

## 2023-07-15 LAB — MAGNESIUM: Magnesium: 2.2 mg/dL (ref 1.7–2.4)

## 2023-07-15 MED ORDER — POLYETHYLENE GLYCOL 3350 17 G PO PACK
17.0000 g | PACK | Freq: Once | ORAL | Status: AC | PRN
  Administered 2023-07-15: 17 g via ORAL
  Filled 2023-07-15: qty 1

## 2023-07-15 NOTE — Progress Notes (Signed)
CBG 330 at 0726

## 2023-07-15 NOTE — TOC Progression Note (Addendum)
Transition of Care [facility]) - Progression Note    Patient Details  Name: Raymond Melton MRN: [id number] Date of Birth: Oct 03, 1956  Transition of Care [facility]) CM/SW Contact  Lorri Frederick, LCSW Phone Number: 07/15/2023, 12:49 PM  Clinical Narrative:   [facility] does offer.  CSW spoke with pt and he does want to accept this offer.    CSW conferred with MD regarding SNF vs HH.  Due to several additional factors: visually impaired on one side, foot drop, MD does agree with plan for SNF.  Auth request submitted in [place].   Expected Discharge Plan: Skilled Nursing Facility Barriers to Discharge: SNF Pending bed offer, Continued Medical Work up, English as a second language teacher  Expected Discharge Plan and Services In-house Referral: Clinical Social Work     Living arrangements for the past 2 months: Single Family Home                                       Social Determinants of Health (SDOH) Interventions SDOH Screenings   Food Insecurity: Patient Declined (07/11/2023)  Housing: Patient Declined (07/11/2023)  Transportation Needs: Patient Declined (07/11/2023)  Utilities: Not At Risk (07/11/2023)  Alcohol Screen: Low Risk  (12/23/2017)  Recent Concern: Alcohol Screen - Medium Risk (10/03/2017)  Tobacco Use: Medium Risk (07/11/2023)    Readmission Risk Interventions     No data to display

## 2023-07-15 NOTE — Progress Notes (Signed)
Mobility Specialist: Progress Note   07/15/23 1411  Mobility  Activity Ambulated with assistance in hallway  Level of Assistance Contact guard assist, steadying assist  Assistive Device Four wheel walker  Distance Ambulated (ft) 96 ft  Activity Response Tolerated well  Mobility Referral Yes  $Mobility charge 1 Mobility  Mobility Specialist Start Time (ACUTE ONLY) 1355  Mobility Specialist Stop Time (ACUTE ONLY) 1408  Mobility Specialist Time Calculation (min) (ACUTE ONLY) 13 min    Pt was agreeable to mobility session with encouragement - received on couch. Had c/o L knee pain. Ambulation limited d/t 3 occurrences of knee buckling and unsteady gait observed during ambulation - no LOB. Pt returned to couch with all needs met, call bell in reach.   Maurene Capes Mobility Specialist Please contact via SecureChat or Rehab office at [phone]

## 2023-07-15 NOTE — Plan of Care (Signed)
  Problem: Education: Goal: Ability to verbalize activity precautions or restrictions will improve Outcome: Progressing Goal: Knowledge of the prescribed therapeutic regimen will improve Outcome: Progressing Goal: Understanding of discharge needs will improve Outcome: Progressing   Problem: Activity: Goal: Ability to avoid complications of mobility impairment will improve Outcome: Progressing Goal: Ability to tolerate increased activity will improve Outcome: Progressing Goal: Will remain free from falls Outcome: Progressing   Problem: Bowel/Gastric: Goal: Gastrointestinal status for postoperative course will improve Outcome: Progressing   Problem: Clinical Measurements: Goal: Ability to maintain clinical measurements within normal limits will improve Outcome: Progressing Goal: Postoperative complications will be avoided or minimized Outcome: Progressing Goal: Diagnostic test results will improve Outcome: Progressing   Problem: Pain Management: Goal: Pain level will decrease Outcome: Progressing   Problem: Skin Integrity: Goal: Will show signs of wound healing Outcome: Progressing   Problem: Health Behavior/Discharge Planning: Goal: Identification of resources available to assist in meeting health care needs will improve Outcome: Progressing   Problem: Bladder/Genitourinary: Goal: Urinary functional status for postoperative course will improve Outcome: Progressing   Problem: Education: Goal: Knowledge of General Education information will improve Description: Including pain rating scale, medication(s)/side effects and non-pharmacologic comfort measures Outcome: Progressing   Problem: Health Behavior/Discharge Planning: Goal: Ability to manage health-related needs will improve Outcome: Progressing   Problem: Clinical Measurements: Goal: Ability to maintain clinical measurements within normal limits will improve Outcome: Progressing Goal: Will remain free from  infection Outcome: Progressing Goal: Diagnostic test results will improve Outcome: Progressing Goal: Respiratory complications will improve Outcome: Progressing Goal: Cardiovascular complication will be avoided Outcome: Progressing   Problem: Activity: Goal: Risk for activity intolerance will decrease Outcome: Progressing   Problem: Nutrition: Goal: Adequate nutrition will be maintained Outcome: Progressing   Problem: Coping: Goal: Level of anxiety will decrease Outcome: Progressing   Problem: Elimination: Goal: Will not experience complications related to bowel motility Outcome: Progressing Goal: Will not experience complications related to urinary retention Outcome: Progressing   Problem: Pain Managment: Goal: General experience of comfort will improve Outcome: Progressing   Problem: Safety: Goal: Ability to remain free from injury will improve Outcome: Progressing   Problem: Skin Integrity: Goal: Risk for impaired skin integrity will decrease Outcome: Progressing   Problem: Education: Goal: Ability to describe self-care measures that may prevent or decrease complications (Diabetes Survival Skills Education) will improve Outcome: Progressing Goal: Individualized Educational Video(s) Outcome: Progressing   Problem: Coping: Goal: Ability to adjust to condition or change in health will improve Outcome: Progressing   Problem: Fluid Volume: Goal: Ability to maintain a balanced intake and output will improve Outcome: Progressing   Problem: Health Behavior/Discharge Planning: Goal: Ability to identify and utilize available resources and services will improve Outcome: Progressing Goal: Ability to manage health-related needs will improve Outcome: Progressing   Problem: Metabolic: Goal: Ability to maintain appropriate glucose levels will improve Outcome: Progressing   Problem: Nutritional: Goal: Maintenance of adequate nutrition will improve Outcome:  Progressing Goal: Progress toward achieving an optimal weight will improve Outcome: Progressing   Problem: Skin Integrity: Goal: Risk for impaired skin integrity will decrease Outcome: Progressing   Problem: Tissue Perfusion: Goal: Adequacy of tissue perfusion will improve Outcome: Progressing   

## 2023-07-15 NOTE — Progress Notes (Signed)
Physical Therapy Treatment Patient Details Name: Raymond Melton MRN: [id number] DOB: 13-Oct-1956 Today's Date: 07/15/2023   History of Present Illness 67 y.o. male presents to [facility] hospital with worsening symptoms of LLE radiculopathy after recent L4-S1 discectomies. Repeat discectomy on 8/16. PMH includes alcohol abuse, anxiety, OA, depression, DM, GERD, hepatitis C, HTN, TB.    PT Comments  Pt today received ambulating around room with rollator on the phone with wife. Pt very upset about DC planning stating he feels unsafe to go home currently due his house being cluttered and his wife being disabled. Pt feels that he has no support at home and that he is going to fall at home despite being noted to be ambulating around room and hallways independently. Pt educated on current need for rehab as well AFO that his wife ordered for him on [place] however pt not receptive to feedback and education at all. Despite this, No change in DC/DME recs at this time. PT will continue to follow.    If plan is discharge home, recommend the following: A little help with walking and/or transfers;A little help with bathing/dressing/bathroom;Assistance with cooking/housework;Direct supervision/assist for medications management;Assist for transportation;Help with stairs or ramp for entrance   Can travel by private vehicle     Yes  Equipment Recommendations  Other (comment) (Per accepting facility)    Recommendations for Other Services       Precautions / Restrictions Precautions Precautions: Back Precaution Booklet Issued: No Restrictions Weight Bearing Restrictions: No     Mobility  Bed Mobility               General bed mobility comments: Pt received ambulating around room with rollator on the phone    Transfers                   General transfer comment: Pt received ambulating around room with rollator on the phone    Ambulation/Gait Ambulation/Gait assistance: Independent Gait  Distance (Feet): 25 Feet Assistive device: Rollator (4 wheels) Gait Pattern/deviations: Steppage, Decreased stride length, Step-through pattern, Knees buckling, Trunk flexed Gait velocity: Decreased     General Gait Details: Pt with noted L foot drop and high steppage gait to compensate. no LOB noted. Pt receieved walking around room and able to stand for prolonged period of time for conversation with PT.   Stairs             Wheelchair Mobility     Tilt Bed    Modified Rankin (Stroke Patients Only)       Balance Overall balance assessment: Needs assistance Sitting-balance support: Feet supported Sitting balance-Leahy Scale: Normal     Standing balance support: Reliant on assistive device for balance Standing balance-Leahy Scale: Fair Standing balance comment: Reliant on rollator                            Cognition Arousal: Alert Behavior During Therapy: Impulsive Overall Cognitive Status: Within Functional Limits for tasks assessed                                 General Comments: Suspect baseline, however, maximally distracted by his own thoughts with difficulty attenting to therapist cues and commands. Needing constant redirection to listen. Pt seems upset with recover time from spinal surgery and perseverative on how he had plans to ride electric scooter, go to beach with camper, etc. Reviewed  realistic expectations for simply performing self care and walking for 3+ months and have to wait for strengthening, more strenouos exercise until later. Dr. Jake Samples entering session and confirming with pt also providing further education in regard to expectations for progression which can take upwards of 1 year.        Exercises      General Comments General comments (skin integrity, edema, etc.): VSS      Pertinent Vitals/Pain Pain Assessment Pain Assessment: No/denies pain    Home Living                          Prior  Function            PT Goals (current goals can now be found in the care plan section) Progress towards PT goals: Progressing toward goals    Frequency    Min 1X/week      PT Plan      Co-evaluation              AM-PAC PT "6 Clicks" Mobility   Outcome Measure  Help needed turning from your back to your side while in a flat bed without using bedrails?: A Little Help needed moving from lying on your back to sitting on the side of a flat bed without using bedrails?: A Little Help needed moving to and from a bed to a chair (including a wheelchair)?: A Little Help needed standing up from a chair using your arms (e.g., wheelchair or bedside chair)?: A Little Help needed to walk in hospital room?: A Little Help needed climbing 3-5 steps with a railing? : A Lot 6 Click Score: 17    End of Session Equipment Utilized During Treatment: Gait belt Activity Tolerance: Patient tolerated treatment well Patient left: Other (comment) (Continuing to ambulate around room independently.) Nurse Communication: Mobility status PT Visit Diagnosis: Other abnormalities of gait and mobility (R26.89)     Time: 3086-5784 PT Time Calculation (min) (ACUTE ONLY): 21 min  Charges:    $Therapeutic Activity: 8-22 mins PT General Charges $$ ACUTE PT VISIT: 1 Visit                     Shela Nevin, PT, DPT Acute Rehab Services [phone]    Gladys Damme 07/15/2023, 12:48 PM

## 2023-07-15 NOTE — Inpatient Diabetes Management (Signed)
Inpatient Diabetes Program Recommendations  AACE/ADA: New Consensus Statement on Inpatient Glycemic Control (2015)  Target Ranges:  Prepandial:   less than 140 mg/dL      Peak postprandial:   less than 180 mg/dL (1-2 hours)      Critically ill patients:  140 - 180 mg/dL   Lab Results  Component Value Date   GLUCAP 117 (H) 07/15/2023   HGBA1C 7.0 (H) 07/12/2023    Review of Glycemic Control  Latest Reference Range & Units 07/13/23 07:55 07/13/23 11:00 07/13/23 16:04 07/13/23 21:12 07/14/23 07:16  Glucose-Capillary 70 - 99 mg/dL 403 (H) 474 (H) 259 (H) 182 (H) 193 (H)    Latest Reference Range & Units 07/14/23 07:16 07/14/23 19:26 07/15/23 11:12  Glucose-Capillary 70 - 99 mg/dL 563 (H) 875 (H) 643 (H)   Diabetes history: DM 2 Outpatient Diabetes medications: Trulicity 3 mg weekly Current orders for Inpatient glycemic control:  Novolog Sensitive 0-9 units tid + hs  A1c 7% on 8/17 Note: Decadron 4 mg given again this am.  Inpatient Diabetes Program Recommendations:    -   Increase Novolog Correction scale to Moderate 0-15 units tid   Thanks,  Christena Deem RN, MSN, BC-ADM Inpatient Diabetes Coordinator Team Pager [phone] (8a-5p)

## 2023-07-15 NOTE — Progress Notes (Signed)
PROGRESS NOTE    Raymond Melton  JXB:[id number] DOB: 1956-02-05 DOA: 07/11/2023  PCP: Galvin Proffer, MD   Brief Narrative:  This 67 y.o. male with medical history significant of hypertension, hyperlipidemia, CAD s/p stent,, diabetes mellitus type 2, chronic back pain, radiculopathy, history of hepatitis C and remote history of alcohol abuse who presents with acute worsening of his back pain after laminectomy for hernia nucleus pulposus L4-5 L5-S1 left with left L5 and S1 radiculopathies with Dr. Wynetta Emery on 8/7  Prior to the surgery patient reported that he was able to use his left foot, but since the surgery his left foot drags the ground.  PT recommends SNF.  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  Appreciate neurosurgery consultative services,  we will follow-up for any further recommendations. Continue Decadron 4 mg daily. Physical therapy recommended SNF.   Essential hypertension: Continue amlodipine and metoprolol.   Controlled diabetes mellitus type 2, without long-term use of insulin Continue RSSI. Carb modified diet.   CAD: Patient with prior stent placed in 01/2018.   Patient reports Plavix has been on hold for procedure.  Denies any complaints of chest pain. Continue aspirin and statin. Resume Plavix when medically appropriate.   Hyperlipidemia: Continue Crestor.   Bipolar disorder: PTSD: Patient reports being on Xanax/Ativan for anxiety.   PMDP does not show any recent  prescriptions. Start Low-dose Ativan added on as needed for anxiety/tremors   Elevated ALT: Acute.  ALT previously 68 on 8/7 with repeat check today 121.   Patient has a history of hepatitis C -Continue to monitor   History of alcohol abuse: Patient reports being sober for the last 5 years. Encouraged continued cessation of alcohol use.     DVT prophylaxis: Lovenox Code Status: Full code Family Communication: No family at bed side. Disposition Plan:   Status is: Inpatient Remains inpatient appropriate because: Admitted for postoperative back pain.  Neurosurgery is consulted.  PT and OT recommended SNF.    Consultants:  Orthopedics  Procedures: None  Antimicrobials:  Anti-infectives (From admission, onward)    Start     Dose/Rate Route Frequency Ordered Stop   07/11/23 1530  ceFAZolin (ANCEF) IVPB 2g/100 mL premix  Status:  Discontinued        2 g 200 mL/hr over 30 Minutes Intravenous 30 min pre-op 07/11/23 1444 07/11/23 2304       Subjective: Patient was seen and examined at bedside.  Overnight events noted. Patient was sitting on the recliner,  states pain is reasonably controlled. Patient has participated in physical therapy and feels unsteady.  Objective: Vitals:   07/14/23 1520 07/14/23 1925 07/15/23 0337 07/15/23 0726  BP: (!) 149/84 137/79 128/80 (!) 174/87  Pulse: 82 80 78 95  Resp: 20 17 17 20   Temp: 97.6 F (36.4 C) 98.2 F (36.8 C) 97.6 F (36.4 C) 97.6 F (36.4 C)  TempSrc: Oral   Oral  SpO2: 97% 97% 95% 99%  Weight:      Height:  Intake/Output Summary (Last 24 hours) at 07/15/2023 1428 Last data filed at 07/15/2023 0800 Gross per 24 hour  Intake 243 ml  Output --  Net 243 ml   Filed Weights   07/11/23 0646  Weight: 81.6 kg    Examination:  General exam: Appears calm and comfortable, deconditioned, not in any acute distress. Respiratory system: Clear to auscultation. Respiratory effort normal.  RR 14 Cardiovascular system: S1  & S2 heard, RRR. No JVD, murmurs, rubs, gallops or clicks. No pedal edema. Gastrointestinal system: Abdomen is non distended, soft and non tender. Normal bowel sounds heard. Central nervous system: Alert and oriented x 3 . No focal neurological deficits. Extremities: No edema, no cyanosis, no clubbing. Skin: No rashes, lesions or ulcers Psychiatry: Judgement and insight appear normal. Mood & affect appropriate.     Data Reviewed: I have personally reviewed following labs and imaging studies  CBC: Recent Labs  Lab 07/11/23 0711 07/12/23 0114 07/13/23 0458 07/14/23 0346 07/15/23 0129  WBC 7.4 11.7* 10.7* 13.9* 14.0*  NEUTROABS 4.2  --   --   --   --   HGB 15.3 13.6 14.1 14.1 13.8  HCT 43.8 38.5* 40.9 41.1 40.1  MCV 88.7 89.3 90.3 90.7 92.4  PLT 244 201 219 225 218   Basic Metabolic Panel: Recent Labs  Lab 07/11/23 0711 07/12/23 0114 07/13/23 0458 07/14/23 0346 07/15/23 0129  NA 134* 130* 129* 130* 129*  K 4.0 3.9 4.0 4.0 4.1  CL 99 96* 95* 95* 97*  CO2 24 22 23 24 22   GLUCOSE 163* 228* 232* 219* 246*  BUN 21 21 23  27* 24*  CREATININE 0.80 0.75 0.94 0.79 0.87  CALCIUM 9.4 8.5* 9.0 9.0 9.1  MG  --   --   --   --  2.2  PHOS  --   --   --   --  2.5   GFR: Estimated Creatinine Clearance: 91.8 mL/min (by C-G formula based on SCr of 0.87 mg/dL). Liver Function Tests: Recent Labs  Lab 07/11/23 0711  AST 40  ALT 121*  ALKPHOS 114  BILITOT 0.7  PROT 7.0  ALBUMIN 3.8   No results for input(s): "LIPASE", "AMYLASE" in the last 168 hours. No results for input(s): "AMMONIA" in the last 168 hours. Coagulation Profile: No results for input(s): "INR", "PROTIME" in the last 168 hours. Cardiac Enzymes: No results for input(s): "CKTOTAL", "CKMB", "CKMBINDEX", "TROPONINI" in the last 168 hours. BNP (last 3 results) No results for input(s): "PROBNP" in the last 8760 hours. HbA1C: No results for input(s): "HGBA1C" in the last 72 hours.  CBG: Recent Labs  Lab  07/13/23 1604 07/13/23 2112 07/14/23 0716 07/14/23 1926 07/15/23 1112  GLUCAP 309* 182* 193* 266* 117*   Lipid Profile: No results for input(s): "CHOL", "HDL", "LDLCALC", "TRIG", "CHOLHDL", "LDLDIRECT" in the last 72 hours. Thyroid Function Tests: No results for input(s): "TSH", "T4TOTAL", "FREET4", "T3FREE", "THYROIDAB" in the last 72 hours. Anemia Panel: No results for input(s): "VITAMINB12", "FOLATE", "FERRITIN", "TIBC", "IRON", "RETICCTPCT" in the last 72 hours. Sepsis Labs: No results for input(s): "PROCALCITON", "LATICACIDVEN" in the last 168 hours.  No results found for this or any previous visit (from the past 240 hour(s)).   Radiology Studies: No results found.  Scheduled Meds:  amLODipine  10 mg Oral Daily   aspirin EC  81 mg Oral Daily   enoxaparin (LOVENOX) injection  40 mg Subcutaneous Q24H   gabapentin  300 mg Oral TID   insulin aspart  0-5 Units Subcutaneous  QHS   insulin aspart  0-9 Units Subcutaneous TID WC   metoprolol succinate  100 mg Oral Daily   rosuvastatin  20 mg Oral Daily   sodium chloride flush  3 mL Intravenous Q12H   Continuous Infusions:   LOS: 4 days    Time spent: 35 Mins    Willeen Niece, MD [facility]   If 7PM-7AM, please contact night-coverage

## 2023-07-15 NOTE — Progress Notes (Addendum)
Patient verbalized that he prefers the dilaudid for pain over the vicodin when he requests for pain med. Patient noted up walking in hallway with walker independently. Safety precautions reviewed and education provided on pain medication side effects. Will continue to monitor.

## 2023-07-16 DIAGNOSIS — M549 Dorsalgia, unspecified: Secondary | ICD-10-CM | POA: Diagnosis not present

## 2023-07-16 DIAGNOSIS — G8918 Other acute postprocedural pain: Secondary | ICD-10-CM | POA: Diagnosis not present

## 2023-07-16 LAB — CBC
HCT: 43.1 % (ref 39.0–52.0)
Hemoglobin: 14.7 g/dL (ref 13.0–17.0)
MCH: 30.8 pg (ref 26.0–34.0)
MCHC: 34.1 g/dL (ref 30.0–36.0)
MCV: 90.2 fL (ref 80.0–100.0)
Platelets: 239 10*3/uL (ref 150–400)
RBC: 4.78 MIL/uL (ref 4.22–5.81)
RDW: 12.4 % (ref 11.5–15.5)
WBC: 13.4 10*3/uL — ABNORMAL HIGH (ref 4.0–10.5)
nRBC: 0 % (ref 0.0–0.2)

## 2023-07-16 LAB — BASIC METABOLIC PANEL
Anion gap: 11 (ref 5–15)
BUN: 20 mg/dL (ref 8–23)
CO2: 23 mmol/L (ref 22–32)
Calcium: 9.5 mg/dL (ref 8.9–10.3)
Chloride: 98 mmol/L (ref 98–111)
Creatinine, Ser: 1.05 mg/dL (ref 0.61–1.24)
GFR, Estimated: >60 mL/min (ref >60)
Glucose, Bld: 180 mg/dL — ABNORMAL HIGH (ref 70–99)
Potassium: 3.9 mmol/L (ref 3.5–5.1)
Sodium: 132 mmol/L — ABNORMAL LOW (ref 135–145)

## 2023-07-16 LAB — GLUCOSE, CAPILLARY
Glucose-Capillary: 207 mg/dL — ABNORMAL HIGH (ref 70–99)
Glucose-Capillary: 221 mg/dL — ABNORMAL HIGH (ref 70–99)
Glucose-Capillary: 215 mg/dL — ABNORMAL HIGH (ref 70–99)
Glucose-Capillary: 198 mg/dL — ABNORMAL HIGH (ref 70–99)

## 2023-07-16 LAB — MAGNESIUM: Magnesium: 2.3 mg/dL (ref 1.7–2.4)

## 2023-07-16 LAB — PHOSPHORUS: Phosphorus: 2.9 mg/dL (ref 2.5–4.6)

## 2023-07-16 NOTE — Plan of Care (Signed)
  Problem: Education: Goal: Ability to verbalize activity precautions or restrictions will improve Outcome: Progressing Goal: Knowledge of the prescribed therapeutic regimen will improve Outcome: Progressing Goal: Understanding of discharge needs will improve Outcome: Progressing   Problem: Activity: Goal: Ability to avoid complications of mobility impairment will improve Outcome: Progressing Goal: Ability to tolerate increased activity will improve Outcome: Progressing Goal: Will remain free from falls Outcome: Progressing   Problem: Bowel/Gastric: Goal: Gastrointestinal status for postoperative course will improve Outcome: Progressing   Problem: Clinical Measurements: Goal: Ability to maintain clinical measurements within normal limits will improve Outcome: Progressing Goal: Postoperative complications will be avoided or minimized Outcome: Progressing Goal: Diagnostic test results will improve Outcome: Progressing   Problem: Pain Management: Goal: Pain level will decrease Outcome: Progressing   Problem: Skin Integrity: Goal: Will show signs of wound healing Outcome: Progressing   Problem: Health Behavior/Discharge Planning: Goal: Identification of resources available to assist in meeting health care needs will improve Outcome: Progressing   Problem: Bladder/Genitourinary: Goal: Urinary functional status for postoperative course will improve Outcome: Progressing   Problem: Education: Goal: Knowledge of General Education information will improve Description: Including pain rating scale, medication(s)/side effects and non-pharmacologic comfort measures Outcome: Progressing   Problem: Health Behavior/Discharge Planning: Goal: Ability to manage health-related needs will improve Outcome: Progressing   Problem: Clinical Measurements: Goal: Ability to maintain clinical measurements within normal limits will improve Outcome: Progressing Goal: Will remain free from  infection Outcome: Progressing Goal: Diagnostic test results will improve Outcome: Progressing Goal: Respiratory complications will improve Outcome: Progressing Goal: Cardiovascular complication will be avoided Outcome: Progressing   Problem: Activity: Goal: Risk for activity intolerance will decrease Outcome: Progressing   Problem: Nutrition: Goal: Adequate nutrition will be maintained Outcome: Progressing   Problem: Coping: Goal: Level of anxiety will decrease Outcome: Progressing   Problem: Elimination: Goal: Will not experience complications related to bowel motility Outcome: Progressing Goal: Will not experience complications related to urinary retention Outcome: Progressing   Problem: Pain Managment: Goal: General experience of comfort will improve Outcome: Progressing   Problem: Safety: Goal: Ability to remain free from injury will improve Outcome: Progressing   Problem: Skin Integrity: Goal: Risk for impaired skin integrity will decrease Outcome: Progressing   Problem: Education: Goal: Ability to describe self-care measures that may prevent or decrease complications (Diabetes Survival Skills Education) will improve Outcome: Progressing Goal: Individualized Educational Video(s) Outcome: Progressing   Problem: Coping: Goal: Ability to adjust to condition or change in health will improve Outcome: Progressing   Problem: Fluid Volume: Goal: Ability to maintain a balanced intake and output will improve Outcome: Progressing   Problem: Health Behavior/Discharge Planning: Goal: Ability to identify and utilize available resources and services will improve Outcome: Progressing Goal: Ability to manage health-related needs will improve Outcome: Progressing   Problem: Metabolic: Goal: Ability to maintain appropriate glucose levels will improve Outcome: Progressing   Problem: Nutritional: Goal: Maintenance of adequate nutrition will improve Outcome:  Progressing Goal: Progress toward achieving an optimal weight will improve Outcome: Progressing   Problem: Skin Integrity: Goal: Risk for impaired skin integrity will decrease Outcome: Progressing   Problem: Tissue Perfusion: Goal: Adequacy of tissue perfusion will improve Outcome: Progressing   

## 2023-07-16 NOTE — TOC Progression Note (Addendum)
Transition of Care [facility]) - Progression Note    Patient Details  Name: Raymond Melton MRN: [id number] Date of Birth: 03-Jan-1956  [facility]) CM/SW Contact  Lorri Frederick, LCSW Phone Number: 07/16/2023, 8:33 AM  Clinical Narrative:   [facility] request remains pending in [employer].    1400: auth remains pending.    Expected Discharge Plan: Skilled Nursing Facility Barriers to Discharge: SNF Pending bed offer, Continued Medical Work up, English as a second language teacher  Expected Discharge Plan and Services In-house Referral: Clinical Social Work     Living arrangements for the past 2 months: Single Family Home                                       Social Determinants of Health (SDOH) Interventions SDOH Screenings   Food Insecurity: Patient Declined (07/11/2023)  Housing: Patient Declined (07/11/2023)  Transportation Needs: Patient Declined (07/11/2023)  Utilities: Not At Risk (07/11/2023)  Alcohol Screen: Low Risk  (12/23/2017)  Recent Concern: Alcohol Screen - Medium Risk (10/03/2017)  Tobacco Use: Medium Risk (07/11/2023)    Readmission Risk Interventions     No data to display

## 2023-07-16 NOTE — Progress Notes (Addendum)
PROGRESS NOTE    Treavon Salters Calix  WUJ:[id number] DOB: 1955-12-05 DOA: 07/11/2023  PCP: Galvin Proffer, MD   Brief Narrative:  This 67 y.o. male with medical history significant of hypertension, hyperlipidemia, CAD s/p stent,, diabetes mellitus type 2, chronic back pain, radiculopathy, history of hepatitis C and remote history of alcohol abuse who presents with acute worsening of his back pain after laminectomy for hernia nucleus pulposus L4-5 L5-S1 left with left L5 and S1 radiculopathies with Dr. Wynetta Emery on 8/7  Prior to the surgery patient reported that he was able to use his left foot, but since the surgery his left foot drags the ground.  PT recommends SNF.  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  Appreciate neurosurgery consultative services,  we will follow-up for any further recommendations. Continue Decadron 4 mg daily. Physical therapy recommended SNF - awaiting safe dispo.   Essential hypertension: Continue amlodipine and metoprolol.   Controlled diabetes mellitus type 2, without long-term use of insulin Continue RSSI. Carb modified diet.   CAD: Patient with prior stent placed in 01/2018.   Patient reports Plavix has been on hold for procedure.  Denies any complaints of chest pain. Continue aspirin and statin. Resume Plavix when medically appropriate.   Hyperlipidemia: Continue Crestor.   Bipolar disorder: PTSD: Patient reports being on Xanax/Ativan for anxiety.   PMDP does not show any  recent prescriptions - will discontinue this medication as it seems to have been weaned off previously If patient becomes symptomatic can consider librium taper - will need to be evaluated by outpatient PCP/Psych for continued medications given this is not an ideal medication for PTSD/Bipolar especially at his age. Consider further discussion in regards to SSRI/Carbamazapine(or lithium) depending on his symptoms. Currently 'well controlled' per patient.   Elevated ALT: Acute.  ALT previously 68 on 8/7 with repeat check today 121.   Patient has a history of hepatitis C -Continue to monitor   History of alcohol abuse: Patient reports being sober for the last 5 years. Encouraged continued cessation of alcohol use.   DVT prophylaxis: Lovenox Code Status: Full code Family Communication: No family at bed side. Disposition Plan:   Status is: Inpatient Remains inpatient appropriate because: Admitted for postoperative back pain.  Neurosurgery is consulted.  PT and OT recommended SNF.  Consultants:  Orthopedics  Procedures: None  Antimicrobials:  Anti-infectives (From admission, onward)    Start     Dose/Rate Route Frequency Ordered Stop   07/11/23 1530  ceFAZolin (ANCEF) IVPB 2g/100 mL premix  Status:  Discontinued        2 g 200 mL/hr over 30 Minutes Intravenous 30 min pre-op 07/11/23 1444 07/11/23 2304       Subjective: Patient was seen and examined at bedside.  No acute issues/events - patient requesting increased/more frequent narcotics which we indicated was not appropriate, if anything we would be weaning medications today. Also requests resumption of benzos (that he is not currently on per med history) - discussed alternatives for PTSD/Bipolar disorder.  Objective: Vitals:  07/15/23 1439 07/15/23 1900 07/15/23 1934 07/16/23 0406  BP: 118/73  133/65 138/77  Pulse: 82  78 75  Resp: 18     Temp: 97.7 F (36.5 C) 98 F (36.7 C)  (!) 97.4 F (36.3 C)  TempSrc: Oral Oral   Oral  SpO2: 99%  98% 100%  Weight:      Height:        Intake/Output Summary (Last 24 hours) at 07/16/2023 0753 Last data filed at 07/15/2023 2203 Gross per 24 hour  Intake 243 ml  Output --  Net 243 ml   Filed Weights   07/11/23 0646  Weight: 81.6 kg    Examination:  General exam: Appears calm and comfortable, deconditioned, not in any acute distress. Respiratory system: Clear to auscultation. Respiratory effort normal.  RR 14 Cardiovascular system: S1 & S2 heard, RRR. No JVD, murmurs, rubs, gallops or clicks. No pedal edema. Gastrointestinal system: Abdomen is non distended, soft and non tender. Normal bowel sounds heard. Central nervous system: Alert and oriented x 3 . No focal neurological deficits. Extremities: No edema, no cyanosis, no clubbing. Skin: No rashes, lesions or ulcers Psychiatry: Agitated, pressured speech.     Data Reviewed: I have personally reviewed following labs and imaging studies  CBC: Recent Labs  Lab 07/11/23 0711 07/12/23 0114 07/13/23 0458 07/14/23 0346 07/15/23 0129 07/16/23 0429  WBC 7.4 11.7* 10.7* 13.9* 14.0* 13.4*  NEUTROABS 4.2  --   --   --   --   --   HGB 15.3 13.6 14.1 14.1 13.8 14.7  HCT 43.8 38.5* 40.9 41.1 40.1 43.1  MCV 88.7 89.3 90.3 90.7 92.4 90.2  PLT 244 201 219 225 218 239   Basic Metabolic Panel: Recent Labs  Lab 07/12/23 0114 07/13/23 0458 07/14/23 0346 07/15/23 0129 07/16/23 0429  NA 130* 129* 130* 129* 132*  K 3.9 4.0 4.0 4.1 3.9  CL 96* 95* 95* 97* 98  CO2 22 23 24 22 23   GLUCOSE 228* 232* 219* 246* 180*  BUN 21 23 27* 24* 20  CREATININE 0.75 0.94 0.79 0.87 1.05  CALCIUM 8.5* 9.0 9.0 9.1 9.5  MG  --   --   --  2.2 2.3  PHOS  --   --   --  2.5 2.9   GFR: Estimated Creatinine Clearance: 76.1 mL/min (by C-G formula based on SCr of 1.05 mg/dL).  Radiology Studies: No results found.  Scheduled Meds:  amLODipine  10 mg Oral Daily   aspirin EC  81 mg Oral Daily   enoxaparin (LOVENOX) injection  40  mg Subcutaneous Q24H   gabapentin  300 mg Oral TID   insulin aspart  0-5 Units Subcutaneous QHS   insulin aspart  0-9 Units Subcutaneous TID WC   metoprolol succinate  100 mg Oral Daily   rosuvastatin  20 mg Oral Daily   sodium chloride flush  3 mL Intravenous Q12H   Continuous Infusions:   LOS: 5 days   Time spent: 35 Mins  Azucena Fallen, DO [facility]   If 7PM-7AM, please contact night-coverage

## 2023-07-16 NOTE — Progress Notes (Signed)
Occupational Therapy Treatment Patient Details Name: Raymond Melton MRN: [id number] DOB: [birth year] Today's Date: 07/16/2023   History of present illness 67 y.o. male presents to [facility] hospital with worsening symptoms of LLE radiculopathy after recent L4-S1 discectomies. Repeat discectomy on 8/16. PMH includes alcohol abuse, anxiety, OA, depression, DM, GERD, hepatitis C, HTN, TB.   OT comments  Pt initially requested to work with therapy in the afternoon although mentioned that his AFO arrived and he wasn't sure how it was supposed to go on because he had two different photos. Session focused on AFO education including donning/doffing method, wearing schedule, position of AFO in shoe, and recommended type of socks to wear. Velcro applied to back of brace to maintain contact of lower strap. Pt educated on compensatory technique to complete LB dressing while seated and maintaining back precautions. Pt was able to demonstrate understanding. Provided gait belt and educated pt on utilizing it as a leg lifter. Encouraged pt to complete figure 4 stretch while seated or supine to help gently stretch lower back.       If plan is discharge home, recommend the following:  Assist for transportation;Assistance with cooking/housework;A little help with walking and/or transfers;A little help with bathing/dressing/bathroom;Help with stairs or ramp for entrance   Equipment Recommendations  None recommended by OT       Precautions / Restrictions Precautions Precautions: Back Precaution Booklet Issued: No Precaution Comments: Provided verbal education throughout session regarding back precautions. Restrictions Weight Bearing Restrictions: No       Mobility Bed Mobility Overal bed mobility: Needs Assistance Bed Mobility: Supine to Sit, Sit to Supine     Supine to sit: Supervision, HOB elevated, Used rails Sit to supine: Supervision, Used rails   General bed mobility comments: One VC initially to  log roll when completing bed mobility.    Transfers Overall transfer level: Needs assistance Equipment used: Rollator (4 wheels) Transfers: Sit to/from Stand, Bed to chair/wheelchair/BSC Sit to Stand: Supervision     Step pivot transfers: Supervision     General transfer comment: Overall, pt was able to manage Rolator safely. VC provided to keep brakes on while completing sit to stand.     Balance Overall balance assessment: Needs assistance Sitting-balance support: Feet supported, No upper extremity supported Sitting balance-Leahy Scale: Normal Sitting balance - Comments: sitting EOB completing LB dressing.   Standing balance support: Reliant on assistive device for balance, Bilateral upper extremity supported, During functional activity Standing balance-Leahy Scale: Fair Standing balance comment: Reliant on rollator          ADL either performed or assessed with clinical judgement   ADL Overall ADL's : Needs assistance/impaired    Lower Body Dressing: Supervision/safety;Sit to/from stand;Cueing for back precautions;Cueing for compensatory techniques Lower Body Dressing Details (indicate cue type and reason): Pt requested assist with AFO brace and how it is to be worn. OT applied additional velcro to secure lower strap. Gait belt provided and pt educated on using it as leg lifter to fully bring LLE across right knee to complete LB dressing while maintaining back precautions. Provided pt with VC and visual demonstration. Pt demonstrated understanding requiring occassional cues for correction of technique. While seated using a figure 4 position with each leg, pt was able to complete LB dressing including socks, shoes, and AFO management.     Toileting- Clothing Manipulation and Hygiene: Modified independent;Adhering to back precautions Toileting - Clothing Manipulation Details (indicate cue type and reason): completed standing     Functional mobility during  ADLs:  Supervision/safety;Rollator (4 wheels)        Cognition Arousal: Alert Behavior During Therapy: WFL for tasks assessed/performed (slightly impulsive) Overall Cognitive Status: Within Functional Limits for tasks assessed       General Comments: Slight difficulty with comprehension of education and required it to be repeated or explained in a different way to retain. Wrote down specific instructions for wearing AFO brace on paper for reference.              General Comments VSS on RA. back incision closed and swollen.    Pertinent Vitals/ Pain       Pain Assessment Pain Assessment: Faces Faces Pain Scale: Hurts even more Pain Location: lower back Pain Descriptors / Indicators: Aching, Sore Pain Intervention(s): Monitored during session, Limited activity within patient's tolerance         Frequency  Min 1X/week        Progress Toward Goals  OT Goals(current goals can now be found in the care plan section)  Progress towards OT goals: Progressing toward goals            AM-PAC OT "6 Clicks" Daily Activity     Outcome Measure   Help from another person eating meals?: None Help from another person taking care of personal grooming?: None Help from another person toileting, which includes using toliet, bedpan, or urinal?: None Help from another person bathing (including washing, rinsing, drying)?: A Little Help from another person to put on and taking off regular upper body clothing?: None Help from another person to put on and taking off regular lower body clothing?: A Little 6 Click Score: 22    End of Session Equipment Utilized During Treatment: Rollator (4 wheels)  OT Visit Diagnosis: Unsteadiness on feet (R26.81);Pain Pain - part of body:  (back)   Activity Tolerance Patient tolerated treatment well   Patient Left in bed;with call bell/phone within reach   Nurse Communication Mobility status        Time: 1100-1200 OT Time Calculation (min): 60  min  Charges: OT General Charges $OT Visit: 1 Visit OT Treatments $Self Care/Home Management : 53-67 mins  Limmie Patricia, OTR/L,CBIS  Supplemental OT - [facility] and [facility] Secure Chat Preferred    Ayden Hardwick, Charisse March 07/16/2023, 3:50 PM

## 2023-07-16 NOTE — Progress Notes (Signed)
Mobility Specialist: Progress Note   07/16/23 1053  Mobility  Activity Ambulated with assistance in hallway  Level of Assistance Contact guard assist, steadying assist  Assistive Device Four wheel walker  Distance Ambulated (ft) 150 ft  Activity Response Tolerated well  Mobility Referral Yes  $Mobility charge 1 Mobility  Mobility Specialist Start Time (ACUTE ONLY) [id number]  Mobility Specialist Stop Time (ACUTE ONLY) 1027  Mobility Specialist Time Calculation (min) (ACUTE ONLY) 34 min    Pt was agreeable to mobility session with encouragement - received in bed. Had c/o of LLE and back pain. Also c/o discomfort from L foot drop splint and blisters forming. No occurrences of knee buckling or instability. Took 2x standing break (~10 secs). Denies SOB or dizziness. Returned to room without fault. Left on EOB with all needs met, call bell in reach.   Maurene Capes Mobility Specialist Please contact via SecureChat or Rehab office at [phone]

## 2023-07-16 NOTE — Progress Notes (Signed)
Patient was very agitated and upset at beginning of the shift. It was reported he was very upset with some changes made to his medication  by the provider. Patient was told he would have a pysch evaluation done this evening and it did not happen he was very upset about that. So that sent patient into a spiral of emotions. Patient was calling other mental health facilities to see if they had openings. When patient was told he could not be transferred he started saying "they do not know how critical my mental health is". Nurse asked patient all suicidal protocol questions but patient was sarcotic with response. Stated his lawyer told him what to say and response to questions. Nurse informed on call provider of patient current state. On call provider was able to talk wit patient and address his concerns. Patient resting quietly in room.

## 2023-07-16 NOTE — Inpatient Diabetes Management (Signed)
Inpatient Diabetes Program Recommendations  AACE/ADA: New Consensus Statement on Inpatient Glycemic Control (2015)  Target Ranges:  Prepandial:   less than 140 mg/dL      Peak postprandial:   less than 180 mg/dL (1-2 hours)      Critically ill patients:  140 - 180 mg/dL   Lab Results  Component Value Date   GLUCAP 221 (H) 07/16/2023   HGBA1C 7.0 (H) 07/12/2023    Review of Glycemic Control  Latest Reference Range & Units 07/15/23 11:12 07/15/23 16:03 07/15/23 21:11 07/16/23 08:21 07/16/23 12:05  Glucose-Capillary 70 - 99 mg/dL 956 (H) 213 (H) 086 (H) 207 (H) 221 (H)   Diabetes history: DM 2 Outpatient Diabetes medications: Trulicity 3 mg weekly Current orders for Inpatient glycemic control:  Novolog Sensitive 0-9 units tid + hs  A1c 7% on 8/17 Note: Decadron 4 mg given again this am.  Inpatient Diabetes Program Recommendations:    -   Add Novolog 4 units tid meal coverage if eating >50% of meals  Thanks,  Christena Deem RN, MSN, BC-ADM Inpatient Diabetes Coordinator Team Pager [phone] (8a-5p)

## 2023-07-17 ENCOUNTER — Other Ambulatory Visit (HOSPITAL_COMMUNITY): Payer: Self-pay

## 2023-07-17 DIAGNOSIS — F3162 Bipolar disorder, current episode mixed, moderate: Secondary | ICD-10-CM

## 2023-07-17 DIAGNOSIS — F411 Generalized anxiety disorder: Secondary | ICD-10-CM | POA: Insufficient documentation

## 2023-07-17 DIAGNOSIS — M549 Dorsalgia, unspecified: Secondary | ICD-10-CM | POA: Diagnosis not present

## 2023-07-17 DIAGNOSIS — F419 Anxiety disorder, unspecified: Secondary | ICD-10-CM | POA: Insufficient documentation

## 2023-07-17 DIAGNOSIS — G8918 Other acute postprocedural pain: Secondary | ICD-10-CM | POA: Diagnosis not present

## 2023-07-17 MED ORDER — IBUPROFEN 400 MG PO TABS: 400.0000 mg | ORAL_TABLET | ORAL | 0 refills | Status: AC | PRN

## 2023-07-17 MED ORDER — POLYETHYLENE GLYCOL 3350 17 G PO PACK
17.0000 g | PACK | Freq: Every day | ORAL | Status: DC | PRN
  Administered 2023-07-17: 17 g via ORAL
  Filled 2023-07-17 (×2): qty 1

## 2023-07-17 MED ORDER — ONDANSETRON HCL 4 MG PO TABS: 4.0000 mg | ORAL_TABLET | Freq: Four times a day (QID) | ORAL | 0 refills | Status: AC | PRN

## 2023-07-17 MED ORDER — HYDROCODONE-ACETAMINOPHEN 10-325 MG PO TABS: 1.0000 | ORAL_TABLET | Freq: Four times a day (QID) | ORAL | 0 refills | Status: DC | PRN

## 2023-07-17 MED ORDER — HYDROCODONE-ACETAMINOPHEN 10-325 MG PO TABS
1.0000 | ORAL_TABLET | Freq: Four times a day (QID) | ORAL | 0 refills | Status: AC | PRN
  Filled 2023-07-17: qty 12, 3d supply, fill #0

## 2023-07-17 NOTE — Progress Notes (Addendum)
    Patient Name: Raymond Melton           DOB: Apr 30, 1956  MRN: [id number]      Admission Date: 07/11/2023  Attending Provider: Azucena Fallen, MD  Primary Diagnosis: Postoperative back pain   Level of care: Med-Surg    CROSS COVER NOTE   Date of Service   07/17/2023   [name] [facility], 67 y.o. male, was admitted on 07/11/2023 for Postoperative back pain.    HPI/Events of Note   Suicidal ideation Patient initially tells RN he plans to hurt himself. When I spoke to patient over the phone, he is reluctant to answer any further questions regarding statement above.  Not at flight risk at this time.   Interventions/ Plan   SI safety sitter Day team to follow-up on psych evaluation if needed        Anthoney Harada, DNP, [employer] [facility] [place]

## 2023-07-17 NOTE — TOC Transition Note (Signed)
Transition of Care [facility]) - CM/SW Discharge Note   Patient Details  Name: Raymond Melton MRN: [id number] Date of Birth: [birth year]  Transition of Care [facility]) CM/SW Contact:  Epifanio Lesches, RN Phone Number: 07/17/2023, 11:52 AM   Clinical Narrative:    Patient will DC to: home Anticipated DC date: 07/17/2023 Family notified: yes Transport by: car   Per MD patient ready for DC today. RN, patient,  and patient's wife notified of DC. Pt with SNF denial. Pt agreeable to home health services. Pt without provider preference. Referral made with [facility] and accepted pending MD's order. Wife to assist with care once d/c. Pt with RW for home @ bedside. Pt without RX med concerns. Post hospital f/u noted on AVS. Wife to provide transportation to home. RNCM will sign off for now as intervention is no longer needed. Please consult [place] again if new needs arise.   Final next level of care: Home w Home Health Services Barriers to Discharge: No Barriers Identified   Patient Goals and CMS Choice   Choice offered to / list presented to : Patient  Discharge Placement                         Discharge Plan and Services Additional resources added to the After Visit Summary for   In-house Referral: Clinical Social Work                        HH Arranged: PT, OT [facility] Agency: [facility] Date [facility] Agency Contacted: 07/17/23 Time HH Agency Contacted: 1141 Representative spoke with at [facility] Agency: Tresa Endo  Social Determinants of Health (SDOH) Interventions SDOH Screenings   Food Insecurity: Patient Declined (07/11/2023)  Housing: Patient Declined (07/11/2023)  Transportation Needs: Patient Declined (07/11/2023)  Utilities: Not At Risk (07/11/2023)  Alcohol Screen: Low Risk  (12/23/2017)  Recent Concern: Alcohol Screen - Medium Risk (10/03/2017)  Tobacco Use: Medium Risk (07/11/2023)     Readmission Risk Interventions     No data to display
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
## 2023-07-17 NOTE — Plan of Care (Signed)
Pt D/C to home to self. Pt prescription printed and given to him. Pt IV removed, belongings returned, AVS reviewed questions answered. No other follow up needed at this time. BP (!) 125/96 (BP Location: Left Arm)   Pulse 85   Temp 97.7 F (36.5 C) (Oral)   Resp 20   Ht 6\' 1"  (1.854 m)   Wt 81.6 kg   SpO2 96%   BMI 23.75 kg/m  Raymond Melton 07/17/23 11:13 AM

## 2023-07-17 NOTE — Progress Notes (Signed)
Pt is being aggressive and disrespectful to sitter Rn aware

## 2023-07-17 NOTE — Discharge Summary (Signed)
Physician Discharge Summary  Raymond Melton Haren UEA:[id number] DOB: 1956/05/03 DOA: 07/11/2023  PCP: Galvin Proffer, MD  Admit date: 07/11/2023 Discharge date: 07/17/2023  Admitted From: Home Disposition: Home  Recommendations for Outpatient Follow-up:  Follow up with PCP in 1-2 weeks Close follow-up with psychiatry as discussed:  Home Health: PT OT Equipment/Devices: No new equipment  Discharge Condition: Stable CODE STATUS: Full Diet recommendation: Low-salt low-fat low-carb diet  Brief/Interim Summary: This 67 y.o. male with medical history significant of hypertension, hyperlipidemia, CAD s/p stent,, diabetes mellitus type 2, chronic back pain, radiculopathy, history of hepatitis C and remote history of alcohol abuse who presents with acute worsening of his back pain after laminectomy for hernia nucleus pulposus L4-5 L5-S1 left with left L5 and S1 radiculopathies with Dr. Wynetta Emery on 8/7  Prior to the surgery patient reported that he was able to use his left foot, but since the surgery his left foot drags the ground.  PT initially recommending SNF placement due to profound ambulatory dysfunction.  Over the past 72 hours after receiving his ankle-foot orthosis (AFO) due to ongoing persistent foot drop with marked improvement in his ambulation.  Given patient's improvement he is otherwise stable and agreeable for discharge home with ongoing physical therapy at home.  Unfortunately during patient's hospitalization there was confusion and misunderstanding about his outpatient home medications.  Per PMP aware he has not been on benzodiazepines for more than 2 years, patient indicates he still takes benzos twice daily as needed.  Due to his report this medication was initiated at intake but given it has been verified to be discontinued the medication was stopped.  Patient became markedly agitated during this event and overnight became aggressive with staff and threatened self-harm.  Psychiatry was  consulted, patient's threat was hyperbole and he was just frustrated.  Psychiatry did attempt to discuss starting new medications with patient, as did hospitalist team, with marked resistance.  We discussed a mood stabilizer would likely improve his day-to-day anxiety but patient continued to request benzos.  At this time he is otherwise stable and agreeable for discharge, he will not be discharged on any benzodiazepines, patient will receive 3 days of pain medication to cover previous back procedure until he can follow-up with orthopedic surgery.    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  Improving with orthotic   Essential hypertension: Continue amlodipine and metoprolol.   Controlled diabetes mellitus type 2, without long-term use of insulin Resume home regimen and diet A1C 7.0   CAD: Patient with prior stent placed in 01/2018.   Continue aspirin plavix and statin.  Hyperlipidemia: Continue Crestor.   Bipolar disorder: PTSD: Patient reports being on Xanax/Ativan for anxiety.   PMDP does not show any recent prescriptions for more than 2 years Consider further discussion in regards to SSRI/Carbamazapine(or lithium) depending on his symptoms. Currently 'well controlled' per patient -he requested evaluation by psych but then refused any medication recommendations from their team.  Requesting for benzodiazepines specifically.   Elevated ALT: Acute -follow labs with PCP History of hepatitis  C   History of  alcohol abuse: Patient reports being sober for the last 5 years. Encouraged continued cessation of alcohol use.  Discharge Instructions   Allergies as of 07/17/2023       Reactions   Amipaque [metrizamide] Anaphylaxis, Swelling   Iodinated Contrast Media Anaphylaxis, Swelling   Iodine Anaphylaxis   Iohexol Anaphylaxis, Swelling   Ioxaglate Anaphylaxis, Swelling   Other Anaphylaxis, Shortness Of Breath, Swelling   IV dye   Codeine Itching, Other (See Comments)   Restless    Ambien [zolpidem] Other (See Comments)   Confusion    Diovan [valsartan] Other (See Comments)   Unknown reaction   Glucophage [metformin] Diarrhea   Latex Other (See Comments)   Unknown reaction   Reglan [metoclopramide] Other (See Comments)   Unknown reaction   Remeron [mirtazapine] Other (See Comments)   Unknown reaction   Tuberculin Tests Other (See Comments)   Severe skin reaction   Tuberculin, Ppd Other (See Comments)   Severe skin reaction   Zestril [lisinopril] Other (See Comments)   Unknown reaction        Medication List     STOP taking these medications    ATIVAN PO   cyclobenzaprine 10 MG tablet Commonly known as: FLEXERIL   methocarbamol 500 MG tablet Commonly known as: ROBAXIN       TAKE these medications    amLODipine 10 MG tablet Commonly known as: NORVASC Take 10 mg by mouth daily.   aspirin EC 81 MG tablet Take 81 mg by mouth daily. Swallow whole.   clopidogrel 75 MG tablet Commonly known as: PLAVIX Take 75 mg by mouth daily.   gabapentin 300 MG capsule Commonly known as: NEURONTIN Take 300 mg by mouth every evening.   HYDROcodone-acetaminophen 10-325 MG tablet Commonly known as: NORCO Take 1 tablet by mouth every 6 (six) hours as needed for up to 3 days for severe pain.   ibuprofen 400 MG tablet Commonly known as: ADVIL Take 1 tablet (400 mg total) by mouth every 4 (four) hours as needed for mild pain, moderate pain, fever or headache. What changed:   how much to take when to take this reasons to take this   linaclotide 72 MCG capsule Commonly known as: LINZESS Take 72 mcg by mouth daily as needed (no BM).   Melatonin 10 MG Tabs Take 10 mg by mouth at bedtime as needed (sleep).   methylPREDNISolone 4 MG Tbpk tablet Commonly known as: MEDROL DOSEPAK Take as directed   metoprolol succinate 100 MG 24 hr tablet Commonly known as: TOPROL-XL Take 1 tablet (100 mg total) by mouth daily. Take with or immediately following a meal: For high blood pressure   nitroGLYCERIN 0.4 MG SL tablet Commonly known as: NITROSTAT Place 1 tablet (0.4 mg total) under the tongue every 5 (five) minutes x 3 doses as needed for chest pain.   ondansetron 4 MG tablet Commonly known as: ZOFRAN Take 1 tablet (4 mg total) by mouth every 6 (six) hours as needed for nausea.   rosuvastatin 20 MG tablet Commonly known as: CRESTOR Take 1 tablet (20 mg total) by mouth daily.   tiZANidine 4 MG tablet Commonly known as: ZANAFLEX Take 4 mg by mouth 3 (three) times daily as needed for muscle spasms.   traMADol 50 MG tablet Commonly known as: ULTRAM Take 50 mg by mouth every 6 (six) hours as needed for moderate pain.   Trulicity 3 MG/0.5ML Sopn Generic drug: Dulaglutide Inject 3 mg into the skin  every Thursday.        Contact information for after-discharge care     Destination     [facility] .   Service: Skilled Nursing Contact information: [address] [place] [address] [phone]                    Allergies  Allergen Reactions   Amipaque [Metrizamide] Anaphylaxis and Swelling   Iodinated Contrast Media Anaphylaxis and Swelling   Iodine Anaphylaxis   Iohexol Anaphylaxis and Swelling   Ioxaglate Anaphylaxis and Swelling   Other Anaphylaxis, Shortness Of Breath and Swelling    IV dye   Codeine Itching and Other (See Comments)    Restless    Ambien  [Zolpidem] Other (See Comments)    Confusion    Diovan [Valsartan] Other (See Comments)    Unknown reaction   Glucophage [Metformin] Diarrhea   Latex Other (See Comments)    Unknown reaction   Reglan [Metoclopramide] Other (See Comments)    Unknown reaction   Remeron [Mirtazapine] Other (See Comments)    Unknown reaction   Tuberculin Tests Other (See Comments)    Severe skin reaction   Tuberculin, Ppd Other (See Comments)    Severe skin reaction   Zestril [Lisinopril] Other (See Comments)    Unknown reaction    Consultations: Psychiatry, neurosurgery   Procedures/Studies: MR Lumbar Spine W Wo Contrast  Result Date: 07/11/2023 CLINICAL DATA:  post-op, foot drop EXAM: MRI LUMBAR SPINE WITHOUT AND WITH CONTRAST TECHNIQUE: Multiplanar and multiecho pulse sequences of the lumbar spine were obtained without and with intravenous contrast. CONTRAST:  8mL GADAVIST GADOBUTROL 1 MMOL/ML IV SOLN COMPARISON:  MR L Spine 06/17/23. FINDINGS: Segmentation:  Standard. Alignment:  Physiologic. Vertebrae: No fracture, evidence of discitis, or bone lesion. L5-S1 laminectomy. Conus medullaris and cauda equina: Conus extends to the L1 level. Conus and cauda equina appear normal. Paraspinal and other soft tissues: Negative. Disc levels: T12-L1: Unremarkable L1-L2: Unremarkable L2-L3: Eccentric left disc bulge. Mild bilateral facet degenerative change. No spinal canal narrowing. Mild left neural foraminal narrowing. L3-L4: Eccentric left disc bulge. Mild bilateral facet degenerative change. No spinal canal narrowing. Mild left neural foraminal narrowing. L4-L5: Redemonstrated central disc extrusion. There is persistent narrowing of the bilateral lateral recesses, slightly improved from prior exam. Left laminectomy. Mild overall spinal canal narrowing. Moderate bilateral facet degenerative change. Mild right neural foraminal narrowing. L5-S1: Left laminectomy. Eccentric left disc bulge. Mild bilateral facet  degenerative change. Mild-to-moderate left and mild right neural foraminal narrowing. There is contrast-enhancing soft tissue along the dorsal lateral and ventral lateral left aspect of the spinal canal at the L5-S1 level (see screen shot). This likely represents postsurgical change. IMPRESSION: 1. Postsurgical changes of left L4-L5 and L5-S1 laminectomies. Contrast-enhancing soft tissue along the dorsal lateral and ventral lateral left aspect of the spinal canal at the L5-S1 level likely represents evolving postsurgical change versus scar tissue. 2. Redemonstrated central disc extrusion at L4-L5 with persistent narrowing of the bilateral lateral recesses, slightly improved from prior exam. 3. Mild-to-moderate left and mild right neural foraminal narrowing at L5-S1. Electronically Signed   By: Lorenza Cambridge M.D.   On: 07/11/2023 10:52   DG Lumbar Spine 2-3 Views  Result Date: 07/02/2023 CLINICAL DATA:  Left lumbar micro discectomy at L4-5 and L5-S1. EXAM: LUMBAR SPINE - 2-3 VIEW COMPARISON:  Radiographs 03/19/2022.  MRI 06/17/2023. FINDINGS: Two cross-table lateral views of the lumbar spine are submitted postoperatively from  the operating room. The 1st image at 1426 hours demonstrates a posterior localizing needle over the L4 spinous process. The 2nd image at 1439 hours demonstrates skin spreaders overlying the posterior elements at L5. There is a surgical instrument directed towards the posterior aspect of the L4-5 disc space. IMPRESSION: Intraoperative localizing views of the lumbar spine as described. Electronically Signed   By: Carey Bullocks M.D.   On: 07/02/2023 18:47     Subjective: Patient agitated aggressive with staff overnight threatening self-harm, now appears to be back to baseline.  Patient reports ongoing anxiety and back pain but review of systems otherwise negative   Discharge Exam: Vitals:   07/17/23 0419 07/17/23 0908  BP: 113/69 (!) 125/96  Pulse: 68 85  Resp:  20  Temp: 97.6 F  (36.4 C) 97.7 F (36.5 C)  SpO2: 99% 96%   Vitals:   07/16/23 1612 07/16/23 2132 07/17/23 0419 07/17/23 0908  BP: (!) 135/99 (!) 143/84 113/69 (!) 125/96  Pulse: (!) 50 78 68 85  Resp: 17   20  Temp: 98.2 F (36.8 C) 98.1 F (36.7 C) 97.6 F (36.4 C) 97.7 F (36.5 C)  TempSrc: Oral Oral Oral Oral  SpO2: 96% 98% 99% 96%  Weight:      Height:        General: Pt is alert, awake, not in acute distress Cardiovascular: RRR, S1/S2 +, no rubs, no gallops Respiratory: CTA bilaterally, no wheezing, no rhonchi Abdominal: Soft, NT, ND, bowel sounds + Extremities: no edema, no cyanosis   The results of significant diagnostics from this hospitalization (including imaging, microbiology, ancillary and laboratory) are listed below for reference.     Microbiology: No results found for this or any previous visit (from the past 240 hour(s)).   Labs: BNP (last 3 results) No results for input(s): "BNP" in the last 8760 hours. Basic Metabolic Panel: Recent Labs  Lab 07/12/23 0114 07/13/23 0458 07/14/23 0346 07/15/23 0129 07/16/23 0429  NA 130* 129* 130* 129* 132*  K 3.9 4.0 4.0 4.1 3.9  CL 96* 95* 95* 97* 98  CO2 22 23 24 22 23   GLUCOSE 228* 232* 219* 246* 180*  BUN 21 23 27* 24* 20  CREATININE 0.75 0.94 0.79 0.87 1.05  CALCIUM 8.5* 9.0 9.0 9.1 9.5  MG  --   --   --  2.2 2.3  PHOS  --   --   --  2.5 2.9   Liver Function Tests: Recent Labs  Lab 07/11/23 0711  AST 40  ALT 121*  ALKPHOS 114  BILITOT 0.7  PROT 7.0  ALBUMIN 3.8   No results for input(s): "LIPASE", "AMYLASE" in the last 168 hours. No results for input(s): "AMMONIA" in the last 168 hours. CBC: Recent Labs  Lab 07/11/23 0711 07/12/23 0114 07/13/23 0458 07/14/23 0346 07/15/23 0129 07/16/23 0429  WBC 7.4 11.7* 10.7* 13.9* 14.0* 13.4*  NEUTROABS 4.2  --   --   --   --   --   HGB 15.3 13.6 14.1 14.1 13.8 14.7  HCT 43.8 38.5* 40.9 41.1 40.1 43.1  MCV 88.7 89.3 90.3 90.7 92.4 90.2  PLT 244 201 219 225  218 239   Cardiac Enzymes: No results for input(s): "CKTOTAL", "CKMB", "CKMBINDEX", "TROPONINI" in the last 168 hours. BNP: Invalid input(s): "POCBNP" CBG: Recent Labs  Lab 07/15/23 2111 07/16/23 0821 07/16/23 1205 07/16/23 1620 07/16/23 2010  GLUCAP 345* 207* 221* 215* 198*   D-Dimer No results for input(s): "DDIMER" in the last  72 hours. Hgb A1c No results for input(s): "HGBA1C" in the last 72 hours. Lipid Profile No results for input(s): "CHOL", "HDL", "LDLCALC", "TRIG", "CHOLHDL", "LDLDIRECT" in the last 72 hours. Thyroid function studies No results for input(s): "TSH", "T4TOTAL", "T3FREE", "THYROIDAB" in the last 72 hours.  Invalid input(s): "FREET3" Anemia work up No results for input(s): "VITAMINB12", "FOLATE", "FERRITIN", "TIBC", "IRON", "RETICCTPCT" in the last 72 hours. Urinalysis    Component Value Date/Time   COLORURINE YELLOW 07/04/2018 0258   APPEARANCEUR HAZY (A) 07/04/2018 0258   LABSPEC 1.017 07/04/2018 0258   PHURINE 6.0 07/04/2018 0258   GLUCOSEU NEGATIVE 07/04/2018 0258   HGBUR SMALL (A) 07/04/2018 0258   BILIRUBINUR NEGATIVE 07/04/2018 0258   KETONESUR NEGATIVE 07/04/2018 0258   PROTEINUR NEGATIVE 07/04/2018 0258   UROBILINOGEN 0.2 02/15/2013 0055   NITRITE POSITIVE (A) 07/04/2018 0258   LEUKOCYTESUR TRACE (A) 07/04/2018 0258   Sepsis Labs Recent Labs  Lab 07/13/23 0458 07/14/23 0346 07/15/23 0129 07/16/23 0429  WBC 10.7* 13.9* 14.0* 13.4*   Microbiology No results found for this or any previous visit (from the past 240 hour(s)).   Time coordinating discharge: Over 30 minutes  SIGNED:   Azucena Fallen, DO [facility] 07/17/2023, 11:05 AM Pager   If 7PM-7AM, please contact night-coverage [email or web address]

## 2023-07-17 NOTE — TOC Progression Note (Addendum)
Transition of Care [facility]) - Progression Note    Patient Details  Name: Raymond Melton MRN: [id number] Date of Birth: February 02, 1956  [facility] CM/SW Contact  Lorri Frederick, LCSW Phone Number: 07/17/2023, 9:41 AM  Clinical Narrative:   [facility] offers peer to peer prior to SNF auth decision: by 1330 today.  [phone], option 5.  MD informed.    1100: Per MD, P2P denied SNF.  Pt now agreeable to home health.    Expected Discharge Plan: Skilled Nursing Facility Barriers to Discharge: SNF Pending bed offer, Continued Medical Work up, English as a second language teacher  Expected Discharge Plan and Services In-house Referral: Clinical Social Work     Living arrangements for the past 2 months: Single Family Home                                       Social Determinants of Health (SDOH) Interventions SDOH Screenings   Food Insecurity: Patient Declined (07/11/2023)  Housing: Patient Declined (07/11/2023)  Transportation Needs: Patient Declined (07/11/2023)  Utilities: Not At Risk (07/11/2023)  Alcohol Screen: Low Risk  (12/23/2017)  Recent Concern: Alcohol Screen - Medium Risk (10/03/2017)  Tobacco Use: Medium Risk (07/11/2023)    Readmission Risk Interventions     No data to display

## 2023-07-17 NOTE — Progress Notes (Signed)
Pt from beginning of shift has been verbally aggressive towards staff and demanding. Pt given morning meds, This nurse attempted to assist with AFO device, pt became belligerent , questioning every attempt to help patient. Contacted OT to assist and re-educate patient on application of OT. Reva Bores 07/17/23 9:34 AM

## 2023-07-17 NOTE — Progress Notes (Addendum)
Occupational Therapy Treatment/Discharge Patient Details Name: Raymond Melton MRN: [id number] DOB: 15-Feb-1956 Today's Date: 07/17/2023   History of present illness 67 y.o. male [facility] to [facility] with worsening symptoms of LLE radiculopathy after recent L4-S1 discectomies. Repeat discectomy on 8/16. PMH includes alcohol abuse, anxiety, OA, depression, DM, GERD, hepatitis C, HTN, TB.   OT comments  Pt in good spirits, eager to return home. Pt currently mod I for all activities, displays good safety awareness. Pt instructed on proper donning/doffing AFO, and instructed on safe mobility at home. Post acute recommendation to include HHOT for safety in the home as Pt believes he needs assistance to set up home safely with back precautions. Pt as no further acute OT needs.       If plan is discharge home, recommend the following:  Assist for transportation;Assistance with cooking/housework;A little help with walking and/or transfers;A little help with bathing/dressing/bathroom;Help with stairs or ramp for entrance   Equipment Recommendations  None recommended by OT    Recommendations for Other Services      Precautions / Restrictions Precautions Precautions: Back Precaution Booklet Issued: Yes (comment) Precaution Comments: has handout Restrictions Weight Bearing Restrictions: No       Mobility Bed Mobility Overal bed mobility: Modified Independent                  Transfers Overall transfer level: Modified independent Equipment used: Rollator (4 wheels) Transfers: Sit to/from Stand, Bed to chair/wheelchair/BSC Sit to Stand: Modified independent (Device/Increase time)                 Balance Overall balance assessment: Modified Independent                                         ADL either performed or assessed with clinical judgement   ADL Overall ADL's : Modified independent                                        General ADL Comments: mod I with AFO, no physical assistance needed, able to ambulate with/without rollator. Pt displays good safety awareness, instructed on donning/doffing AFO.    Extremity/Trunk Assessment Upper Extremity Assessment Upper Extremity Assessment: Overall WFL for tasks assessed            Vision       Perception     Praxis      Cognition Arousal: Alert Behavior During Therapy: WFL for tasks assessed/performed Overall Cognitive Status: Within Functional Limits for tasks assessed                                          Exercises      Shoulder Instructions       General Comments      Pertinent Vitals/ Pain       Pain Assessment Pain Assessment: Faces Pain Score: 2  Pain Location: lower back Pain Descriptors / Indicators: Aching, Sore Pain Intervention(s): Monitored during session  Home Living Family/patient expects to be discharged to:: Private residence Living Arrangements: Spouse/significant other  Prior Functioning/Environment              Frequency  Min 1X/week        Progress Toward Goals  OT Goals(current goals can now be found in the care plan section)  Progress towards OT goals: Progressing toward goals  Acute Rehab OT Goals Patient Stated Goal: Pt eager to return home and to PLOF OT Goal Formulation: With patient Time For Goal Achievement: 07/27/23 Potential to Achieve Goals: Good ADL Goals Pt Will Perform Lower Body Dressing: with modified independence;sitting/lateral leans;sit to/from stand Pt Will Transfer to Toilet: with modified independence;ambulating;regular height toilet Pt/caregiver will Perform Home Exercise Program: Increased strength;Both right and left upper extremity;With written HEP provided Additional ADL Goal #1: Pt will safell navigate environment with LRAD and no cues for avoiding bumping into objects on L as well as no cues for  safety Additional ADL Goal #2: Pt will identify 3+ fall prevention strategies.  Plan      Co-evaluation                 AM-PAC OT "6 Clicks" Daily Activity     Outcome Measure   Help from another person eating meals?: None Help from another person taking care of personal grooming?: None Help from another person toileting, which includes using toliet, bedpan, or urinal?: None Help from another person bathing (including washing, rinsing, drying)?: None Help from another person to put on and taking off regular upper body clothing?: None Help from another person to put on and taking off regular lower body clothing?: None 6 Click Score: 24    End of Session Equipment Utilized During Treatment: Rollator (4 wheels)  OT Visit Diagnosis: Unsteadiness on feet (R26.81);Pain   Activity Tolerance Patient tolerated treatment well   Patient Left in bed;with call bell/phone within reach   Nurse Communication Mobility status        Time: 0981-1914 OT Time Calculation (min): 16 min  Charges: OT General Charges $OT Visit: 1 Visit OT Treatments $Self Care/Home Management : 8-22 mins  Kirk, OTR/L   Raymond Melton 07/17/2023, 12:01 PM

## 2023-07-21 LAB — GLUCOSE, CAPILLARY
Glucose-Capillary: 196 mg/dL — ABNORMAL HIGH (ref 70–99)
Glucose-Capillary: 269 mg/dL — ABNORMAL HIGH (ref 70–99)
Glucose-Capillary: 330 mg/dL — ABNORMAL HIGH (ref 70–99)
Glucose-Capillary: 203 mg/dL — ABNORMAL HIGH (ref 70–99)

## 2023-10-28 ENCOUNTER — Encounter (HOSPITAL_BASED_OUTPATIENT_CLINIC_OR_DEPARTMENT_OTHER): Payer: Self-pay

## 2023-10-28 ENCOUNTER — Ambulatory Visit (HOSPITAL_BASED_OUTPATIENT_CLINIC_OR_DEPARTMENT_OTHER)
Admission: EM | Admit: 2023-10-28 | Discharge: 2023-10-28 | Disposition: A | Discharge status: Home / Self Care | Payer: Medicare Other

## 2023-10-28 DIAGNOSIS — Z9889 Other specified postprocedural states: Secondary | ICD-10-CM

## 2023-10-28 DIAGNOSIS — R202 Paresthesia of skin: Secondary | ICD-10-CM
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
## 2023-11-03 ENCOUNTER — Telehealth: Payer: Self-pay | Admitting: Cardiovascular Disease

## 2023-11-03 NOTE — Telephone Encounter (Signed)
  Pt c/o of Chest Pain: STAT if active CP, including tightness, pressure, jaw pain, radiating pain to shoulder/upper arm/back, CP unrelieved by Nitro. Symptoms reported of SOB, nausea, vomiting, sweating.  1. Are you having CP right now?   No  2. Are you experiencing any other symptoms (ex. SOB, nausea, vomiting, sweating)?   No  3. Is your CP continuous or coming and going?   Comes and goes  4. Have you taken Nitroglycerin?   No  5. How long have you been experiencing CP?  Patient stated the chest pain started about 3 weeks ago.  6. If NO CP at time of call then end call with telling Pt to call back or call 911 if Chest pain returns prior to return call from triage team.   Patient stated feels a little chest pain.

## 2023-11-03 NOTE — Telephone Encounter (Signed)
Spoke with pt, he reports chest pain in the center of his chest with no radiation. It can comes at rest or with exertion. He does not have SOB. He reports this has been going on for sometime. He has not taken NTG. Follow up scheduled for 12/04/23 and patient reports the time of the appointment is fine with him. ER precautions discussed.

## 2023-12-04 ENCOUNTER — Ambulatory Visit: Payer: Medicare Other | Admitting: Physician Assistant

## 2024-02-17 LAB — LAB REPORT - SCANNED
A1c: 6.1
Albumin/Creatinine Ratio, Urine, POC: 9
Creatinine, POC: 100.8 mg/dL
EGFR (Non-African Amer.): 103
Microalbumin, Urine: 0.09

## 2024-02-19 ENCOUNTER — Telehealth: Payer: Self-pay | Admitting: Cardiovascular Disease

## 2024-02-19 NOTE — Telephone Encounter (Signed)
 Attempted to call patient's wife back. No answer, and no voicemail set up.

## 2024-02-19 NOTE — Telephone Encounter (Signed)
 Patient returned RN's call regarding results.

## 2024-02-19 NOTE — Telephone Encounter (Signed)
 Received call from Selena Batten at [facility]- informed me that the patient called her and said that we have not received the fax. I explained that we have not received any fax. Gave her another fax number= [phone] to send it to. She said she would re-send this information.  Also, informed her that this patient was very anxious- thinking that he needs to be seen emergently. I am unsure of how the information was presented to the patient but he was very scared and felt he needed to be seen now. I asked that it would be great if when in the future, information is given to patient's that they specify that it is not emergent- if it was, then they would send them to the ER. To please be more mindful of how information is provided to patients. Maybe reassure them that it is not an emergency. Informed her that most times we cannot see patient's same day, anyway.  She said she would share this information with the provider.

## 2024-02-19 NOTE — Telephone Encounter (Signed)
 Gave this information to Dr Antoine Poche- DOD to review, since Dr Allyson Sabal is not in office at this time. He reports that it is not emergent, but does need f/u with Cardiologist. Go to ER if experienced severe shortness of breath.   Gave the printed faxes to [place], [place]- Dr Hazle Coca nurse.   Spoke with Clydie Braun (dpr) told of information received; reviewed by Cardiologist in office. Explained that it is non-emergent; he does need a f/u appointment, but this is not an emergency.  Appointment made with Dr Allyson Sabal for 03/03/24. Given ER precautions. Verbalized understanding.

## 2024-02-19 NOTE — Telephone Encounter (Signed)
 Pt's wife would like a c/b regarding recent Echo that he had done by PCP. Wife states that results came back abnormal and would like to speak with nurse. Please advise

## 2024-02-19 NOTE — Telephone Encounter (Signed)
 Pt called back, he reports that his PCP office called him and said that his Echo results are bad and that he needs to follow up with his Cardiologist as soon as possible. Told that they have faxed the Echo to our office 2 times.   I looked in our Fax [place] and did not find any faxes. I apologized to the patient. Patient was very angry/frustrated at the breakdown in communication between the two offices. I told him that I was sorry about the breakdown in communication. Informed him that if it was something emergent, then his provider would have told him to go to the ER. I told him that I would look in our fax QUE again and in the providers box to see if it is there. Put on hold, then the line accidentally disconnected.

## 2024-02-26 NOTE — Telephone Encounter (Signed)
 Attempted to call wife back. No voicemail set up.

## 2024-02-26 NOTE — Telephone Encounter (Signed)
 Runell Gess, MD  Scheryl Marten, RN    His echo showed normal LV systolic function, normal RV systolic function, dilated RV and normal valvular function.  I will discuss with him when he is here on 03/03/24.      Spoke with pt regarding echocardiogram results. Dr. Allyson Sabal reviewed these and Dr. Allyson Sabal response discussed with pt. Pt wants to mention mild back pain that doesn't go away, and would like to know if this would correlated with dilated RV. Advised pt that there is not typically pain with dilated RV. Pt verbalizes understand, no further questions at this time.

## 2024-03-03 ENCOUNTER — Ambulatory Visit: Attending: Cardiovascular Disease | Admitting: Cardiovascular Disease

## 2024-03-03 ENCOUNTER — Encounter: Payer: Self-pay | Admitting: Cardiovascular Disease

## 2024-03-03 VITALS — BP 110/64 | HR 68 | Ht 73.0 in | Wt 190.0 lb

## 2024-03-03 DIAGNOSIS — E782 Mixed hyperlipidemia: Secondary | ICD-10-CM

## 2024-03-03 DIAGNOSIS — I25750 Atherosclerosis of native coronary artery of transplanted heart with unstable angina: Secondary | ICD-10-CM

## 2024-03-03 DIAGNOSIS — I10 Essential (primary) hypertension: Secondary | ICD-10-CM | POA: Diagnosis not present

## 2024-03-03 DIAGNOSIS — R0600 Dyspnea, unspecified: Secondary | ICD-10-CM | POA: Diagnosis not present

## 2024-03-03 MED ORDER — NITROGLYCERIN 0.4 MG SL SUBL: 0.4000 mg | SUBLINGUAL_TABLET | SUBLINGUAL | 3 refills | Status: AC | PRN

## 2024-03-03 NOTE — Progress Notes (Signed)
 03/03/2024 Romyn Boswell [facility]   [birth year]  [id number]  Primary Physician Galvin Proffer, MD Primary Cardiologist: Runell Gess MD [name], [place], [place]  HPI:  Raymond Melton is a 68 y.o.    mildly overweight married Caucasian male with no children who currently does not work.  I last saw him in the office 05/01/2023.[name]  He has a long history of tobacco abuse smoking one half pack a day until recently when he dramatically reduce this. He does have a history of treated hypertension and diabetes. There is a strong family history of heart disease. He's never had a heart attack or stroke. He was admitted with unstable angina and underwent diagnostic coronary angiography by Dr. Katrinka Blazing on 02/05/18 revealed a high-grade obtuse marginal branch stenosis which was successfully stented using a synergy drug-eluting stent with noncritical disease otherwise and normal LV function. His chest pain has markedly improved since that time.    He had moved to [facility] for a year.  He apparently was hospitalized there and underwent diagnostic cath that did not reveal disease tight enough to warrant intervention.  He apparently was diagnosed with inguinal hernia and underwent elective hernia operation after I saw him in May 2021.  I had performed a Myoview stress test 04/13/2020 which was low risk and nonischemic.    He was getting fairly frequent chest pain when I saw him 6 months ago which she continues to get.   His wife did tell me that he is becoming more forgetful.  I suggested that he see his primary care doctor to be referred to a neurologist for formal neuropsychiatric/cognitive testing.   He did get neuropsychiatric testing but unfortunately did not get along well with his physician.  He did stop smoking 1/2 weeks ago.  He does get some pain in his left upper extremity which sounds orthopedic or neuropathic.   Since I saw him in the office 10 months ago he continues to do well.  He still gets  occasional chest pain and some shortness of breath.  He apparently had a 2D echo performed at his primary care's office 02/17/2024 that showed normal LV systolic function with dilated right ventricle and severely dilated right atrium.  There were no valvular abnormalities.  Interestingly, his echo performed in our office 04/07/2020 was entirely normal except for grade 1 diastolic dysfunction.  I am going to repeat the 2D echocardiogram.     Current Meds  Medication Sig   amLODipine (NORVASC) 10 MG tablet Take 10 mg by mouth daily.   aspirin EC 81 MG tablet Take 81 mg by mouth daily. Swallow whole.   clopidogrel (PLAVIX) 75 MG tablet Take 75 mg by mouth daily.   Dulaglutide (TRULICITY) 3 MG/0.5ML SOPN Inject 3 mg into the skin every Thursday.   gabapentin (NEURONTIN) 300 MG capsule Take 300 mg by mouth every evening.   ibuprofen (ADVIL) 400 MG tablet Take 1 tablet (400 mg total) by mouth every 4 (four) hours as needed for mild pain, moderate pain, fever or headache.   linaclotide (LINZESS) 72 MCG capsule Take 72 mcg by mouth daily as needed (no BM).   Melatonin 10 MG TABS Take 10 mg by mouth at bedtime as needed (sleep).   methylPREDNISolone (MEDROL DOSEPAK) 4 MG TBPK tablet Take as directed   metoprolol succinate (TOPROL-XL) 100 MG 24 hr tablet Take 1 tablet (100 mg total) by mouth daily. Take with or immediately following a meal: For high blood  pressure   ondansetron (ZOFRAN) 4 MG tablet Take 1 tablet (4 mg total) by mouth every 6 (six) hours as needed for nausea.   rosuvastatin (CRESTOR) 20 MG tablet Take 1 tablet (20 mg total) by mouth daily.   tiZANidine (ZANAFLEX) 4 MG tablet Take 4 mg by mouth 3 (three) times daily as needed for muscle spasms.   [DISCONTINUED] nitroGLYCERIN (NITROSTAT) 0.4 MG SL tablet Place 1 tablet (0.4 mg total) under the tongue every 5 (five) minutes x 3 doses as needed for chest pain.     Allergies  Allergen Reactions   Amipaque [Metrizamide] Anaphylaxis and Swelling    Iodinated Contrast Media Anaphylaxis and Swelling   Iodine Anaphylaxis   Iohexol Anaphylaxis and Swelling   Ioxaglate Anaphylaxis and Swelling   Other Anaphylaxis, Shortness Of Breath and Swelling    IV dye   Codeine Itching and Other (See Comments)    Restless    Ambien [Zolpidem] Other (See Comments)    Confusion    Diovan [Valsartan] Other (See Comments)    Unknown reaction   Glucophage [Metformin] Diarrhea   Latex Other (See Comments)    Unknown reaction   Reglan [Metoclopramide] Other (See Comments)    Unknown reaction   Remeron [Mirtazapine] Other (See Comments)    Unknown reaction   Tuberculin Tests Other (See Comments)    Severe skin reaction   Tuberculin, Ppd Other (See Comments)    Severe skin reaction   Zestril [Lisinopril] Other (See Comments)    Unknown reaction    Social History   Socioeconomic History   Marital status: Legally Separated    Spouse name: Not on file   Number of children: Not on file   Years of education: Not on file   Highest education level: Not on file  Occupational History   Not on file  Tobacco Use   Smoking status: Former    Current packs/day: 0.00    Types: Cigarettes    Quit date: 09/2021    Years since quitting: 2.4   Smokeless tobacco: Never  Vaping Use   Vaping status: Former  Substance and Sexual Activity   Alcohol use: Not Currently    Comment: denies   Drug use: No   Sexual activity: Not Currently  Other Topics Concern   Not on file  Social History Narrative   Right handed   Social Drivers of Health   Financial Resource Strain: Not on file  Food Insecurity: Patient Declined (07/11/2023)   Hunger Vital Sign    Worried About Running Out of Food in the Last Year: Patient declined    Ran Out of Food in the Last Year: Patient declined  Transportation Needs: Patient Declined (07/11/2023)   PRAPARE - Administrator, Civil Service (Medical): Patient declined    Lack of Transportation (Non-Medical):  Patient declined  Physical Activity: Not on file  Stress: Not on file  Social Connections: Not on file  Intimate Partner Violence: Patient Declined (07/11/2023)   Humiliation, Afraid, Rape, and Kick questionnaire    Fear of Current or Ex-Partner: Patient declined    Emotionally Abused: Patient declined    Physically Abused: Patient declined    Sexually Abused: Patient declined     Review of Systems: General: negative for chills, fever, night sweats or weight changes.  Cardiovascular: negative for chest pain, dyspnea on exertion, edema, orthopnea, palpitations, paroxysmal nocturnal dyspnea or shortness of breath Dermatological: negative for rash Respiratory: negative for cough or wheezing Urologic: negative for hematuria  Abdominal: negative for nausea, vomiting, diarrhea, bright red blood per rectum, melena, or hematemesis Neurologic: negative for visual changes, syncope, or dizziness All other systems reviewed and are otherwise negative except as noted above.    Blood pressure 110/64, pulse 68, height 6\' 1"  (1.854 m), weight 190 lb (86.2 kg), SpO2 95%.  General appearance: alert and no distress Neck: no adenopathy, no carotid bruit, no JVD, supple, symmetrical, trachea midline, and thyroid not enlarged, symmetric, no tenderness/mass/nodules Lungs: clear to auscultation bilaterally Heart: regular rate and rhythm, S1, S2 normal, no murmur, click, rub or gallop Extremities: extremities normal, atraumatic, no cyanosis or edema Pulses: 2+ and symmetric Skin: Skin color, texture, turgor normal. No rashes or lesions Neurologic: Grossly normal  EKG EKG Interpretation Date/Time:  Wednesday March 03 2024 11:52:12 EDT Ventricular Rate:  69 PR Interval:  218 QRS Duration:  86 QT Interval:  406 QTC Calculation: 435 R Axis:   26  Text Interpretation: Sinus rhythm with 1st degree A-V block When compared with ECG of 04-Jul-2018 07:49, No significant change was found Confirmed by Nanetta Batty [phone]) on 03/03/2024 11:53:07 AM    ASSESSMENT AND PLAN:   Hypertension History of essential hypertension blood pressure measured today 110/64.  He is on amlodipine, metoprolol.  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 Katrinka Blazing 02/05/2018 revealing a high-grade obtuse marginal branch stenosis which was successfully stented using a Synergy drug-eluting stent.  He otherwise had noncritical CAD and normal LV function.  He apparently had a diagnostic cath in [facility] after that but did not have disease that required intervention.  He had a negative Myoview stress test 04/13/2020 and again 03/13/23.  He gets occasional chest pain.  Hyperlipidemia History of hyperlipidemia on statin therapy with lipid profile performed 02/12/2024 revealing a total cholesterol of 83, LDL of 70 and HDL 49.     Runell Gess MD FACP,FACC,FAHA, [facility] 03/03/2024 12:05 PM

## 2024-03-03 NOTE — Assessment & Plan Note (Signed)
 History of hyperlipidemia on statin therapy with lipid profile performed 02/12/2024 revealing a total cholesterol of 83, LDL of 70 and HDL 49.

## 2024-03-03 NOTE — Assessment & Plan Note (Signed)
 History of essential hypertension blood pressure measured today 110/64.  He is on amlodipine, metoprolol.

## 2024-03-03 NOTE — Assessment & Plan Note (Signed)
 History of CAD status post cardiac catheterization by Dr. Katrinka Blazing 02/05/2018 revealing a high-grade obtuse marginal branch stenosis which was successfully stented using a Synergy drug-eluting stent.  He otherwise had noncritical CAD and normal LV function.  He apparently had a diagnostic cath in [facility] after that but did not have disease that required intervention.  He had a negative Myoview stress test 04/13/2020 and again 03/13/23.  He gets occasional chest pain.

## 2024-03-03 NOTE — Patient Instructions (Signed)
 Medication Instructions:  NO CHANGES  *If you need a refill on your cardiac medications before your next appointment, please call your pharmacy*  Testing/Procedures: Your physician has requested that you have an echocardiogram. Echocardiography is a painless test that uses sound waves to create images of your heart. It provides your doctor with information about the size and shape of your heart and how well your heart's chambers and valves are working. This procedure takes approximately one hour. There are no restrictions for this procedure. Please do NOT wear cologne, perfume, aftershave, or lotions (deodorant is allowed). Please arrive 15 minutes prior to your appointment time.  Please note: We ask at that you not bring children with you during ultrasound (echo/ vascular) testing. Due to room size and safety concerns, children are not allowed in the ultrasound rooms during exams. Our front office staff cannot provide observation of children in our lobby area while testing is being conducted. An adult accompanying a patient to their appointment will only be allowed in the ultrasound room at the discretion of the ultrasound technician under special circumstances. We apologize for any inconvenience.   Follow-Up: At [facility], you and your health needs are our priority.  As part of our continuing mission to provide you with exceptional heart care, our providers are all part of one team.  This team includes your primary Cardiologist (physician) and Advanced Practice Providers or APPs (Physician Assistants and Nurse Practitioners) who all work together to provide you with the care you need, when you need it.  Your next appointment:    12 months with Dr. Allyson Sabal   SOONER if echo is abnormal   We recommend signing up for the patient portal called "MyChart".  Sign up information is provided on this After Visit Summary.  MyChart is used to connect with patients for Virtual Visits  (Telemedicine).  Patients are able to view lab/test results, encounter notes, upcoming appointments, etc.  Non-urgent messages can be sent to your provider as well.   To learn more about what you can do with MyChart, go to [email or web address].        1st Floor: - Lobby - Registration  - Pharmacy  - Lab - Cafe  2nd Floor: - PV Lab - Diagnostic Testing (echo, CT, nuclear med)  3rd Floor: - Vacant  4th Floor: - TCTS (cardiothoracic surgery) - AFib Clinic - Structural Heart Clinic - Vascular Surgery  - Vascular Ultrasound  5th Floor: - HeartCare Cardiology (general and EP) - Clinical Pharmacy for coumadin, hypertension, lipid, weight-loss medications, and med management appointments    Valet parking services will be available as well.

## 2024-03-30 ENCOUNTER — Ambulatory Visit: Attending: Cardiovascular Disease

## 2024-03-30 DIAGNOSIS — R0600 Dyspnea, unspecified: Secondary | ICD-10-CM

## 2024-04-01 LAB — ECHOCARDIOGRAM COMPLETE
AR max vel: 1.94 cm2
AV Area VTI: 1.95 cm2
AV Area mean vel: 1.77 cm2
AV Mean grad: 3.5 mmHg
AV Peak grad: 6.2 mmHg
Ao pk vel: 1.24 m/s
Area-P 1/2: 3.88 cm2
MV M vel: 2.54 m/s
MV Peak grad: 25.8 mmHg
S' Lateral: 2.9 cm

## 2024-04-05 ENCOUNTER — Telehealth: Payer: Self-pay | Admitting: Cardiovascular Disease

## 2024-04-05 NOTE — Telephone Encounter (Signed)
 Pt calling to get his Echo results

## 2024-04-05 NOTE — Telephone Encounter (Signed)
 Left voice message with results per DPR. Pt told to call us  with any questions.

## 2024-04-08 ENCOUNTER — Ambulatory Visit: Payer: Self-pay | Admitting: *Deleted

## 2024-04-23 ENCOUNTER — Ambulatory Visit (HOSPITAL_BASED_OUTPATIENT_CLINIC_OR_DEPARTMENT_OTHER): Admitting: Family Medicine

## 2024-04-23 DIAGNOSIS — K2100 Gastro-esophageal reflux disease with esophagitis, without bleeding: Secondary | ICD-10-CM | POA: Insufficient documentation

## 2024-04-23 DIAGNOSIS — L931 Subacute cutaneous lupus erythematosus: Secondary | ICD-10-CM | POA: Insufficient documentation

## 2024-04-23 DIAGNOSIS — G4700 Insomnia, unspecified: Secondary | ICD-10-CM | POA: Insufficient documentation

## 2024-04-23 DIAGNOSIS — I700 Atherosclerosis of aorta: Secondary | ICD-10-CM

## 2024-04-23 DIAGNOSIS — Z8673 Personal history of transient ischemic attack (TIA), and cerebral infarction without residual deficits: Secondary | ICD-10-CM | POA: Insufficient documentation

## 2024-04-23 DIAGNOSIS — J301 Allergic rhinitis due to pollen: Secondary | ICD-10-CM | POA: Insufficient documentation

## 2024-04-23 DIAGNOSIS — R269 Unspecified abnormalities of gait and mobility: Secondary | ICD-10-CM | POA: Insufficient documentation

## 2024-04-23 DIAGNOSIS — M5116 Intervertebral disc disorders with radiculopathy, lumbar region: Secondary | ICD-10-CM | POA: Insufficient documentation

## 2024-04-23 DIAGNOSIS — J449 Chronic obstructive pulmonary disease, unspecified: Secondary | ICD-10-CM | POA: Insufficient documentation

## 2024-04-23 DIAGNOSIS — I872 Venous insufficiency (chronic) (peripheral): Secondary | ICD-10-CM | POA: Insufficient documentation

## 2024-04-23 DIAGNOSIS — G451 Carotid artery syndrome (hemispheric): Secondary | ICD-10-CM | POA: Insufficient documentation

## 2024-04-23 DIAGNOSIS — K469 Unspecified abdominal hernia without obstruction or gangrene: Secondary | ICD-10-CM | POA: Insufficient documentation

## 2024-04-23 DIAGNOSIS — R7301 Impaired fasting glucose: Secondary | ICD-10-CM | POA: Insufficient documentation

## 2024-04-23 DIAGNOSIS — I639 Cerebral infarction, unspecified: Secondary | ICD-10-CM | POA: Insufficient documentation

## 2024-04-23 DIAGNOSIS — U071 COVID-19: Secondary | ICD-10-CM | POA: Insufficient documentation

## 2024-04-23 DIAGNOSIS — K5900 Constipation, unspecified: Secondary | ICD-10-CM | POA: Insufficient documentation

## 2024-04-23 DIAGNOSIS — M62830 Muscle spasm of back: Secondary | ICD-10-CM | POA: Insufficient documentation

## 2024-04-23 DIAGNOSIS — D518 Other vitamin B12 deficiency anemias: Secondary | ICD-10-CM | POA: Insufficient documentation

## 2024-04-23 DIAGNOSIS — R609 Edema, unspecified: Secondary | ICD-10-CM | POA: Insufficient documentation

## 2024-04-23 DIAGNOSIS — Z125 Encounter for screening for malignant neoplasm of prostate: Secondary | ICD-10-CM | POA: Insufficient documentation

## 2024-04-23 DIAGNOSIS — K219 Gastro-esophageal reflux disease without esophagitis: Secondary | ICD-10-CM | POA: Insufficient documentation

## 2024-04-23 DIAGNOSIS — M064 Inflammatory polyarthropathy: Secondary | ICD-10-CM | POA: Insufficient documentation

## 2024-04-23 DIAGNOSIS — Z79899 Other long term (current) drug therapy: Secondary | ICD-10-CM | POA: Insufficient documentation

## 2024-04-23 DIAGNOSIS — E1165 Type 2 diabetes mellitus with hyperglycemia: Secondary | ICD-10-CM | POA: Insufficient documentation

## 2024-04-23 DIAGNOSIS — N521 Erectile dysfunction due to diseases classified elsewhere: Secondary | ICD-10-CM | POA: Insufficient documentation

## 2024-04-23 DIAGNOSIS — Z9981 Dependence on supplemental oxygen: Secondary | ICD-10-CM | POA: Insufficient documentation

## 2024-04-23 DIAGNOSIS — E559 Vitamin D deficiency, unspecified: Secondary | ICD-10-CM | POA: Insufficient documentation

## 2024-04-23 DIAGNOSIS — E039 Hypothyroidism, unspecified: Secondary | ICD-10-CM | POA: Insufficient documentation

## 2024-04-23 DIAGNOSIS — B370 Candidal stomatitis: Secondary | ICD-10-CM | POA: Insufficient documentation

## 2024-04-23 DIAGNOSIS — D692 Other nonthrombocytopenic purpura: Secondary | ICD-10-CM | POA: Insufficient documentation

## 2024-04-23 DIAGNOSIS — M19049 Primary osteoarthritis, unspecified hand: Secondary | ICD-10-CM | POA: Insufficient documentation

## 2024-04-23 DIAGNOSIS — R911 Solitary pulmonary nodule: Secondary | ICD-10-CM | POA: Insufficient documentation

## 2024-04-23 DIAGNOSIS — R059 Cough, unspecified: Secondary | ICD-10-CM | POA: Insufficient documentation

## 2024-04-23 DIAGNOSIS — N400 Enlarged prostate without lower urinary tract symptoms: Secondary | ICD-10-CM | POA: Insufficient documentation

## 2024-04-23 DIAGNOSIS — G5760 Lesion of plantar nerve, unspecified lower limb: Secondary | ICD-10-CM | POA: Insufficient documentation

## 2024-04-23 DIAGNOSIS — S8262XA Displaced fracture of lateral malleolus of left fibula, initial encounter for closed fracture: Secondary | ICD-10-CM | POA: Insufficient documentation

## 2024-07-22 ENCOUNTER — Telehealth: Payer: Self-pay | Admitting: Cardiovascular Disease

## 2024-07-22 NOTE — Telephone Encounter (Signed)
 Dr. Marlis office is calling to get the patients last echo (03/30/24) faxed to them.   Please fax to [phone]  Attn: Dr. Debarah

## 2024-07-22 NOTE — Telephone Encounter (Signed)
 Route  to [employer]   group  via epic.
